# Patient Record
Sex: Female | Born: 1945
Health system: Southern US, Community
[De-identification: ages and names within clinical notes are randomized; demographics above are authoritative.]

## PROBLEM LIST (undated history)

## (undated) ENCOUNTER — Ambulatory Visit: Admission: EM | Payer: Medicare HMO

## (undated) DIAGNOSIS — I2510 Atherosclerotic heart disease of native coronary artery without angina pectoris: Secondary | ICD-10-CM

## (undated) DIAGNOSIS — L409 Psoriasis, unspecified: Secondary | ICD-10-CM

## (undated) DIAGNOSIS — I214 Non-ST elevation (NSTEMI) myocardial infarction: Secondary | ICD-10-CM

## (undated) DIAGNOSIS — I509 Heart failure, unspecified: Secondary | ICD-10-CM

## (undated) DIAGNOSIS — I447 Left bundle-branch block, unspecified: Secondary | ICD-10-CM

## (undated) DIAGNOSIS — E785 Hyperlipidemia, unspecified: Secondary | ICD-10-CM

## (undated) DIAGNOSIS — T4145XA Adverse effect of unspecified anesthetic, initial encounter: Secondary | ICD-10-CM

## (undated) DIAGNOSIS — T8859XA Other complications of anesthesia, initial encounter: Secondary | ICD-10-CM

## (undated) HISTORY — DX: Atherosclerotic heart disease of native coronary artery without angina pectoris: I25.10

## (undated) HISTORY — DX: Heart failure, unspecified: I50.9

## (undated) HISTORY — DX: Hyperlipidemia, unspecified: E78.5

## (undated) HISTORY — DX: Non-ST elevation (NSTEMI) myocardial infarction: I21.4

## (undated) HISTORY — DX: Left bundle-branch block, unspecified: I44.7

## (undated) HISTORY — PX: NO PAST SURGERIES: SHX2092

---

## 2012-02-16 ENCOUNTER — Ambulatory Visit: Payer: Self-pay

## 2012-02-16 ENCOUNTER — Inpatient Hospital Stay: Payer: Self-pay | Admitting: Student

## 2012-02-16 LAB — CBC
HGB: 11 g/dL — ABNORMAL LOW (ref 12.0–16.0)
MCH: 25.1 pg — ABNORMAL LOW (ref 26.0–34.0)
MCHC: 31.7 g/dL — ABNORMAL LOW (ref 32.0–36.0)
MCV: 79 fL — ABNORMAL LOW (ref 80–100)
Platelet: 408 10*3/uL (ref 150–440)
RBC: 4.37 10*6/uL (ref 3.80–5.20)

## 2012-02-16 LAB — URINALYSIS, COMPLETE
Bilirubin,UR: NEGATIVE
Glucose,UR: NEGATIVE mg/dL (ref 0–75)
Nitrite: NEGATIVE
Ph: 5 (ref 4.5–8.0)
Protein: >=500
Specific Gravity: 1.018 (ref 1.003–1.030)
Squamous Epithelial: 16

## 2012-02-16 LAB — APTT: Activated PTT: 26.3 secs (ref 23.6–35.9)

## 2012-02-16 LAB — TROPONIN I
Troponin-I: 0.07 ng/mL — ABNORMAL HIGH
Troponin-I: 0.1 ng/mL — ABNORMAL HIGH

## 2012-02-16 LAB — CK TOTAL AND CKMB (NOT AT ARMC): CK, Total: 176 U/L (ref 21–215)

## 2012-02-16 LAB — COMPREHENSIVE METABOLIC PANEL
Alkaline Phosphatase: 104 U/L (ref 50–136)
Bilirubin,Total: 1.1 mg/dL — ABNORMAL HIGH (ref 0.2–1.0)
Co2: 23 mmol/L (ref 21–32)
Creatinine: 0.84 mg/dL (ref 0.60–1.30)
EGFR (Non-African Amer.): >60
Osmolality: 291 (ref 275–301)
SGPT (ALT): 47 U/L (ref 12–78)
Sodium: 145 mmol/L (ref 136–145)
Total Protein: 7 g/dL (ref 6.4–8.2)

## 2012-02-16 LAB — PROTIME-INR: Prothrombin Time: 14.2 secs (ref 11.5–14.7)

## 2012-02-16 LAB — PRO B NATRIURETIC PEPTIDE: B-Type Natriuretic Peptide: 25574 pg/mL — ABNORMAL HIGH (ref 0–125)

## 2012-02-17 ENCOUNTER — Encounter (HOSPITAL_COMMUNITY): Payer: Self-pay | Admitting: Physician Assistant

## 2012-02-17 ENCOUNTER — Encounter (HOSPITAL_COMMUNITY): Payer: Self-pay | Admitting: Cardiology

## 2012-02-17 ENCOUNTER — Inpatient Hospital Stay (HOSPITAL_COMMUNITY)
Admission: EM | Admit: 2012-02-17 | Discharge: 2012-02-29 | DRG: 235 | Disposition: A | Discharge status: Home / Self Care | Payer: Medicare Other | Source: Other Acute Inpatient Hospital | Attending: Thoracic Surgery (Cardiothoracic Vascular Surgery) | Admitting: Thoracic Surgery (Cardiothoracic Vascular Surgery)

## 2012-02-17 DIAGNOSIS — D62 Acute posthemorrhagic anemia: Secondary | ICD-10-CM | POA: Diagnosis not present

## 2012-02-17 DIAGNOSIS — L408 Other psoriasis: Secondary | ICD-10-CM | POA: Diagnosis present

## 2012-02-17 DIAGNOSIS — Z951 Presence of aortocoronary bypass graft: Secondary | ICD-10-CM

## 2012-02-17 DIAGNOSIS — I255 Ischemic cardiomyopathy: Secondary | ICD-10-CM

## 2012-02-17 DIAGNOSIS — Z720 Tobacco use: Secondary | ICD-10-CM

## 2012-02-17 DIAGNOSIS — I5021 Acute systolic (congestive) heart failure: Secondary | ICD-10-CM

## 2012-02-17 DIAGNOSIS — E611 Iron deficiency: Secondary | ICD-10-CM

## 2012-02-17 DIAGNOSIS — J9819 Other pulmonary collapse: Secondary | ICD-10-CM | POA: Diagnosis not present

## 2012-02-17 DIAGNOSIS — I2510 Atherosclerotic heart disease of native coronary artery without angina pectoris: Principal | ICD-10-CM

## 2012-02-17 DIAGNOSIS — I059 Rheumatic mitral valve disease, unspecified: Secondary | ICD-10-CM | POA: Diagnosis present

## 2012-02-17 DIAGNOSIS — E162 Hypoglycemia, unspecified: Secondary | ICD-10-CM | POA: Diagnosis not present

## 2012-02-17 DIAGNOSIS — I200 Unstable angina: Secondary | ICD-10-CM | POA: Diagnosis present

## 2012-02-17 DIAGNOSIS — I340 Nonrheumatic mitral (valve) insufficiency: Secondary | ICD-10-CM

## 2012-02-17 DIAGNOSIS — I428 Other cardiomyopathies: Secondary | ICD-10-CM | POA: Diagnosis present

## 2012-02-17 DIAGNOSIS — I2589 Other forms of chronic ischemic heart disease: Secondary | ICD-10-CM | POA: Diagnosis present

## 2012-02-17 DIAGNOSIS — I509 Heart failure, unspecified: Secondary | ICD-10-CM | POA: Diagnosis present

## 2012-02-17 DIAGNOSIS — I249 Acute ischemic heart disease, unspecified: Secondary | ICD-10-CM

## 2012-02-17 DIAGNOSIS — Z8249 Family history of ischemic heart disease and other diseases of the circulatory system: Secondary | ICD-10-CM

## 2012-02-17 DIAGNOSIS — I447 Left bundle-branch block, unspecified: Secondary | ICD-10-CM

## 2012-02-17 DIAGNOSIS — F17200 Nicotine dependence, unspecified, uncomplicated: Secondary | ICD-10-CM | POA: Diagnosis present

## 2012-02-17 HISTORY — DX: Other complications of anesthesia, initial encounter: T88.59XA

## 2012-02-17 HISTORY — PX: CARDIAC CATHETERIZATION: SHX172

## 2012-02-17 HISTORY — DX: Psoriasis, unspecified: L40.9

## 2012-02-17 HISTORY — DX: Adverse effect of unspecified anesthetic, initial encounter: T41.45XA

## 2012-02-17 LAB — CBC WITH DIFFERENTIAL/PLATELET
Lymphocyte #: 1.7 10*3/uL (ref 1.0–3.6)
MCH: 25.4 pg — ABNORMAL LOW (ref 26.0–34.0)
MCHC: 32 g/dL (ref 32.0–36.0)
MCV: 79 fL — ABNORMAL LOW (ref 80–100)
Monocyte #: 0.6 x10 3/mm (ref 0.2–0.9)
Monocyte %: 7.4 %
Platelet: 339 10*3/uL (ref 150–440)
RDW: 17.8 % — ABNORMAL HIGH (ref 11.5–14.5)
WBC: 8.6 10*3/uL (ref 3.6–11.0)

## 2012-02-17 LAB — BASIC METABOLIC PANEL
BUN: 20 mg/dL — ABNORMAL HIGH (ref 7–18)
CO2: 29 mEq/L (ref 19–32)
Calcium, Total: 8.4 mg/dL — ABNORMAL LOW (ref 8.5–10.1)
Calcium: 9.3 mg/dL (ref 8.4–10.5)
Chloride: 106 mmol/L (ref 98–107)
Creatinine, Ser: 0.92 mg/dL (ref 0.50–1.10)
Osmolality: 288 (ref 275–301)
Potassium: 3.5 mmol/L (ref 3.5–5.1)

## 2012-02-17 LAB — CBC
Hemoglobin: 10.8 g/dL — ABNORMAL LOW (ref 12.0–15.0)
RBC: 4.38 MIL/uL (ref 3.87–5.11)
WBC: 10.1 10*3/uL (ref 4.0–10.5)

## 2012-02-17 LAB — URINALYSIS, MICROSCOPIC ONLY
Bilirubin Urine: NEGATIVE
Protein, ur: NEGATIVE mg/dL
Urobilinogen, UA: 1 mg/dL (ref 0.0–1.0)

## 2012-02-17 LAB — LIPID PANEL
Cholesterol: 123 mg/dL (ref 0–200)
Ldl Cholesterol, Calc: 73 mg/dL (ref 0–100)
Triglycerides: 99 mg/dL (ref 0–200)

## 2012-02-17 LAB — MAGNESIUM: Magnesium: 1.4 mg/dL — ABNORMAL LOW

## 2012-02-17 LAB — RETICULOCYTES: Retic Count, Absolute: 96.4 10*3/uL (ref 19.0–186.0)

## 2012-02-17 LAB — CK TOTAL AND CKMB (NOT AT ARMC): CK, Total: 168 U/L (ref 21–215)

## 2012-02-17 LAB — PRO B NATRIURETIC PEPTIDE: Pro B Natriuretic peptide (BNP): 17774 pg/mL — ABNORMAL HIGH (ref 0–125)

## 2012-02-17 LAB — MRSA PCR SCREENING: MRSA by PCR: NEGATIVE

## 2012-02-17 MED ORDER — ACETAMINOPHEN 325 MG PO TABS: 650.0000 mg | ORAL_TABLET | ORAL | Status: DC | PRN

## 2012-02-17 MED ORDER — FERROUS SULFATE 325 (65 FE) MG PO TABS
325.0000 mg | ORAL_TABLET | Freq: Three times a day (TID) | ORAL | Status: DC
  Administered 2012-02-17 – 2012-02-29 (×30): 325 mg via ORAL
  Filled 2012-02-17 (×38): qty 1

## 2012-02-17 MED ORDER — SODIUM CHLORIDE 0.9 % IV SOLN: 250.0000 mL | INTRAVENOUS | Status: DC | PRN

## 2012-02-17 MED ORDER — SODIUM CHLORIDE 0.9 % IJ SOLN: 3.0000 mL | INTRAMUSCULAR | Status: DC | PRN

## 2012-02-17 MED ORDER — LISINOPRIL 10 MG PO TABS
10.0000 mg | ORAL_TABLET | Freq: Every day | ORAL | Status: DC
  Administered 2012-02-17 – 2012-02-22 (×6): 10 mg via ORAL
  Filled 2012-02-17 (×7): qty 1

## 2012-02-17 MED ORDER — ONDANSETRON HCL 4 MG/2ML IJ SOLN: 4.0000 mg | Freq: Four times a day (QID) | INTRAMUSCULAR | Status: DC | PRN

## 2012-02-17 MED ORDER — FUROSEMIDE 40 MG PO TABS
40.0000 mg | ORAL_TABLET | Freq: Every day | ORAL | Status: DC
  Administered 2012-02-18 – 2012-02-19 (×2): 40 mg via ORAL
  Filled 2012-02-17 (×2): qty 1

## 2012-02-17 MED ORDER — CARVEDILOL 3.125 MG PO TABS
3.1250 mg | ORAL_TABLET | Freq: Two times a day (BID) | ORAL | Status: DC
  Administered 2012-02-18 – 2012-02-22 (×9): 3.125 mg via ORAL
  Filled 2012-02-17 (×13): qty 1

## 2012-02-17 MED ORDER — ASPIRIN EC 81 MG PO TBEC
81.0000 mg | DELAYED_RELEASE_TABLET | Freq: Every day | ORAL | Status: DC
  Administered 2012-02-18 – 2012-02-22 (×5): 81 mg via ORAL
  Filled 2012-02-17 (×6): qty 1

## 2012-02-17 MED ORDER — SODIUM CHLORIDE 0.9 % IJ SOLN
3.0000 mL | Freq: Two times a day (BID) | INTRAMUSCULAR | Status: DC
  Administered 2012-02-17 – 2012-02-22 (×9): 3 mL via INTRAVENOUS

## 2012-02-17 NOTE — H&P (Signed)
History and Physical  Patient ID: Dana [name] MRN: [id number], DOB: 03-04-1946 Date of Encounter: 02/17/2012, 5:21 PM Primary Physician: No primary provider on file. Primary Cardiologist: New to [facility]  Chief Complaint: SOB  HPI: Ms. Dana [name] is a 66 y/o F with h/o psoriasis but no prior cardiac hx. 2 weeks ago she began developing head congestion & sinus drainage and thought she just had a sinus infection. However, she began to develop progressive SOB and orthopnea for the past several days. She denies any chest pain either prior to admission or with exertion. She has not had cough or fever or markedly elevated blood pressures. She initially went to Urgent Care but was noted to be in CHF and was transferred to [facility] where BNP was 25,000, mildly elevated troponin 0.07, and CXR showing CHF. Echo showed EF <25%. She was subsequently cathed by Dr. Mariah Milling demonstrating moderately high grade Cx disease. She also has a high grade complex bifurcation LAD lesion with very large diagonal. Initially the plan was to transfer her to [facility] for intervention but now it is thought that she should be evaluated by TCTS after Dr. Riley Kill has reviewed the films. She is still somewhat SOB and unable to lay flat on her back for long periods of time. She took 4 baby aspirin at [facility] prior to arrival.  Past Medical History  Diagnosis Date  . Psoriasis      Most Recent Cardiac Studies: 2D Echo 02/17/12 at [place] showed LV EF<25%. Severe global HK. RV systolic fcn normal. Mild-mod MR. Mild-mod TR, RVSP 50-34mmHg.    Home Meds: Prior to Admission medications   Not on File    Allergies: Pt denies any allergies  History   Social History  . Marital Status: Divorced    Spouse Name: N/A    Number of Children: N/A  . Years of Education: N/A   Occupational History  . Not on file.   Social History Main Topics  . Smoking status: Current Some Day Smoker  . Smokeless tobacco: Not on file   Comment: Smoked but cut down when started to get sick  . Alcohol Use: No  . Drug Use: No  . Sexually Active: Not on file   Other Topics Concern  . Not on file   Social History Narrative  . No narrative on file     Family History  Problem Relation Age of Onset  . Heart disease Father     Review of Systems: General: negative for chills, fever, night sweats or weight changes.  Cardiovascular: see above Dermatological: psoriatic rash present Respiratory: see above Urologic: negative for hematuria Abdominal: negative for nausea, vomiting, diarrhea, bright red blood per rectum, melena, or hematemesis Neurologic: negative for visual changes, syncope, or dizziness All other systems reviewed and are otherwise negative except as noted above.  Labs: Labs: glucose 127, BUN 16, Cr 0.84, Na 145, K 3.7, chloride 111, CO2 23, Ca 8.6, Tbili 1.1, AP 104, AST 35, ALT 47, TProtein 7, albumin 3.6, osmolality 291, PT 14.2, INR 1.1. BNP [id number].  Troponin 0.07, 0.10, 0.11,. TSH 4.73 high, T4 8.1 (normal, Free T3 normal). Mg 1.4. Hgb a1c 5.3. Tchol 123, trig 99, HDL 30.  CBC 10/17:  WBC 9.3, Hgb 11, Hct 34.6, Plt 408  CBC 10/18: WBC 8.6, Hgb 9.9, Hct 31, Plt 4339.   Radiology/Studies:  2D Echo 02/17/12 showed LV EF<25%. Severe global HK. RV systolic fcn normal. Mild-mod MR. Mild-mod TR, RVSP 50-69mmHg.  CXR: reportedly done as OP  showed CHF   EKG: sinus tach 106bpm LBBB nonspecific ST T changes  Physical Exam: RR 16, P86, POX 99%, BP 151/100 General: Well developed, well nourished F in no acute distress. Head: Normocephalic, atraumatic, sclera non-icteric, no xanthomas, nares are without discharge.  Neck: JVD not elevated. Lungs: Clear bilaterally to auscultation anteriorly without wheezes, rales, or rhonchi. Breathing is unlabored. Heart: Distant heart sounds, RRR with probable S4 gallop. Abdomen: Soft, non-tender, non-distended with normoactive bowel sounds. No hepatomegaly. No  rebound/guarding. No obvious abdominal masses. Msk:  Strength and tone appear normal for age. Extremities: No clubbing or cyanosis. Tr LLE edema.  Distal pedal pulses are 2+ and equal bilaterally. Psoriatic skin changes. Neuro: Alert and oriented X 3. No focal deficit. No facial asymmetry. Moves all extremities spontaneously. Psych:  Responds to questions appropriately with a normal affect.    ASSESSMENT AND PLAN:  1. Acute newly diagnosed systolic CHF 2. CAD as outlined above - for eval by TCTS 3. Anemia, uncertain duration 4. Hypomagnesemia at [place] 5. Psoriasis 6. Hematuria at [facility]  Patient seen and examined with Dr. Riley Kill. He has spoken to TCTS. Will admit to CCU, place on ASA 81mg  daily starting tomorrow, Coreg 3.125mg  BID starting tomorrow, Lisinopril 10mg  daily starting tonight, Lasix 40mg  PO daily starting tomorrow. She has already put out 500cc post-cath. Will use SCDs given her anemia. Will repeat CBC given downtrend at [facility] and also check stools, anemia panel, and repeat UA with urine culture. Add iron FeSO4 325mg  TIDWC. Will KVO fluids and watch I/O and daily weights. Follow labs in AM.  Signed, Dayna Dunn PA-C 02/17/2012, 5:21 PM  Patient seen and examined.  I have personally supervised Ms. Dunn and agree with her evaluation.    Films reviewed in detail with Dr. Eldridge Dace, and Dr. [place].  The patient presents with sinusitis type symptoms that then led to dyspnea.  She was found to have severe LV dysfunction, coupled with very high BNP.   Her EF is in the 20-25% range.  She denies any antecedent chest pain.  Her exam reveals an S4 and paradoxical S2.  [name]  Her abdomen is soft.  Lung fields are limited to anterior exam as her femoral sheath was removed.  ECG with LBBB.   We are in consensus agreement that the LAD is a complex bifurcation lesion with additional CFX disease, with severe LV dysfunction.  The angle of bifurcation is narrow, and the diagonal is extremely large  and critically important.  Her long term anatomy, especially in light of severe LV dysfunction, would be likely best served by IMA implantation, and we agree this is the best strategy.  She incidentally likely has an iron deficiency anemia with low MCV.    We will get a TCTS consult.    1.  Check stools 2.  CBC and check iron.  3.  Diuresis 4.  Start ACE and beta blocker.  5.  Surgical consult. 6.  subcu lovenox only.    Discussed and diagramed for patient in detail.

## 2012-02-18 ENCOUNTER — Encounter (HOSPITAL_COMMUNITY): Payer: Self-pay | Admitting: *Deleted

## 2012-02-18 DIAGNOSIS — I200 Unstable angina: Secondary | ICD-10-CM

## 2012-02-18 DIAGNOSIS — I255 Ischemic cardiomyopathy: Secondary | ICD-10-CM

## 2012-02-18 DIAGNOSIS — E611 Iron deficiency: Secondary | ICD-10-CM

## 2012-02-18 DIAGNOSIS — I059 Rheumatic mitral valve disease, unspecified: Secondary | ICD-10-CM

## 2012-02-18 DIAGNOSIS — Z720 Tobacco use: Secondary | ICD-10-CM

## 2012-02-18 DIAGNOSIS — I2589 Other forms of chronic ischemic heart disease: Secondary | ICD-10-CM

## 2012-02-18 DIAGNOSIS — I447 Left bundle-branch block, unspecified: Secondary | ICD-10-CM

## 2012-02-18 DIAGNOSIS — I509 Heart failure, unspecified: Secondary | ICD-10-CM

## 2012-02-18 DIAGNOSIS — I5021 Acute systolic (congestive) heart failure: Secondary | ICD-10-CM

## 2012-02-18 DIAGNOSIS — I249 Acute ischemic heart disease, unspecified: Secondary | ICD-10-CM

## 2012-02-18 DIAGNOSIS — I2510 Atherosclerotic heart disease of native coronary artery without angina pectoris: Secondary | ICD-10-CM

## 2012-02-18 LAB — URINE CULTURE: Culture: NO GROWTH

## 2012-02-18 LAB — HEPATIC FUNCTION PANEL
ALT: 25 U/L (ref 0–35)
AST: 20 U/L (ref 0–37)
Bilirubin, Direct: 0.2 mg/dL (ref 0.0–0.3)
Total Bilirubin: 0.7 mg/dL (ref 0.3–1.2)

## 2012-02-18 LAB — CBC
HCT: 32.4 % — ABNORMAL LOW (ref 36.0–46.0)
MCV: 81 fL (ref 78.0–100.0)
Platelets: 306 10*3/uL (ref 150–400)
RBC: 4 MIL/uL (ref 3.87–5.11)
WBC: 7 10*3/uL (ref 4.0–10.5)

## 2012-02-18 LAB — BASIC METABOLIC PANEL
BUN: 18 mg/dL (ref 6–23)
Chloride: 102 mEq/L (ref 96–112)
Glucose, Bld: 92 mg/dL (ref 70–99)
Potassium: 3.1 mEq/L — ABNORMAL LOW (ref 3.5–5.1)

## 2012-02-18 LAB — PROTIME-INR: INR: 1.11 (ref 0.00–1.49)

## 2012-02-18 LAB — IRON AND TIBC
Iron: 28 ug/dL — ABNORMAL LOW (ref 42–135)
UIBC: 431 ug/dL — ABNORMAL HIGH (ref 125–400)

## 2012-02-18 LAB — FERRITIN: Ferritin: 16 ng/mL (ref 10–291)

## 2012-02-18 MED ORDER — POTASSIUM CHLORIDE CRYS ER 20 MEQ PO TBCR
40.0000 meq | EXTENDED_RELEASE_TABLET | Freq: Once | ORAL | Status: AC
  Administered 2012-02-18: 40 meq via ORAL

## 2012-02-18 MED ORDER — INFLUENZA VIRUS VACC SPLIT PF IM SUSP
0.5000 mL | INTRAMUSCULAR | Status: AC
  Filled 2012-02-18: qty 0.5

## 2012-02-18 MED ORDER — POTASSIUM CHLORIDE CRYS ER 20 MEQ PO TBCR
20.0000 meq | EXTENDED_RELEASE_TABLET | Freq: Every day | ORAL | Status: DC
  Administered 2012-02-18 – 2012-02-22 (×5): 20 meq via ORAL
  Filled 2012-02-18 (×7): qty 1

## 2012-02-18 MED ORDER — POTASSIUM CHLORIDE CRYS ER 20 MEQ PO TBCR
EXTENDED_RELEASE_TABLET | ORAL | Status: AC
  Filled 2012-02-18: qty 2

## 2012-02-18 MED ORDER — PNEUMOCOCCAL VAC POLYVALENT 25 MCG/0.5ML IJ INJ
0.5000 mL | INJECTION | INTRAMUSCULAR | Status: AC
  Filled 2012-02-18: qty 0.5

## 2012-02-18 NOTE — Plan of Care (Signed)
Problem: Phase I Progression Outcomes Goal: EF % per last Echo/documented,Core Reminder form on chart Outcome: Completed/Met Date Met:  02/18/12 Ef 20-25

## 2012-02-18 NOTE — Consult Note (Signed)
Reason for Consult:2 vessel CAD Referring Physician: Dr. Lurena Joiner [name] is an 66 y.o. female.  HPI: 66 yo with no prior cardiac history who presented with a 2 week history of SOB and leg swelling. She has a history of tobacco use and a family history of heart disease but no other known CRF. She was in her usual state of health until 2 weeks ago. She 1st noted sneezing and sinus congestion as well as congestion in her chest. She thought this was seasonal allergies, which she has every Spring and Fall. However, she began having more of a sensation of congestion in her chest and shortness of breath with exertion. Shortly thereafter she developed orthopnea and swelling in her legs L > R. She then went to urgent care and was admitted to [facility] with CHF. She ruled out for acute MI. An echo showed severe LV dysfunction with an EF of 20% and mild to moderate MR and TR. Cardiac cath showed 2 vessel CAD. She is now transferred to [facility] for further management.  Her symptoms have improved since admission.  Past Medical History  Diagnosis Date  . Psoriasis     History reviewed. No pertinent past surgical history.  Family History  Problem Relation Age of Onset  . Heart disease Father     Social History:  reports that she has been smoking.  She does not have any smokeless tobacco history on file. She reports that she does not drink alcohol or use illicit drugs.  Allergies: No Known Allergies  Medications:  Scheduled:   . aspirin EC  81 mg Oral Daily  . carvedilol  3.125 mg Oral BID WC  . ferrous sulfate  325 mg Oral TID WC  . furosemide  40 mg Oral Daily  . influenza  inactive virus vaccine  0.5 mL Intramuscular Tomorrow-1000  . lisinopril  10 mg Oral Daily  . pneumococcal 23 valent vaccine  0.5 mL Intramuscular Tomorrow-1000  . potassium chloride SA      . potassium chloride  20 mEq Oral Daily  . potassium chloride  40 mEq Oral Once  . sodium chloride  3 mL Intravenous Q12H     Results for orders placed during the hospital encounter of 02/17/12 (from the past 48 hour(s))  URINALYSIS, MICROSCOPIC ONLY     Status: Abnormal   Collection Time   02/17/12  5:10 PM      Component Value Range Comment   Color, Urine YELLOW  YELLOW    APPearance CLEAR  CLEAR    Specific Gravity, Urine 1.015  1.005 - 1.030    pH 6.5  5.0 - 8.0    Glucose, UA NEGATIVE  NEGATIVE mg/dL    Hgb urine dipstick TRACE (*) NEGATIVE    Bilirubin Urine NEGATIVE  NEGATIVE    Ketones, ur NEGATIVE  NEGATIVE mg/dL    Protein, ur NEGATIVE  NEGATIVE mg/dL    Urobilinogen, UA 1.0  0.0 - 1.0 mg/dL    Nitrite NEGATIVE  NEGATIVE    Leukocytes, UA NEGATIVE  NEGATIVE    WBC, UA 0-2  <3 WBC/hpf    RBC / HPF 0-2  <3 RBC/hpf    Squamous Epithelial / LPF RARE  RARE   MRSA PCR SCREENING     Status: Normal   Collection Time   02/17/12  7:20 PM      Component Value Range Comment   MRSA by PCR NEGATIVE  NEGATIVE   MAGNESIUM     Status: Normal  Collection Time   02/17/12  7:22 PM      Component Value Range Comment   Magnesium 1.8  1.5 - 2.5 mg/dL   PRO B NATRIURETIC PEPTIDE     Status: Abnormal   Collection Time   02/17/12  7:22 PM      Component Value Range Comment   Pro B Natriuretic peptide (BNP) 17774.0 (*) 0 - 125 pg/mL   CBC     Status: Abnormal   Collection Time   02/17/12  7:22 PM      Component Value Range Comment   WBC 10.1  4.0 - 10.5 K/uL    RBC 4.38  3.87 - 5.11 MIL/uL    Hemoglobin 10.8 (*) 12.0 - 15.0 g/dL    HCT 14.7 (*) 82.9 - 46.0 %    MCV 80.4  78.0 - 100.0 fL    MCH 24.7 (*) 26.0 - 34.0 pg    MCHC 30.7  30.0 - 36.0 g/dL    RDW 56.2 (*) 13.0 - 15.5 %    Platelets 334  150 - 400 K/uL   VITAMIN B12     Status: Normal   Collection Time   02/17/12  7:22 PM      Component Value Range Comment   Vitamin B-12 576  211 - 911 pg/mL   FOLATE     Status: Normal   Collection Time   02/17/12  7:22 PM      Component Value Range Comment   Folate >20.0     IRON AND TIBC     Status:  Abnormal   Collection Time   02/17/12  7:22 PM      Component Value Range Comment   Iron 28 (*) 42 - 135 ug/dL    TIBC 865  784 - 696 ug/dL    Saturation Ratios 6 (*) 20 - 55 %    UIBC 431 (*) 125 - 400 ug/dL   FERRITIN     Status: Normal   Collection Time   02/17/12  7:22 PM      Component Value Range Comment   Ferritin 16  10 - 291 ng/mL   RETICULOCYTES     Status: Normal   Collection Time   02/17/12  7:22 PM      Component Value Range Comment   Retic Ct Pct 2.2  0.4 - 3.1 %    RBC. 4.38  3.87 - 5.11 MIL/uL    Retic Count, Manual 96.4  19.0 - 186.0 K/uL   BASIC METABOLIC PANEL     Status: Abnormal   Collection Time   02/17/12  7:22 PM      Component Value Range Comment   Sodium 141  135 - 145 mEq/L    Potassium 4.0  3.5 - 5.1 mEq/L    Chloride 101  96 - 112 mEq/L    CO2 29  19 - 32 mEq/L    Glucose, Bld 91  70 - 99 mg/dL    BUN 18  6 - 23 mg/dL    Creatinine, Ser 2.95  0.50 - 1.10 mg/dL    Calcium 9.3  8.4 - 28.4 mg/dL    GFR calc non Af Amer 63 (*) >90 mL/min    GFR calc Af Amer 72 (*) >90 mL/min   BASIC METABOLIC PANEL     Status: Abnormal   Collection Time   02/18/12  5:15 AM      Component Value Range Comment   Sodium 141  135 -  145 mEq/L    Potassium 3.1 (*) 3.5 - 5.1 mEq/L DELTA CHECK NOTED   Chloride 102  96 - 112 mEq/L    CO2 31  19 - 32 mEq/L    Glucose, Bld 92  70 - 99 mg/dL    BUN 18  6 - 23 mg/dL    Creatinine, Ser 1.61  0.50 - 1.10 mg/dL    Calcium 8.6  8.4 - 09.6 mg/dL    GFR calc non Af Amer 66 (*) >90 mL/min    GFR calc Af Amer 76 (*) >90 mL/min   PROTIME-INR     Status: Normal   Collection Time   02/18/12  5:15 AM      Component Value Range Comment   Prothrombin Time 14.2  11.6 - 15.2 seconds    INR 1.11  0.00 - 1.49   CBC     Status: Abnormal   Collection Time   02/18/12  5:15 AM      Component Value Range Comment   WBC 7.0  4.0 - 10.5 K/uL    RBC 4.00  3.87 - 5.11 MIL/uL    Hemoglobin 9.8 (*) 12.0 - 15.0 g/dL    HCT 04.5 (*) 40.9 -  46.0 %    MCV 81.0  78.0 - 100.0 fL    MCH 24.5 (*) 26.0 - 34.0 pg    MCHC 30.2  30.0 - 36.0 g/dL    RDW 81.1 (*) 91.4 - 15.5 %    Platelets 306  150 - 400 K/uL   HEPATIC FUNCTION PANEL     Status: Abnormal   Collection Time   02/18/12  5:15 AM      Component Value Range Comment   Total Protein 5.7 (*) 6.0 - 8.3 g/dL    Albumin 2.9 (*) 3.5 - 5.2 g/dL    AST 20  0 - 37 U/L    ALT 25  0 - 35 U/L    Alkaline Phosphatase 73  39 - 117 U/L    Total Bilirubin 0.7  0.3 - 1.2 mg/dL    Bilirubin, Direct 0.2  0.0 - 0.3 mg/dL    Indirect Bilirubin 0.5  0.3 - 0.9 mg/dL     No results found.  Review of Systems  Constitutional: Positive for malaise/fatigue. Negative for fever.  HENT:       Sneezing, sinus congestion  Eyes: Negative.   Respiratory: Positive for cough, sputum production and shortness of breath.   Cardiovascular: Positive for orthopnea and leg swelling (left > right). Negative for chest pain, palpitations and claudication.  Gastrointestinal: Negative.   Genitourinary: Negative.   Skin: Positive for rash (psoriasis).  Neurological: Negative.   Endo/Heme/Allergies: Negative.   All other systems reviewed and are negative.   Blood pressure 111/74, pulse 74, temperature 97.4 F (36.3 [name]), temperature source Oral, resp. rate 20, height 5\' 1"  (1.549 m), weight 146 lb 13.2 oz (66.6 kg), SpO2 95.00%. Physical Exam  Vitals reviewed. Constitutional: She is oriented to person, place, and time. She appears well-developed and well-nourished. No distress.  HENT:  Head: Normocephalic and atraumatic.  Eyes: EOM are normal. Pupils are equal, round, and reactive to light.  Neck: Normal range of motion. Neck supple. JVD present. No thyromegaly present.       No bruits  Cardiovascular: Normal rate, regular rhythm and intact distal pulses.  Exam reveals gallop.   Murmur (2/6 systolic) heard. Respiratory: No respiratory distress. She has no wheezes. She has rales.  GI: Soft.  There is no  tenderness.  Musculoskeletal: She exhibits no edema.  Lymphadenopathy:    She has no cervical adenopathy.  Neurological: She is alert and oriented to person, place, and time.       No motor deficits  Skin: Skin is warm and dry. Rash noted.       Psoriasis left leg  Psychiatric: She has a normal mood and affect.    Assessment/Plan: 66 yo woman presents with decompensated CHF. She has severe 2 vessel CAD involving the LAD and circumflex. She also has a dilated cardiomyopathy with an EF of 20% with mild to moderate MR and TR. There may be an ischemic component to her cardiomyopathy but she is globally hypokinetic including areas where there are no flow limiting stenoses.  Her LAD lesion involves the takeoff of a large diagonal branch(about equal in size to the LAD). These vessels are both good targets for grafting. Her circumflex disease is a little trickier with the lesion involving the LCx high in the AV groove compromising the most distal OM which is very small at a site where it might be graftable. IMO this vessel would be better approached with PTCA and stenting. This raises the possibility of a staged hybrid procedure with CABG of LAD and diagonal followed in the early postop period by PTCA of the circumflex- will discuss this possibility with Dr. Riley Kill.  Another consideration for surgery are the MR and TR, those valves would have to evaluated and may require rings at time of surgery. Finally, another issue raised by Dr. Johney Frame is the LBBB and the possibility of placing LV leads at the time of surgery, which I think is a good idea.  She does need continued medical optimization prior to surgical intervention  Dana [name] 02/18/2012, 6:19 PM

## 2012-02-18 NOTE — Progress Notes (Signed)
UR completed 

## 2012-02-18 NOTE — Progress Notes (Addendum)
   SUBJECTIVE: The patient is doing well today.  At this time, she denies chest pain, shortness of breath, or any new concerns.  Her orthopnea is much improved.     [name] aspirin EC  81 mg Oral Daily  . carvedilol  3.125 mg Oral BID WC  . ferrous sulfate  325 mg Oral TID WC  . furosemide  40 mg Oral Daily  . influenza  inactive virus vaccine  0.5 mL Intramuscular Tomorrow-1000  . lisinopril  10 mg Oral Daily  . pneumococcal 23 valent vaccine  0.5 mL Intramuscular Tomorrow-1000  . sodium chloride  3 mL Intravenous Q12H      OBJECTIVE: Physical Exam: Filed Vitals:   02/18/12 0430 02/18/12 0500 02/18/12 0600 02/18/12 0700  BP:  117/65  131/63  Pulse:  68 64 66  Temp:      TempSrc:      Resp:      Height:      Weight: 147 lb 7.8 oz (66.9 kg)     SpO2:  95% 100% 100%    Intake/Output Summary (Last 24 hours) at 02/18/12 0931 Last data filed at 02/17/12 2200  Gross per 24 hour  Intake    250 ml  Output   1550 ml  Net  -1300 ml    Telemetry reveals sinus rhythm with LBBB  GEN- The patient is well appearing, alert and oriented x 3 today.   Head- normocephalic, atraumatic Eyes-  Sclera clear, conjunctiva pink Ears- hearing intact Oropharynx- clear Neck- supple, no JVP Lymph- no cervical lymphadenopathy Lungs- few basilar rales Heart- Regular rate and rhythm, paradoxical S2 split, no murmurs, rubs or gallops, PMI not laterally displaced GI- soft, NT, ND, + BS Extremities- no clubbing, cyanosis, or edema   LABS: Basic Metabolic Panel:  Basename 02/18/12 0515 02/17/12 1922  NA 141 141  K 3.1* 4.0  CL 102 101  CO2 31 29  GLUCOSE 92 91  BUN 18 18  CREATININE 0.88 0.92  CALCIUM 8.6 9.3  MG -- 1.8  PHOS -- --   Liver Function Tests:  [facility] 02/18/12 0515  AST 20  ALT 25  ALKPHOS 73  BILITOT 0.7  PROT 5.7*  ALBUMIN 2.9*   No results found for this basename: LIPASE:2,AMYLASE:2 in the last 72 hours CBC:  Basename 02/18/12 0515 02/17/12 1922  WBC 7.0 10.1    NEUTROABS -- --  HGB 9.8* 10.8*  HCT 32.4* 35.2*  MCV 81.0 80.4  PLT 306 334   Anemia Panel:  Basename 02/17/12 1922  VITAMINB12 576  FOLATE >20.0  FERRITIN 16  TIBC 459  IRON 28*  RETICCTPCT 2.2   ASSESSMENT AND PLAN:  1. CAD- pt with critical CAD, awaiting TCTS consultation for possible revascularization Clinically stable at this point Continue current medical management  2. Ischemic CM/ acute systolic dysfunction- volume overload is improving Continue diuresis Starting  coreg today  3. Iron deficiency anemia Has been placed on iron Hold heparin at this time.  4. LBBB If she goes for CABG, given reduced EF and LBBB I would recommend that she have an epicardial LV lead placed.  IF EF remains depressed > 90 days post revascularization then CRT-D would be reasonable at that time.  5. Tobacco use- cessation strongly advised  Will transfer to telemetry  Hillis Range, MD 02/18/2012 9:31 AM

## 2012-02-18 NOTE — Progress Notes (Signed)
02/18/12 0900  Mobility  Activity Ambulate in room;Bathroom privileges   patient ambulate to bathroom. Gait steady, pt ambulating in room with no complaints of shob or pain. Groin level zero.pt acknowledges need to tell rn if pain or discomfort. Will continue to monitor and advise attending as needed.

## 2012-02-19 DIAGNOSIS — I447 Left bundle-branch block, unspecified: Secondary | ICD-10-CM

## 2012-02-19 LAB — CBC
HCT: 37.9 % (ref 36.0–46.0)
Hemoglobin: 11.7 g/dL — ABNORMAL LOW (ref 12.0–15.0)
MCH: 25.6 pg — ABNORMAL LOW (ref 26.0–34.0)
MCHC: 30.9 g/dL (ref 30.0–36.0)

## 2012-02-19 LAB — BASIC METABOLIC PANEL
BUN: 22 mg/dL (ref 6–23)
BUN: 22 mg/dL (ref 6–23)
CO2: 31 mEq/L (ref 19–32)
Calcium: 9.4 mg/dL (ref 8.4–10.5)
GFR calc non Af Amer: 71 mL/min — ABNORMAL LOW (ref >90)
Glucose, Bld: 101 mg/dL — ABNORMAL HIGH (ref 70–99)
Glucose, Bld: 73 mg/dL (ref 70–99)
Potassium: 4.2 mEq/L (ref 3.5–5.1)
Sodium: 142 mEq/L (ref 135–145)

## 2012-02-19 MED ORDER — ENOXAPARIN SODIUM 40 MG/0.4ML ~~LOC~~ SOLN
40.0000 mg | SUBCUTANEOUS | Status: DC
  Administered 2012-02-19 – 2012-02-22 (×4): 40 mg via SUBCUTANEOUS
  Filled 2012-02-19 (×6): qty 0.4

## 2012-02-19 MED ORDER — NICOTINE 7 MG/24HR TD PT24
7.0000 mg | MEDICATED_PATCH | Freq: Every day | TRANSDERMAL | Status: DC
  Administered 2012-02-19 – 2012-02-29 (×10): 7 mg via TRANSDERMAL
  Filled 2012-02-19 (×11): qty 1

## 2012-02-19 MED ORDER — ATORVASTATIN CALCIUM 80 MG PO TABS
80.0000 mg | ORAL_TABLET | Freq: Every day | ORAL | Status: DC
  Administered 2012-02-19 – 2012-02-28 (×6): 80 mg via ORAL
  Filled 2012-02-19 (×11): qty 1

## 2012-02-19 MED ORDER — FUROSEMIDE 10 MG/ML IJ SOLN
40.0000 mg | Freq: Once | INTRAMUSCULAR | Status: AC
  Administered 2012-02-19: 40 mg via INTRAVENOUS

## 2012-02-19 NOTE — Progress Notes (Addendum)
Subjective: No CP or SOB Objective: Filed Vitals:   02/18/12 0900 02/18/12 1137 02/18/12 2019 02/19/12 0543  BP: 119/60 111/74 106/70 95/63  Pulse: 75 74 80 83  Temp:  97.4 F (36.3 C) 98.4 F (36.9 C) 98 F (36.7 C)  TempSrc:  Oral Oral Oral  Resp:  20 18 18   Height:  5\' 1"  (1.549 m)    Weight:  146 lb 13.2 oz (66.6 kg)  145 lb 9.6 oz (66.044 kg)  SpO2: 98% 95% 93% 99%   Weight change: -1 lb 1.6 oz (-0.5 kg)  Intake/Output Summary (Last 24 hours) at 02/19/12 4540 Last data filed at 02/18/12 2000  Gross per 24 hour  Intake    970 ml  Output    600 ml  Net    370 ml    General: Alert, awake, oriented x3, in no acute distress Neck:  JVP is normal Heart: Regular rate and rhythm, without murmurs, rubs, gallops.  Lungs: Clear to auscultation.  No rales or wheezes. Exemities:  No edema.   Neuro: Grossly intact, nonfocal.  Tele:  SR Lab Results: Results for orders placed during the hospital encounter of 02/17/12 (from the past 24 hour(s))  BASIC METABOLIC PANEL     Status: Abnormal   Collection Time   02/19/12  5:55 AM      Component Value Range   Sodium 142  135 - 145 mEq/L   Potassium 4.1  3.5 - 5.1 mEq/L   Chloride 103  96 - 112 mEq/L   CO2 31  19 - 32 mEq/L   Glucose, Bld 101 (*) 70 - 99 mg/dL   BUN 22  6 - 23 mg/dL   Creatinine, Ser 9.81  0.50 - 1.10 mg/dL   Calcium 9.4  8.4 - 19.1 mg/dL   GFR calc non Af Amer 65 (*) >90 mL/min   GFR calc Af Amer 75 (*) >90 mL/min    Studies/Results: @[id number] @  Medications: Reviewed.   Patient Active Hospital Problem List: Acute coronary syndrome (02/18/2012)   Assessment: patient with critical CAD.  Seen by S. [name] yesterday.  He will review with T Stuckey possiblity of CABG to LAD and Diag folllowed by PTCA of LCx  LCx with high grade lesion in AV groove that would be difficult to graft.    Cardiomyopathy, ischemic (02/18/2012)   Assessment:  LVEF is 20%.  Mild to moderate TR and MR   Surgery would recomm rings     Would switch po lasix to  IV lasix x1  Follow response  BNP was 17,774 on 10/18.  LBBB.  As per J Allred yesterday with reduced LVEF and LBBB recom epicardial LV lead placement.  If EF depressed after 90 days reasonable for  CRT-D. Tobacco abuse (02/18/2012)   Assessment:  Lipids  Patient is not on a statin  Will start lipitor 80 and check in AM  LOS: 2 days   Dietrich Pates 02/19/2012, 7:28 AM

## 2012-02-20 ENCOUNTER — Inpatient Hospital Stay (HOSPITAL_COMMUNITY): Payer: Medicare Other

## 2012-02-20 ENCOUNTER — Encounter: Payer: Self-pay | Admitting: Cardiovascular Disease

## 2012-02-20 ENCOUNTER — Other Ambulatory Visit: Payer: Self-pay

## 2012-02-20 DIAGNOSIS — I2510 Atherosclerotic heart disease of native coronary artery without angina pectoris: Secondary | ICD-10-CM

## 2012-02-20 LAB — BASIC METABOLIC PANEL
CO2: 31 mEq/L (ref 19–32)
Calcium: 9.5 mg/dL (ref 8.4–10.5)
Creatinine, Ser: 0.94 mg/dL (ref 0.50–1.10)
Glucose, Bld: 103 mg/dL — ABNORMAL HIGH (ref 70–99)

## 2012-02-20 LAB — PULMONARY FUNCTION TEST

## 2012-02-20 LAB — LIPID PANEL
LDL Cholesterol: 85 mg/dL (ref 0–99)
VLDL: 29 mg/dL (ref 0–40)

## 2012-02-20 LAB — GLUCOSE, CAPILLARY

## 2012-02-20 LAB — POCT ACTIVATED CLOTTING TIME: Activated Clotting Time: 120 seconds

## 2012-02-20 MED ORDER — FUROSEMIDE 20 MG PO TABS
20.0000 mg | ORAL_TABLET | Freq: Every day | ORAL | Status: DC
  Administered 2012-02-20 – 2012-02-22 (×3): 20 mg via ORAL
  Filled 2012-02-20 (×4): qty 1

## 2012-02-20 MED ORDER — ALBUTEROL SULFATE (5 MG/ML) 0.5% IN NEBU
2.5000 mg | INHALATION_SOLUTION | Freq: Once | RESPIRATORY_TRACT | Status: AC
  Administered 2012-02-20: 2.5 mg via RESPIRATORY_TRACT

## 2012-02-20 NOTE — Progress Notes (Signed)
Patient Name: Dana [name] Date of Encounter: 02/20/2012     Active Problems:  Acute coronary syndrome  Cardiomyopathy, ischemic  Acute systolic CHF (congestive heart failure)  Tobacco abuse  Left bundle branch block  Iron deficiency    SUBJECTIVE  She is markedly improved from admission.  No chest pain.  Symptoms were gradual in onset, but much better now. Wt. Down.  Case discussed with Dr. Rexene Edison.    CURRENT MEDS    . aspirin EC  81 mg Oral Daily  . atorvastatin  80 mg Oral q1800  . carvedilol  3.125 mg Oral BID WC  . enoxaparin (LOVENOX) injection  40 mg Subcutaneous Q24H  . ferrous sulfate  325 mg Oral TID WC  . furosemide  40 mg Intravenous Once  . furosemide  20 mg Oral Daily  . influenza  inactive virus vaccine  0.5 mL Intramuscular Tomorrow-1000  . lisinopril  10 mg Oral Daily  . nicotine  7 mg Transdermal Daily  . pneumococcal 23 valent vaccine  0.5 mL Intramuscular Tomorrow-1000  . potassium chloride  20 mEq Oral Daily  . sodium chloride  3 mL Intravenous Q12H  . DISCONTD: furosemide  40 mg Oral Daily    OBJECTIVE  Filed Vitals:   02/19/12 1527 02/19/12 1734 02/19/12 2206 02/20/12 0617  BP: 123/66 98/69 98/72  102/53  Pulse: 74  79 71  Temp: 97.7 F (36.5 C)  97.2 F (36.2 C) 98.1 F (36.7 C)  TempSrc: Oral  Oral Oral  Resp: 19  18 18   Height:      Weight:    143 lb 8 oz (65.091 kg)  SpO2: 98%  100% 96%    Intake/Output Summary (Last 24 hours) at 02/20/12 1023 Last data filed at 02/20/12 0800  Gross per 24 hour  Intake    940 ml  Output   1725 ml  Net   -785 ml   Filed Weights   02/18/12 1137 02/19/12 0543 02/20/12 0617  Weight: 146 lb 13.2 oz (66.6 kg) 145 lb 9.6 oz (66.044 kg) 143 lb 8 oz (65.091 kg)    PHYSICAL EXAM  General: Pleasant, NAD. Neuro: Alert and oriented X 3. Moves all extremities spontaneously. Psych: Normal affect. HEENT:  Normal  Neck: Supple without bruits or JVD. Lungs:  Resp regular and unlabored, CTA. Heart:  RRR.  Paradoxical S2 and present S4. Gallop.  No def murmur.   Abdomen: Soft, non-tender, non-distended, BS + x 4.  Extremities: No clubbing, cyanosis or edema. DP/PT/Radials 2+ and equal bilaterally.  Accessory Clinical Findings  CBC  Basename 02/19/12 0900 02/18/12 0515  WBC 9.5 7.0  NEUTROABS -- --  HGB 11.7* 9.8*  HCT 37.9 32.4*  MCV 82.9 81.0  PLT 240 306   Basic Metabolic Panel  Basename 02/20/12 0556 02/19/12 0900 02/17/12 1922  NA 138 142 --  K 4.1 4.2 --  CL 98 102 --  CO2 31 31 --  GLUCOSE 103* 73 --  BUN 21 22 --  CREATININE 0.94 0.83 --  CALCIUM 9.5 9.8 --  MG -- -- 1.8  PHOS -- -- --   Liver Function Tests  Basename 02/18/12 0515  AST 20  ALT 25  ALKPHOS 73  BILITOT 0.7  PROT 5.7*  ALBUMIN 2.9*   No results found for this basename: LIPASE:2,AMYLASE:2 in the last 72 hours Cardiac Enzymes No results found for this basename: CKTOTAL:3,CKMB:3,CKMBINDEX:3,TROPONINI:3 in the last 72 hours BNP No components found with this basename: POCBNP:3 D-Dimer No results  found for this basename: DDIMER:2 in the last 72 hours Hemoglobin A1C No results found for this basename: HGBA1C in the last 72 hours Fasting Lipid Panel  Basename 02/20/12 0556  CHOL 152  HDL 38*  LDLCALC 85  TRIG 914  CHOLHDL 4.0  LDLDIRECT --   Thyroid Function Tests No results found for this basename: TSH,T4TOTAL,FREET3,T3FREE,THYROIDAB in the last 72 hours  TELE   Radiology/Studies  No results found.  ASSESSMENT AND PLAN  1.  Non ischemic CM of undetermined cause  -  Improved. 2.  Severe CAD with high grade bifurcational disease of the LAD.    Plan  1.  TEE Wed 2.  Prob surgery Thurs 3.  Ambulate 4.  Start oral furosemide.   5.  Recheck BMET tomorrow.    Signed, Shawnie Pons MD, [facility], FSCAI

## 2012-02-20 NOTE — Progress Notes (Signed)
Procedure(s) (LRB): CORONARY ARTERY BYPASS GRAFTING (CABG) (N/A) MITRAL VALVE REPAIR (MVR) (N/A) TRICUSPID VALVE REPAIR (N/A) Subjective: No complaints, except bored being in hospital Breathing is improved  Objective: Vital signs in last 24 hours: Temp:  [97.2 F (36.2 C)-98.1 F (36.7 C)] 98.1 F (36.7 C) (10/21 0617) Pulse Rate:  [71-79] 71  (10/21 0617) Cardiac Rhythm:  [-] Heart block (10/21 0653) Resp:  [18-19] 18  (10/21 0617) BP: (98-123)/(53-72) 102/53 mmHg (10/21 0617) SpO2:  [96 %-100 %] 96 % (10/21 0617) Weight:  [143 lb 8 oz (65.091 kg)] 143 lb 8 oz (65.091 kg) (10/21 0617)  Hemodynamic parameters for last 24 hours:    Intake/Output from previous day: 10/20 0701 - 10/21 0700 In: 700 [P.O.:700] Out: 1725 [Urine:1725] Intake/Output this shift: Total I/O In: 240 [P.O.:240] Out: -   General appearance: alert and no distress Lungs: rales minimal in bases Extremities: no edema  Lab Results:  Basename 02/19/12 0900 02/18/12 0515  WBC 9.5 7.0  HGB 11.7* 9.8*  HCT 37.9 32.4*  PLT 240 306   BMET:  Basename 02/20/12 0556 02/19/12 0900  NA 138 142  K 4.1 4.2  CL 98 102  CO2 31 31  GLUCOSE 103* 73  BUN 21 22  CREATININE 0.94 0.83  CALCIUM 9.5 9.8    PT/INR:  Basename 02/18/12 0515  LABPROT 14.2  INR 1.11   ABG No results found for this basename: phart, pco2, po2, hco3, tco2, acidbasedef, o2sat   CBG (last 3)  No results found for this basename: GLUCAP:3 in the last 72 hours  Assessment/Plan: S/P Procedure(s) (LRB): CORONARY ARTERY BYPASS GRAFTING (CABG) (N/A) MITRAL VALVE REPAIR (MVR) (N/A) TRICUSPID VALVE REPAIR (N/A) 2 vessel CAD with nonischemic cardiomyopathy CHF improved with medical therapy- BNP down to 5500 D/W Dr. Riley Kill  1.She would benefit from cabg to LAD and diagonal  2.Likely can treat circumflex lesion medically  3.Will plan a TEE (Wednesday?) to reassess LV and mitral and tricuspid) prior to surgery later  in week  4. CABG  +/- MV/TV repair Thursday or Friday this week   LOS: 3 days    Dana [name] C 02/20/2012

## 2012-02-20 NOTE — Plan of Care (Signed)
Problem: Phase I Progression Outcomes Goal: Dyspnea controlled at rest (HF) Outcome: Completed/Met Date Met:  02/20/12 Pt has not had any c/o SOB, pt oob ad lib in room, goal met Goal: Up in chair, BRP Outcome: Completed/Met Date Met:  02/20/12 Pt is oob ad lib, pt steady on her feet, no c/o dizziness or sob, goal met Goal: Voiding-avoid urinary catheter unless indicated Outcome: Completed/Met Date Met:  02/20/12 Pt on bathroom privileges, voiding adequate amounts of urine, no need for foley, goal met

## 2012-02-21 DIAGNOSIS — D509 Iron deficiency anemia, unspecified: Secondary | ICD-10-CM

## 2012-02-21 DIAGNOSIS — Z01810 Encounter for preprocedural cardiovascular examination: Secondary | ICD-10-CM

## 2012-02-21 LAB — BASIC METABOLIC PANEL
Calcium: 9.4 mg/dL (ref 8.4–10.5)
Creatinine, Ser: 0.92 mg/dL (ref 0.50–1.10)
GFR calc Af Amer: 72 mL/min — ABNORMAL LOW (ref >90)

## 2012-02-21 NOTE — Progress Notes (Signed)
VASCULAR LAB PRELIMINARY  PRELIMINARY  PRELIMINARY  PRELIMINARY  Pre-op Cardiac Surgery  Carotid Findings:  Bilateral:  No evidence of hemodynamically significant internal carotid artery stenosis.   Vertebral artery flow is antegrade.     Upper Extremity Right Left  Brachial Pressures 105 Triphasic 100 Triphasic  Radial Waveforms Triphasic Triphasic  Ulnar Waveforms Triphasic Triphasic  Palmar Arch (Allen's Test) Normla Abnormal   Findings:  Doppler waveforms on the right remained normal with both radial and ulnar compressions. Doppler waveforms on the left remained normal with radial compression and reversed with ulnar compression    Lower  Extremity Right Left  Dorsalis Pedis    Anterior Tibial    Posterior Tibial    Ankle/Brachial Indices      Findings:  Palpable pedal pulses bilaterally   Dana [name], RVS 02/21/2012, 1:43 PM

## 2012-02-21 NOTE — Progress Notes (Signed)
Procedure(s) (LRB): CORONARY ARTERY BYPASS GRAFTING (CABG) (N/A) MITRAL VALVE REPAIR (MVR) (N/A) TRICUSPID VALVE REPAIR (N/A) Subjective: No complaints except bored from being in hospital  Objective: Vital signs in last 24 hours: Temp:  [97 F (36.1 C)-98.4 F (36.9 C)] 97 F (36.1 C) (10/22 1256) Pulse Rate:  [68-71] 71  (10/22 1256) Cardiac Rhythm:  [-] Heart block (10/22 0700) Resp:  [18-19] 18  (10/22 1256) BP: (100-110)/(47-65) 110/63 mmHg (10/22 1256) SpO2:  [96 %-99 %] 99 % (10/22 1256) Weight:  [144 lb 6.4 oz (65.5 kg)] 144 lb 6.4 oz (65.5 kg) (10/22 0417)  Hemodynamic parameters for last 24 hours:    Intake/Output from previous day: 10/21 0701 - 10/22 0700 In: 720 [P.O.:720] Out: 1425 [Urine:1425] Intake/Output this shift: Total I/O In: 720 [P.O.:720] Out: 825 [Urine:825]  General appearance: alert and no distress Exam unchanged  Lab Results:  Basename 02/19/12 0900  WBC 9.5  HGB 11.7*  HCT 37.9  PLT 240   BMET:  Basename 02/21/12 0530 02/20/12 0556  NA 141 138  K 4.2 4.1  CL 102 98  CO2 29 31  GLUCOSE 88 103*  BUN 22 21  CREATININE 0.92 0.94  CALCIUM 9.4 9.5    PT/INR: No results found for this basename: LABPROT,INR in the last 72 hours ABG No results found for this basename: phart, pco2, po2, hco3, tco2, acidbasedef, o2sat   CBG (last 3)  No results found for this basename: GLUCAP:3 in the last 72 hours  Assessment/Plan: S/P Procedure(s) (LRB): CORONARY ARTERY BYPASS GRAFTING (CABG) (N/A) MITRAL VALVE REPAIR (MVR) (N/A) TRICUSPID VALVE REPAIR (N/A) 2 vessel CAD For CABG +/- MVR/TVR Thursday 10/24 For TEE to assess LV and MV and TV tomorrow She has mild iron deficiency anemia- it should not delay her surgery   LOS: 4 days    Matthew Cina C 02/21/2012

## 2012-02-21 NOTE — Progress Notes (Signed)
Patient Name: Nature Kueker Date of Encounter: 02/21/2012     Active Problems:  Acute coronary syndrome  Cardiomyopathy, ischemic  Acute systolic CHF (congestive heart failure)  Tobacco abuse  Left bundle branch block  Iron deficiency    SUBJECTIVE  Stable but a little bored.  SOB is markedly improved.  Denies weight loss.  Findings are consistent with iron def anemia.    CURRENT MEDS    . albuterol  2.5 mg Nebulization Once  . aspirin EC  81 mg Oral Daily  . atorvastatin  80 mg Oral q1800  . carvedilol  3.125 mg Oral BID WC  . enoxaparin (LOVENOX) injection  40 mg Subcutaneous Q24H  . ferrous sulfate  325 mg Oral TID WC  . furosemide  20 mg Oral Daily  . lisinopril  10 mg Oral Daily  . nicotine  7 mg Transdermal Daily  . potassium chloride  20 mEq Oral Daily  . sodium chloride  3 mL Intravenous Q12H    OBJECTIVE  Filed Vitals:   02/20/12 1349 02/20/12 2025 02/21/12 0417 02/21/12 1256  BP: 104/56 100/65 106/47 110/63  Pulse: 72 69 68 71  Temp: 97.2 F (36.2 C) 98.4 F (36.9 C) 98.4 F (36.9 C) 97 F (36.1 C)  TempSrc: Axillary Oral Oral Oral  Resp: 20 19 19 18   Height:      Weight:   144 lb 6.4 oz (65.5 kg)   SpO2: 100% 96% 97% 99%    Intake/Output Summary (Last 24 hours) at 02/21/12 1330 Last data filed at 02/21/12 1258  Gross per 24 hour  Intake    960 ml  Output   2250 ml  Net  -1290 ml   Filed Weights   02/19/12 0543 02/20/12 0617 02/21/12 0417  Weight: 145 lb 9.6 oz (66.044 kg) 143 lb 8 oz (65.091 kg) 144 lb 6.4 oz (65.5 kg)    PHYSICAL EXAM  General: Pleasant, NAD. Neuro: Alert and oriented X 3. Moves all extremities spontaneously. Psych: Normal affect. HEENT:  Normal  Neck: Supple without bruits or JVD. Lungs:  Resp regular and unlabored, CTA. Heart: RRR .  PMI non displaced.  Normal S1.  Pos S4.   Abdomen: Soft, non-tender, non-distended, BS + x 4.  Extremities: No clubbing, cyanosis or edema. DP/PT/Radials 2+ and equal  bilaterally.  Accessory Clinical Findings  CBC  Basename 02/19/12 0900  WBC 9.5  NEUTROABS --  HGB 11.7*  HCT 37.9  MCV 82.9  PLT 240   Basic Metabolic Panel  Basename 02/21/12 0530 02/20/12 0556  NA 141 138  K 4.2 4.1  CL 102 98  CO2 29 31  GLUCOSE 88 103*  BUN 22 21  CREATININE 0.92 0.94  CALCIUM 9.4 9.5  MG -- --  PHOS -- --   Liver Function Tests No results found for this basename: AST:2,ALT:2,ALKPHOS:2,BILITOT:2,PROT:2,ALBUMIN:2 in the last 72 hours No results found for this basename: LIPASE:2,AMYLASE:2 in the last 72 hours Cardiac Enzymes No results found for this basename: CKTOTAL:3,CKMB:3,CKMBINDEX:3,TROPONINI:3 in the last 72 hours BNP No components found with this basename: POCBNP:3 D-Dimer No results found for this basename: DDIMER:2 in the last 72 hours Hemoglobin A1C No results found for this basename: HGBA1C in the last 72 hours Fasting Lipid Panel  Basename 02/20/12 0556  CHOL 152  HDL 38*  LDLCALC 85  TRIG 161  CHOLHDL 4.0  LDLDIRECT --   Thyroid Function Tests No results found for this basename: TSH,T4TOTAL,FREET3,T3FREE,THYROIDAB in the last 72 hours  Radiology/Studies  Dg Chest 2 View  02/20/2012  *RADIOLOGY REPORT*  Clinical Data: CHF, shortness of breath  CHEST - 2 VIEW  Comparison: None.  Findings: Lungs are clear. No pleural effusion or pneumothorax.  The heart is top normal in size.  Degenerative changes of the visualized thoracolumbar spine.  IMPRESSION: No evidence of acute cardiopulmonary disease.   Original Report Authenticated By: Charline Bills, M.D.     ASSESSMENT AND PLAN 1.  Severe cardiomyopathy, beyond underlying CAD 2.  Iron def anemia, unknown cause.  Stool pending.   3.  ?MR and TR  ---  TEE scheduled for tomorrow.     Plan  1.  TEE tomorrow 2.  I will do a rectal exam tomorrow and discuss with Dr. Dorris Fetch.  3.  GI consult.     Signed, Shawnie Pons MD, [facility], FSCAI

## 2012-02-21 NOTE — Plan of Care (Signed)
Problem: Phase I Progression Outcomes Goal: Initial discharge plan identified Outcome: Completed/Met Date Met:  02/21/12 Initial plan for d/c is to return home, goal met  Problem: Phase II Progression Outcomes Goal: Pain controlled Outcome: Completed/Met Date Met:  02/21/12 Pt has no had any c/o pain or SOB, goal met Goal: Dyspnea controlled with activity Outcome: Completed/Met Date Met:  02/21/12 Pt has not had any c/o sob with exertion, pt independent with activity, goal met Goal: Tolerating diet Outcome: Completed/Met Date Met:  02/21/12 Pt on heart healthy diet, no c/o n/v tolerating diet, pt has good appetite, goal met

## 2012-02-22 ENCOUNTER — Encounter (HOSPITAL_COMMUNITY): Payer: Self-pay | Admitting: Certified Registered"

## 2012-02-22 ENCOUNTER — Encounter (HOSPITAL_COMMUNITY)
Admission: EM | Disposition: A | Discharge status: Home / Self Care | Payer: Self-pay | Attending: Thoracic Surgery (Cardiothoracic Vascular Surgery)

## 2012-02-22 ENCOUNTER — Encounter (HOSPITAL_COMMUNITY): Payer: Self-pay | Admitting: *Deleted

## 2012-02-22 DIAGNOSIS — I059 Rheumatic mitral valve disease, unspecified: Secondary | ICD-10-CM

## 2012-02-22 DIAGNOSIS — I340 Nonrheumatic mitral (valve) insufficiency: Secondary | ICD-10-CM

## 2012-02-22 HISTORY — PX: TEE WITHOUT CARDIOVERSION: SHX5443

## 2012-02-22 LAB — SURGICAL PCR SCREEN
MRSA, PCR: NEGATIVE
Staphylococcus aureus: POSITIVE — AB

## 2012-02-22 SURGERY — ECHOCARDIOGRAM, TRANSESOPHAGEAL
Anesthesia: Moderate Sedation

## 2012-02-22 MED ORDER — MAGNESIUM SULFATE 50 % IJ SOLN
40.0000 meq | INTRAMUSCULAR | Status: DC
  Filled 2012-02-22: qty 10

## 2012-02-22 MED ORDER — MIDAZOLAM HCL 10 MG/2ML IJ SOLN
INTRAMUSCULAR | Status: DC | PRN
  Administered 2012-02-22 (×2): 2 mg via INTRAVENOUS

## 2012-02-22 MED ORDER — LIDOCAINE VISCOUS 2 % MT SOLN
OROMUCOSAL | Status: AC
  Filled 2012-02-22: qty 15

## 2012-02-22 MED ORDER — PHENYLEPHRINE HCL 10 MG/ML IJ SOLN
30.0000 ug/min | INTRAVENOUS | Status: DC
  Administered 2012-02-23: 15 ug/min via INTRAVENOUS
  Filled 2012-02-22: qty 2

## 2012-02-22 MED ORDER — FENTANYL CITRATE 0.05 MG/ML IJ SOLN
INTRAMUSCULAR | Status: AC
  Filled 2012-02-22: qty 2

## 2012-02-22 MED ORDER — VANCOMYCIN HCL 1000 MG IV SOLR
1250.0000 mg | INTRAVENOUS | Status: AC
  Administered 2012-02-23: 1250 mg via INTRAVENOUS
  Filled 2012-02-22: qty 1250

## 2012-02-22 MED ORDER — DIAZEPAM 5 MG PO TABS
5.0000 mg | ORAL_TABLET | Freq: Once | ORAL | Status: AC
  Administered 2012-02-23: 5 mg via ORAL
  Filled 2012-02-22: qty 1

## 2012-02-22 MED ORDER — FENTANYL CITRATE 0.05 MG/ML IJ SOLN
INTRAMUSCULAR | Status: DC | PRN
  Administered 2012-02-22 (×2): 25 ug via INTRAVENOUS

## 2012-02-22 MED ORDER — METOPROLOL TARTRATE 12.5 MG HALF TABLET
12.5000 mg | ORAL_TABLET | Freq: Once | ORAL | Status: AC
  Administered 2012-02-23: 12.5 mg via ORAL
  Filled 2012-02-22: qty 1

## 2012-02-22 MED ORDER — BISACODYL 5 MG PO TBEC
5.0000 mg | DELAYED_RELEASE_TABLET | Freq: Once | ORAL | Status: AC
  Administered 2012-02-23: 5 mg via ORAL
  Filled 2012-02-22: qty 1

## 2012-02-22 MED ORDER — DEXTROSE 5 % IV SOLN
1.5000 g | INTRAVENOUS | Status: AC
  Administered 2012-02-23: .75 g via INTRAVENOUS
  Administered 2012-02-23: 1.5 g via INTRAVENOUS
  Filled 2012-02-22: qty 1.5

## 2012-02-22 MED ORDER — SODIUM CHLORIDE 0.9 % IV SOLN
INTRAVENOUS | Status: AC
  Administered 2012-02-23: 1.1 [IU]/h via INTRAVENOUS
  Filled 2012-02-22: qty 1

## 2012-02-22 MED ORDER — SODIUM CHLORIDE 0.9 % IV SOLN
INTRAVENOUS | Status: AC
  Administered 2012-02-23: 69.8 mL/h via INTRAVENOUS
  Filled 2012-02-22: qty 40

## 2012-02-22 MED ORDER — SODIUM CHLORIDE 0.9 % IV SOLN
INTRAVENOUS | Status: DC
  Administered 2012-02-22: 12:00:00 via INTRAVENOUS

## 2012-02-22 MED ORDER — PLASMA-LYTE 148 IV SOLN
INTRAVENOUS | Status: DC
  Filled 2012-02-22: qty 2.5

## 2012-02-22 MED ORDER — MIDAZOLAM HCL 5 MG/ML IJ SOLN
INTRAMUSCULAR | Status: AC
  Filled 2012-02-22: qty 2

## 2012-02-22 MED ORDER — DOPAMINE-DEXTROSE 3.2-5 MG/ML-% IV SOLN
2.0000 ug/kg/min | INTRAVENOUS | Status: DC
  Administered 2012-02-23: 3 ug/kg/min via INTRAVENOUS
  Filled 2012-02-22: qty 250

## 2012-02-22 MED ORDER — POTASSIUM CHLORIDE 2 MEQ/ML IV SOLN
80.0000 meq | INTRAVENOUS | Status: DC
  Filled 2012-02-22: qty 40

## 2012-02-22 MED ORDER — CHLORHEXIDINE GLUCONATE 4 % EX LIQD
60.0000 mL | Freq: Once | CUTANEOUS | Status: AC
  Administered 2012-02-23: 4 via TOPICAL
  Filled 2012-02-22: qty 60

## 2012-02-22 MED ORDER — LIDOCAINE VISCOUS 2 % MT SOLN
OROMUCOSAL | Status: DC | PRN
  Administered 2012-02-22: 12 mL via OROMUCOSAL

## 2012-02-22 MED ORDER — EPINEPHRINE HCL 1 MG/ML IJ SOLN
0.5000 ug/min | INTRAVENOUS | Status: DC
  Filled 2012-02-22: qty 4

## 2012-02-22 MED ORDER — DEXTROSE 5 % IV SOLN
750.0000 mg | INTRAVENOUS | Status: DC
  Filled 2012-02-22: qty 750

## 2012-02-22 MED ORDER — DEXMEDETOMIDINE HCL IN NACL 400 MCG/100ML IV SOLN
0.1000 ug/kg/h | INTRAVENOUS | Status: AC
  Administered 2012-02-23: 0.3 ug/kg/h via INTRAVENOUS
  Filled 2012-02-22: qty 100

## 2012-02-22 MED ORDER — NITROGLYCERIN IN D5W 200-5 MCG/ML-% IV SOLN
2.0000 ug/min | INTRAVENOUS | Status: DC
  Administered 2012-02-23: 5 ug/min via INTRAVENOUS
  Filled 2012-02-22: qty 250

## 2012-02-22 MED ORDER — TEMAZEPAM 15 MG PO CAPS: 15.0000 mg | ORAL_CAPSULE | Freq: Once | ORAL | Status: AC | PRN

## 2012-02-22 NOTE — Progress Notes (Signed)
 Patient Name: Dana [name] Date of Encounter: 02/22/2012     Active Problems:  Acute coronary syndrome  Cardiomyopathy, ischemic  Acute systolic CHF (congestive heart failure)  Tobacco abuse  Left bundle branch block  Iron deficiency    SUBJECTIVE  Doing well.  Awaiting CABG tomorrow.  TEE today to determine severity of MR and TR and need or lack of need for ring.  Discussed with Dr. [name].   CURRENT MEDS    . aspirin EC  81 mg Oral Daily  . atorvastatin  80 mg Oral q1800  . carvedilol  3.125 mg Oral BID WC  . enoxaparin (LOVENOX) injection  40 mg Subcutaneous Q24H  . ferrous sulfate  325 mg Oral TID WC  . furosemide  20 mg Oral Daily  . lisinopril  10 mg Oral Daily  . nicotine  7 mg Transdermal Daily  . potassium chloride  20 mEq Oral Daily  . sodium chloride  3 mL Intravenous Q12H    OBJECTIVE  Filed Vitals:   02/21/12 0417 02/21/12 1256 02/21/12 2213 02/22/12 0447  BP: 106/47 110/63 97/51 90/52  Pulse: 68 71 70 79  Temp: 98.4 F (36.9 C) 97 F (36.1 C) 98 F (36.7 C) 98.1 F (36.7 C)  TempSrc: Oral Oral Oral Oral  Resp: 19 18 18 18  Height:      Weight: 144 lb 6.4 oz (65.5 kg)   143 lb 4.8 oz (65 kg)  SpO2: 97% 99% 97% 99%    Intake/Output Summary (Last 24 hours) at 02/22/12 0915 Last data filed at 02/22/12 0828  Gross per 24 hour  Intake    683 ml  Output    425 ml  Net    258 ml   Filed Weights   02/20/12 0617 02/21/12 0417 02/22/12 0447  Weight: 143 lb 8 oz (65.091 kg) 144 lb 6.4 oz (65.5 kg) 143 lb 4.8 oz (65 kg)    PHYSICAL EXAM  General: Pleasant, NAD. Neuro: Alert and oriented X 3. Moves all extremities spontaneously. Psych: Normal affect. HEENT:  Normal  Neck: Supple without bruits or JVD. Lungs:  Resp regular and unlabored, CTA. Heart: RRR no s3, s4, or murmurs. Abdomen: Soft, non-tender, non-distended, BS + x 4.  Extremities: No clubbing, cyanosis or edema. DP/PT/Radials 2+ and equal bilaterally.  Accessory  Clinical Findings  CBC No results found for this basename: WBC:2,NEUTROABS:2,HGB:2,HCT:2,MCV:2,PLT:2 in the last 72 hours Basic Metabolic Panel  Basename 02/21/12 0530 02/20/12 0556  NA 141 138  K 4.2 4.1  CL 102 98  CO2 29 31  GLUCOSE 88 103*  BUN 22 21  CREATININE 0.92 0.94  CALCIUM 9.4 9.5  MG -- --  PHOS -- --   Liver Function Tests No results found for this basename: AST:2,ALT:2,ALKPHOS:2,BILITOT:2,PROT:2,ALBUMIN:2 in the last 72 hours No results found for this basename: LIPASE:2,AMYLASE:2 in the last 72 hours Cardiac Enzymes No results found for this basename: CKTOTAL:3,CKMB:3,CKMBINDEX:3,TROPONINI:3 in the last 72 hours BNP No components found with this basename: POCBNP:3 D-Dimer No results found for this basename: DDIMER:2 in the last 72 hours Hemoglobin A1C No results found for this basename: HGBA1C in the last 72 hours Fasting Lipid Panel  Basename 02/20/12 0556  CHOL 152  HDL 38*  LDLCALC 85  TRIG 143  CHOLHDL 4.0  LDLDIRECT --   Thyroid Function Tests No results found for this basename: TSH,T4TOTAL,FREET3,T3FREE,THYROIDAB in the last 72 hours    Radiology/Studies  Dg Chest 2 View  02/20/2012  *RADIOLOGY REPORT*  Clinical   Data: CHF, shortness of breath  CHEST - 2 VIEW  Comparison: None.  Findings: Lungs are clear. No pleural effusion or pneumothorax.  The heart is top normal in size.  Degenerative changes of the visualized thoracolumbar spine.  IMPRESSION: No evidence of acute cardiopulmonary disease.   Original Report Authenticated By: SRIYESH KRISHNAN, M.D.     ASSESSMENT AND PLAN   1.  Cardiomyopathy   Mixed 2.  Severe bifurcational LAD and cfx disease. 3.  Glucose intolerance  -  Improving.   4.  Tobacco use  -  On patch.   Plan  TEE today.  Need and risks discussed.     Signed, Mossie Gilder MD, FACC, FSCAI 

## 2012-02-22 NOTE — Plan of Care (Signed)
Problem: Phase II Progression Outcomes Goal: Walk in hall or up in chair TID Outcome: Completed/Met Date Met:  02/22/12 Pt oob ad lib, no c/o c/p or sob, pt independent at home, activity baseline for pt

## 2012-02-22 NOTE — Interval H&P Note (Signed)
History and Physical Interval Note:  02/22/2012 5:18 PM  Dana [name]  has presented today for surgery, with the diagnosis of NSTEMI and preoperative valve evaluation  The various methods of treatment have been discussed with the patient and family. After consideration of risks, benefits and other options for treatment, the patient has consented to  Procedure(s) (LRB) with comments: TRANSESOPHAGEAL ECHOCARDIOGRAM (TEE) (N/A) as a surgical intervention .  The patient's history has been reviewed, patient examined, no change in status, stable for surgery.  I have reviewed the patient's chart and labs.  Questions were answered to the patient's satisfaction.     [name]

## 2012-02-22 NOTE — Consult Note (Signed)
[facility] Consult: 12:51 PM 02/22/2012   Referring Provider: Dr Riley Kill Primary Care Physician:  None, referred from urgent care in [place]. Never goes to MD. Primary Gastroenterologist: none, unassigned  Reason for Consultation:  Anemia with low Iron.  HPI: Dana [name] is a 66 y.o. female.  Only PMH is of psoriasis and tobacco abuse. Admitted 10/18 with CHF.  EF is <25%, 02/17/12 Cath shows high grade Cfx, LAD lesion.  Awaiting CABG 02/22/12.  TEE is pending to determine need for valve intervention.  Had MR and TR on transthoracic echo.   Gi eval requested for anemia, Hgb nadir of 9.8, normal MCV, Iron is low at 28, Ferritin low normal at 16.  Never had prior EGD or colonoscopy.  Long hx of anemia dating back to childhood, took iron supplements during childbearing years.  Never transfused with blood or parenteral iron.  No stomach distress, reflux, blood in stool, melena, anorexia.  No weight fluctuation.  No altered bowel habits.  Takes aleve or Ibuprofen about once per month.  No ETOH. Has extensive psoriasis which she downplays.   Never sees a doctor.      REVIEW OF SYSTEMS: Constitutional:  No weight loss or gain.  No weakness ENT:  No nose bleeds Pulm:  No cough or SOB.  Breathing is much improved CV:  Orthopnea. No limb swelling.  GU:  No dysuria, incontinence, hematuria GI:  No dysphagia, reflux sxs Heme:  As per HPI.    Transfusions:  None ever Neuro:  No  Seizure, rare headache.  No acute visual changes Derm:  Psoriasis, no Rx meds ever used for this.  Never saw a Derm specialist  Endocrine:  No hx excessive thirst, sweats, hair loss.  Immunization:  Flu shot not yet given Travel:  none  Past Medical History  Diagnosis Date  . Psoriasis     History reviewed. No pertinent past surgical history.  Prior to Admission medications   Medication Sig Start Date End Date Taking? Authorizing Provider    acetaminophen (TYLENOL) 500 MG tablet Take 1,000 mg by mouth every 6 (six) hours as needed. For pain   Yes Historical Provider, MD  ibuprofen (ADVIL,MOTRIN) 200 MG tablet Take 400 mg by mouth every 6 (six) hours as needed. For pain   Yes Historical Provider, MD    Scheduled Meds:    . aspirin EC  81 mg Oral Daily  . atorvastatin  80 mg Oral q1800  . carvedilol  3.125 mg Oral BID WC  . enoxaparin (LOVENOX) injection  40 mg Subcutaneous Q24H  . ferrous sulfate  325 mg Oral TID WC  . furosemide  20 mg Oral Daily  . lisinopril  10 mg Oral Daily  . nicotine  7 mg Transdermal Daily  . potassium chloride  20 mEq Oral Daily  . sodium chloride  3 mL Intravenous Q12H   Infusions:    . sodium chloride 50 mL/hr at 02/22/12 1202   PRN Meds: sodium chloride, ondansetron (ZOFRAN) IV, sodium chloride   Allergies as of 02/17/2012  . (No Known Allergies)    Family History  Problem Relation Age of Onset  . Heart disease Father        alzheimer's  Mom  History   Social History  . Marital Status: Divorced    Spouse Name: N/A    Number of Children: N/A  . Years of Education: N/A   Occupational History  . Not on file.   Social History Main Topics  . Smoking status: Current Some Day Smoker  . Smokeless tobacco: Not on file   Comment: Smoked but cut down when started to get sick.  About one and 1/2 packs per week  . Alcohol Use: No  . Drug Use: No  . Sexually Active: Not on file   Other Topics Concern  . Lives with her son.   Social History Narrative  . Convenience store clerk     PHYSICAL EXAM: Vital signs in last 24 hours: Temp:  [97 F (36.1 C)-98.1 F (36.7 C)] 98.1 F (36.7 C) (10/23 0447) Pulse Rate:  [70-79] 79  (10/23 0447) Resp:  [18] 18  (10/23 0447) BP: (90-110)/(51-63) 90/52 mmHg (10/23 0447) SpO2:  [97 %-99 %] 99 % (10/23 0447) Weight:  [65 kg (143 lb 4.8 oz)] 65 kg (143 lb  4.8 oz) (10/23 0447)  General: thin, tanned WF who looks slightly younger than stated age Head:  No facial edema or asymmetry Eyes:  No conj pallor. No icterus.  EOMI  Ears:  Not HOH  Nose:  No congestion or sneezing Mouth:  Caries and heavy plaque burden in multiple teeth.  No sores, no exudates. Neck: Supple without bruits or JVD. No thyromegaly Lungs:  No adventitious sounds or labored breathing.  Heart: RRR no s3, s4, or murmurs. Abdomen:  Soft, NT, ND.  No mass or HSM.  No bruits.   Rectal: greenish brown stool is FOB negative.  External hemorrhoids   Musc/Skeltl: no gross joint deformity or swelling Extremities:  No clubbing, cyanosis or edema. DP/PT/Radials 2+ and equal bilaterally. Neurologic:  No tremor.  Fully oriented.  Moves all 4 limbs. No gross deficits Skin:  Multiple large regions of psoriatic plaques on legs, arms and belly. Tattoos:  none Nodes:  No adenopathy at neck or groin   Psych:  Pleasant, cooperative.   Intake/Output from previous day: 10/22 0701 - 10/23 0700 In: 923 [P.O.:920; I.V.:3] Out: 825 [Urine:825] Intake/Output this shift:    LAB RESULTS: No results found for this basename: WBC:3,HGB:3,HCT:3,PLT:3 in the last 72 hours BMET Lab Results  Component Value Date   NA 141 02/21/2012   NA 138 02/20/2012   NA 142 02/19/2012   K 4.2 02/21/2012   K 4.1 02/20/2012   K 4.2 02/19/2012   CL 102 02/21/2012   CL 98 02/20/2012   CL 102 02/19/2012   CO2 29 02/21/2012   CO2 31 02/20/2012   CO2 31 02/19/2012   GLUCOSE 88 02/21/2012   GLUCOSE 103* 02/20/2012   GLUCOSE 73 02/19/2012   BUN 22 02/21/2012   BUN 21 02/20/2012   BUN 22 02/19/2012   CREATININE 0.92 02/21/2012   CREATININE 0.94 02/20/2012   CREATININE 0.83 02/19/2012   CALCIUM 9.4 02/21/2012   CALCIUM 9.5 02/20/2012   CALCIUM 9.8 02/19/2012   LFT No results found for this basename: PROT:3,ALBUMIN:3,AST:3,ALT:3,ALKPHOS:3,BILITOT:3,BILIDIR:3,IBILI:3 in the last 72 hours PT/INR Lab  Results  Component Value Date   INR 1.11 02/18/2012       PT                           14.2    Anemia profile  Ref. Range 02/17/2012 19:22  Iron Latest Range: 42-135 ug/dL 28 (L)  UIBC Latest Range: 125-400 ug/dL 161 (H)  TIBC Latest Range: 250-470 ug/dL 096  Saturation Ratios Latest Range: 20-55 % 6 (L)  Ferritin Latest Range: 10-291 ng/mL 16  Folate No range found >20.0    RADIOLOGY STUDIES: No results found.  ENDOSCOPIC STUDIES: Never had EGD or colonoscopy  IMPRESSION: *  Normocytic anemia.  Iron levels low.  FOB negative and no worrisome GI sxs.  Intermittent blood loss from ulcer, gastritis, neoplasia, AVMs are all in the differential dx.  She also has marked psoriasis which could be causing bone marrow suppression.  *  Ischemic cardiomyopathy, acute sxs improved *  CAD.  CABG and possible valve repair set for 10/24 *  Psoriasis.  Has never seen derm specialist.  *  Poor dentition.     PLAN: *  Will need screening colonoscopy/possible EGD in future, after recovery from CABG.  As she is heme negative and has no red flags sugg of GI bleeding, colonoscopy is not urgent. *  Check TSH, this can be a cause of anemia.     LOS: 5 days   Jennye Moccasin  02/22/2012, 12:51 PM Pager: [phone] I have reviewed the above note, examined the patient and agree with plan of treatment. Heme negative chronic iron deficiency anemia, stable hemodynamically, she  Is on OR schedule for tomorrow for CABG. I have talked to her and her son and discussed an outpatient colonoscopy, possibly EGD after she recovers from the surgery.She lives in [place] and is planning to get a PCP there.We will give her a card with GI office # and send her a reminder in about 8 weeks.  [facility] Pager # [phone]

## 2012-02-22 NOTE — Progress Notes (Signed)
Patient seen in Endoscopy suite. Awaiting TEE She feels well and denies CP or SOB For CABG, possible mitral repair, possible tricuspid repair in AM She is aware of the risks and benefits and is ready to proceed. Await results of TEE

## 2012-02-22 NOTE — H&P (View-Only) (Signed)
Patient Name: Dana [name] Date of Encounter: 02/22/2012     Active Problems:  Acute coronary syndrome  Cardiomyopathy, ischemic  Acute systolic CHF (congestive heart failure)  Tobacco abuse  Left bundle branch block  Iron deficiency    SUBJECTIVE  Doing well.  Awaiting CABG tomorrow.  TEE today to determine severity of MR and TR and need or lack of need for ring.  Discussed with Dr. Dorris Fetch.   CURRENT MEDS    . aspirin EC  81 mg Oral Daily  . atorvastatin  80 mg Oral q1800  . carvedilol  3.125 mg Oral BID WC  . enoxaparin (LOVENOX) injection  40 mg Subcutaneous Q24H  . ferrous sulfate  325 mg Oral TID WC  . furosemide  20 mg Oral Daily  . lisinopril  10 mg Oral Daily  . nicotine  7 mg Transdermal Daily  . potassium chloride  20 mEq Oral Daily  . sodium chloride  3 mL Intravenous Q12H    OBJECTIVE  Filed Vitals:   02/21/12 0417 02/21/12 1256 02/21/12 2213 02/22/12 0447  BP: 106/47 110/63 97/51 90/52   Pulse: 68 71 70 79  Temp: 98.4 F (36.9 C) 97 F (36.1 C) 98 F (36.7 C) 98.1 F (36.7 C)  TempSrc: Oral Oral Oral Oral  Resp: 19 18 18 18   Height:      Weight: 144 lb 6.4 oz (65.5 kg)   143 lb 4.8 oz (65 kg)  SpO2: 97% 99% 97% 99%    Intake/Output Summary (Last 24 hours) at 02/22/12 0915 Last data filed at 02/22/12 0828  Gross per 24 hour  Intake    683 ml  Output    425 ml  Net    258 ml   Filed Weights   02/20/12 0617 02/21/12 0417 02/22/12 0447  Weight: 143 lb 8 oz (65.091 kg) 144 lb 6.4 oz (65.5 kg) 143 lb 4.8 oz (65 kg)    PHYSICAL EXAM  General: Pleasant, NAD. Neuro: Alert and oriented X 3. Moves all extremities spontaneously. Psych: Normal affect. HEENT:  Normal  Neck: Supple without bruits or JVD. Lungs:  Resp regular and unlabored, CTA. Heart: RRR no s3, s4, or murmurs. Abdomen: Soft, non-tender, non-distended, BS + x 4.  Extremities: No clubbing, cyanosis or edema. DP/PT/Radials 2+ and equal bilaterally.  Accessory  Clinical Findings  CBC No results found for this basename: WBC:2,NEUTROABS:2,HGB:2,HCT:2,MCV:2,PLT:2 in the last 72 hours Basic Metabolic Panel  Basename 02/21/12 0530 02/20/12 0556  NA 141 138  K 4.2 4.1  CL 102 98  CO2 29 31  GLUCOSE 88 103*  BUN 22 21  CREATININE 0.92 0.94  CALCIUM 9.4 9.5  MG -- --  PHOS -- --   Liver Function Tests No results found for this basename: AST:2,ALT:2,ALKPHOS:2,BILITOT:2,PROT:2,ALBUMIN:2 in the last 72 hours No results found for this basename: LIPASE:2,AMYLASE:2 in the last 72 hours Cardiac Enzymes No results found for this basename: CKTOTAL:3,CKMB:3,CKMBINDEX:3,TROPONINI:3 in the last 72 hours BNP No components found with this basename: POCBNP:3 D-Dimer No results found for this basename: DDIMER:2 in the last 72 hours Hemoglobin A1C No results found for this basename: HGBA1C in the last 72 hours Fasting Lipid Panel  Basename 02/20/12 0556  CHOL 152  HDL 38*  LDLCALC 85  TRIG 161  CHOLHDL 4.0  LDLDIRECT --   Thyroid Function Tests No results found for this basename: TSH,T4TOTAL,FREET3,T3FREE,THYROIDAB in the last 72 hours    Radiology/Studies  Dg Chest 2 View  02/20/2012  *RADIOLOGY REPORT*  Clinical  Data: CHF, shortness of breath  CHEST - 2 VIEW  Comparison: None.  Findings: Lungs are clear. No pleural effusion or pneumothorax.  The heart is top normal in size.  Degenerative changes of the visualized thoracolumbar spine.  IMPRESSION: No evidence of acute cardiopulmonary disease.   Original Report Authenticated By: Charline Bills, M.D.     ASSESSMENT AND PLAN   1.  Cardiomyopathy   Mixed 2.  Severe bifurcational LAD and cfx disease. 3.  Glucose intolerance  -  Improving.   4.  Tobacco use  -  On patch.   Plan  TEE today.  Need and risks discussed.     Signed, Shawnie Pons MD, [facility], FSCAI

## 2012-02-22 NOTE — CV Procedure (Signed)
    TRANSESOPHAGEAL ECHOCARDIOGRAM   NAME:  Dana [name]   MRN: [id number] DOB:  05-Jan-1946   ADMIT DATE: 02/17/2012  INDICATIONS: NSTEMI, MR/TR   PROCEDURE:   Informed consent was obtained prior to the procedure. The risks, benefits and alternatives for the procedure were discussed and the patient comprehended these risks.  Risks include, but are not limited to, cough, sore throat, vomiting, nausea, somnolence, esophageal and stomach trauma or perforation, bleeding, low blood pressure, aspiration, pneumonia, infection, trauma to the teeth and death.    After a procedural time-out, the patient was given 4 mg versed and 50 mcg fentanyl for moderate sedation.  The oropharynx was anesthetized 10 cc of topical 1% viscous lidocaine.  The transesophageal probe was inserted in the esophagus and stomach without difficulty and multiple views were obtained.    COMPLICATIONS:    There were no immediate complications.  FINDINGS:  LEFT VENTRICLE: EF = Moderately dilated (LVIDd = 6.3 cm) EF 15% with diffuse severe hypokinesis  RIGHT VENTRICLE: Normal size. Mild HK  LEFT ATRIUM: Dilated  LEFT ATRIAL APPENDAGE: No clot  RIGHT ATRIUM: Normal  AORTIC VALVE:  Trileaflet. Mildly thickened. No AI/AS  MITRAL VALVE:    Dilated annulus. Minimal restriction of posterior leaflet mild-moderate central MR  TRICUSPID VALVE: Normal. Trivial TR  PULMONIC VALVE: Not well seen.  INTERATRIAL SEPTUM: No PFO/ASD  PERICARDIUM: No effusion  DESCENDING AORTA: Moderate diffuse plaque.

## 2012-02-23 ENCOUNTER — Encounter (HOSPITAL_COMMUNITY)
Admission: EM | Disposition: A | Discharge status: Home / Self Care | Payer: Self-pay | Attending: Thoracic Surgery (Cardiothoracic Vascular Surgery)

## 2012-02-23 ENCOUNTER — Inpatient Hospital Stay (HOSPITAL_COMMUNITY): Payer: Medicare Other

## 2012-02-23 ENCOUNTER — Inpatient Hospital Stay (HOSPITAL_COMMUNITY): Payer: Medicare Other | Admitting: Certified Registered"

## 2012-02-23 ENCOUNTER — Encounter (HOSPITAL_COMMUNITY): Payer: Self-pay | Admitting: Certified Registered"

## 2012-02-23 DIAGNOSIS — I2510 Atherosclerotic heart disease of native coronary artery without angina pectoris: Secondary | ICD-10-CM

## 2012-02-23 DIAGNOSIS — I2589 Other forms of chronic ischemic heart disease: Secondary | ICD-10-CM

## 2012-02-23 HISTORY — PX: CORONARY ARTERY BYPASS GRAFT: SHX141

## 2012-02-23 LAB — POCT I-STAT 4, (NA,K, GLUC, HGB,HCT)
Glucose, Bld: 97 mg/dL (ref 70–99)
Glucose, Bld: 161 mg/dL — ABNORMAL HIGH (ref 70–99)
HCT: 33 % — ABNORMAL LOW (ref 36.0–46.0)
HCT: 20 % — ABNORMAL LOW (ref 36.0–46.0)
HCT: 24 % — ABNORMAL LOW (ref 36.0–46.0)
Hemoglobin: 6.8 g/dL — CL (ref 12.0–15.0)
Hemoglobin: 7.1 g/dL — ABNORMAL LOW (ref 12.0–15.0)
Potassium: 4.1 mEq/L (ref 3.5–5.1)
Sodium: 140 mEq/L (ref 135–145)
Sodium: 140 mEq/L (ref 135–145)
Sodium: 142 mEq/L (ref 135–145)

## 2012-02-23 LAB — POCT I-STAT 3, ART BLOOD GAS (G3+)
Acid-Base Excess: 1 mmol/L (ref 0.0–2.0)
Acid-Base Excess: 1 mmol/L (ref 0.0–2.0)
Acid-Base Excess: 2 mmol/L (ref 0.0–2.0)
Acid-base deficit: 1 mmol/L (ref 0.0–2.0)
Bicarbonate: 24.9 mEq/L — ABNORMAL HIGH (ref 20.0–24.0)
Bicarbonate: 26.8 mEq/L — ABNORMAL HIGH (ref 20.0–24.0)
Bicarbonate: 24.2 mEq/L — ABNORMAL HIGH (ref 20.0–24.0)
O2 Saturation: 100 %
O2 Saturation: 100 %
O2 Saturation: 99 %
Patient temperature: 34.7
Patient temperature: 37.4
TCO2: 26 mmol/L (ref 0–100)
pCO2 arterial: 36.5 mmHg (ref 35.0–45.0)
pH, Arterial: 7.419 (ref 7.350–7.450)
pH, Arterial: 7.362 (ref 7.350–7.450)
pH, Arterial: 7.399 (ref 7.350–7.450)
pO2, Arterial: 475 mmHg — ABNORMAL HIGH (ref 80.0–100.0)
pO2, Arterial: 160 mmHg — ABNORMAL HIGH (ref 80.0–100.0)

## 2012-02-23 LAB — CBC
HCT: 23.2 % — ABNORMAL LOW (ref 36.0–46.0)
HCT: 30.4 % — ABNORMAL LOW (ref 36.0–46.0)
MCHC: 31.3 g/dL (ref 30.0–36.0)
MCV: 82.3 fL (ref 78.0–100.0)
Platelets: 206 10*3/uL (ref 150–400)
RBC: 2.82 MIL/uL — ABNORMAL LOW (ref 3.87–5.11)
RDW: 16.9 % — ABNORMAL HIGH (ref 11.5–15.5)
WBC: 11 10*3/uL — ABNORMAL HIGH (ref 4.0–10.5)
WBC: 11.7 10*3/uL — ABNORMAL HIGH (ref 4.0–10.5)

## 2012-02-23 LAB — APTT
aPTT: 30 seconds (ref 24–37)
aPTT: 34 seconds (ref 24–37)

## 2012-02-23 LAB — BLOOD GAS, ARTERIAL
Drawn by: 36274
FIO2: 0.21 %
O2 Saturation: 99 %
Patient temperature: 98.6
pH, Arterial: 7.441 (ref 7.350–7.450)

## 2012-02-23 LAB — HEMOGLOBIN A1C: Mean Plasma Glucose: 108 mg/dL (ref <117)

## 2012-02-23 LAB — POCT I-STAT, CHEM 8
BUN: 17 mg/dL (ref 6–23)
Calcium, Ion: 1.14 mmol/L (ref 1.13–1.30)
Hemoglobin: 10.5 g/dL — ABNORMAL LOW (ref 12.0–15.0)
Sodium: 140 mEq/L (ref 135–145)
TCO2: 23 mmol/L (ref 0–100)

## 2012-02-23 LAB — MAGNESIUM: Magnesium: 3.4 mg/dL — ABNORMAL HIGH (ref 1.5–2.5)

## 2012-02-23 LAB — PROTIME-INR: INR: 1.66 — ABNORMAL HIGH (ref 0.00–1.49)

## 2012-02-23 LAB — CREATININE, SERUM
Creatinine, Ser: 0.8 mg/dL (ref 0.50–1.10)
GFR calc non Af Amer: 74 mL/min — ABNORMAL LOW (ref >90)

## 2012-02-23 LAB — GLUCOSE, CAPILLARY
Glucose-Capillary: 131 mg/dL — ABNORMAL HIGH (ref 70–99)
Glucose-Capillary: 131 mg/dL — ABNORMAL HIGH (ref 70–99)

## 2012-02-23 LAB — TSH: TSH: 6.872 u[IU]/mL — ABNORMAL HIGH (ref 0.350–4.500)

## 2012-02-23 SURGERY — CORONARY ARTERY BYPASS GRAFTING (CABG)
Anesthesia: General | Site: Chest | Wound class: Clean

## 2012-02-23 MED ORDER — ETOMIDATE 2 MG/ML IV SOLN
INTRAVENOUS | Status: DC | PRN
  Administered 2012-02-23: 10 mg via INTRAVENOUS

## 2012-02-23 MED ORDER — MORPHINE SULFATE 2 MG/ML IJ SOLN: 1.0000 mg | INTRAMUSCULAR | Status: AC | PRN

## 2012-02-23 MED ORDER — SODIUM CHLORIDE 0.45 % IV SOLN
INTRAVENOUS | Status: DC
  Administered 2012-02-23: 16:00:00 via INTRAVENOUS

## 2012-02-23 MED ORDER — PLASMA-LYTE 148 IV SOLN
INTRAVENOUS | Status: DC | PRN
  Administered 2012-02-23: 10:00:00

## 2012-02-23 MED ORDER — METOPROLOL TARTRATE 1 MG/ML IV SOLN: 2.5000 mg | INTRAVENOUS | Status: DC | PRN

## 2012-02-23 MED ORDER — METOPROLOL TARTRATE 25 MG/10 ML ORAL SUSPENSION
12.5000 mg | Freq: Two times a day (BID) | ORAL | Status: DC
  Filled 2012-02-23: qty 5

## 2012-02-23 MED ORDER — NITROGLYCERIN IN D5W 200-5 MCG/ML-% IV SOLN: 0.0000 ug/min | INTRAVENOUS | Status: DC

## 2012-02-23 MED ORDER — FENTANYL CITRATE 0.05 MG/ML IJ SOLN
INTRAMUSCULAR | Status: DC | PRN
  Administered 2012-02-23: 50 ug via INTRAVENOUS
  Administered 2012-02-23: 100 ug via INTRAVENOUS
  Administered 2012-02-23: 50 ug via INTRAVENOUS
  Administered 2012-02-23: 250 ug via INTRAVENOUS
  Administered 2012-02-23: 50 ug via INTRAVENOUS
  Administered 2012-02-23: 750 ug via INTRAVENOUS
  Administered 2012-02-23: 250 ug via INTRAVENOUS

## 2012-02-23 MED ORDER — ROCURONIUM BROMIDE 100 MG/10ML IV SOLN
INTRAVENOUS | Status: DC | PRN
  Administered 2012-02-23: 50 mg via INTRAVENOUS

## 2012-02-23 MED ORDER — VANCOMYCIN HCL IN DEXTROSE 1-5 GM/200ML-% IV SOLN
1000.0000 mg | Freq: Once | INTRAVENOUS | Status: AC
  Administered 2012-02-23: 1000 mg via INTRAVENOUS
  Filled 2012-02-23: qty 200

## 2012-02-23 MED ORDER — LACTATED RINGERS IV SOLN
INTRAVENOUS | Status: DC | PRN
  Administered 2012-02-23: 07:00:00 via INTRAVENOUS

## 2012-02-23 MED ORDER — MAGNESIUM SULFATE 40 MG/ML IJ SOLN
4.0000 g | Freq: Once | INTRAMUSCULAR | Status: AC
  Administered 2012-02-23: 4 g via INTRAVENOUS
  Filled 2012-02-23: qty 100

## 2012-02-23 MED ORDER — SODIUM CHLORIDE 0.9 % IV SOLN
INTRAVENOUS | Status: DC
  Administered 2012-02-23: 3.6 [IU]/h via INTRAVENOUS
  Administered 2012-02-23: 3.9 [IU]/h via INTRAVENOUS
  Administered 2012-02-24: 04:00:00 via INTRAVENOUS
  Administered 2012-02-24: 0.6 [IU]/h via INTRAVENOUS
  Administered 2012-02-24: 1.7 [IU]/h via INTRAVENOUS
  Filled 2012-02-23 (×2): qty 1

## 2012-02-23 MED ORDER — INSULIN REGULAR BOLUS VIA INFUSION
0.0000 [IU] | Freq: Three times a day (TID) | INTRAVENOUS | Status: DC
  Filled 2012-02-23: qty 10

## 2012-02-23 MED ORDER — BISACODYL 5 MG PO TBEC
10.0000 mg | DELAYED_RELEASE_TABLET | Freq: Every day | ORAL | Status: DC
  Administered 2012-02-24 – 2012-02-28 (×5): 10 mg via ORAL
  Filled 2012-02-23 (×4): qty 2

## 2012-02-23 MED ORDER — ARTIFICIAL TEARS OP OINT
TOPICAL_OINTMENT | OPHTHALMIC | Status: DC | PRN
  Administered 2012-02-23: 1 via OPHTHALMIC

## 2012-02-23 MED ORDER — CARVEDILOL 3.125 MG PO TABS
3.1250 mg | ORAL_TABLET | Freq: Two times a day (BID) | ORAL | Status: DC
  Filled 2012-02-23 (×4): qty 1

## 2012-02-23 MED ORDER — ACETAMINOPHEN 10 MG/ML IV SOLN
1000.0000 mg | Freq: Once | INTRAVENOUS | Status: AC
  Administered 2012-02-23: 1000 mg via INTRAVENOUS
  Filled 2012-02-23: qty 100

## 2012-02-23 MED ORDER — ALBUMIN HUMAN 5 % IV SOLN
INTRAVENOUS | Status: DC | PRN
  Administered 2012-02-23 (×2): via INTRAVENOUS

## 2012-02-23 MED ORDER — DOCUSATE SODIUM 100 MG PO CAPS
200.0000 mg | ORAL_CAPSULE | Freq: Every day | ORAL | Status: DC
  Administered 2012-02-24 – 2012-02-28 (×5): 200 mg via ORAL
  Filled 2012-02-23 (×6): qty 2

## 2012-02-23 MED ORDER — VECURONIUM BROMIDE 10 MG IV SOLR
INTRAVENOUS | Status: DC | PRN
  Administered 2012-02-23 (×4): 5 mg via INTRAVENOUS

## 2012-02-23 MED ORDER — CHLORHEXIDINE GLUCONATE CLOTH 2 % EX PADS: 6.0000 | MEDICATED_PAD | Freq: Every day | CUTANEOUS | Status: DC

## 2012-02-23 MED ORDER — MUPIROCIN 2 % EX OINT
1.0000 "application " | TOPICAL_OINTMENT | Freq: Two times a day (BID) | CUTANEOUS | Status: DC
  Filled 2012-02-23 (×2): qty 22

## 2012-02-23 MED ORDER — MILRINONE LOAD VIA INFUSION
INTRAVENOUS | Status: DC | PRN
  Administered 2012-02-23: 50 ug via INTRAVENOUS

## 2012-02-23 MED ORDER — PHENYLEPHRINE HCL 10 MG/ML IJ SOLN
0.0000 ug/min | INTRAVENOUS | Status: DC
  Filled 2012-02-23 (×3): qty 2

## 2012-02-23 MED ORDER — SODIUM CHLORIDE 0.9 % IJ SOLN: 3.0000 mL | INTRAMUSCULAR | Status: DC | PRN

## 2012-02-23 MED ORDER — ACETAMINOPHEN 160 MG/5ML PO SOLN
975.0000 mg | Freq: Four times a day (QID) | ORAL | Status: AC
  Filled 2012-02-23: qty 40.6

## 2012-02-23 MED ORDER — MILRINONE IN DEXTROSE 20 MG/100ML IV SOLN
0.2500 ug/kg/min | INTRAVENOUS | Status: DC
  Administered 2012-02-23 – 2012-02-25 (×2): 0.2 ug/kg/min via INTRAVENOUS
  Filled 2012-02-23: qty 100

## 2012-02-23 MED ORDER — SODIUM CHLORIDE 0.9 % IV SOLN: INTRAVENOUS | Status: DC

## 2012-02-23 MED ORDER — ONDANSETRON HCL 4 MG/2ML IJ SOLN
4.0000 mg | Freq: Four times a day (QID) | INTRAMUSCULAR | Status: DC | PRN
  Administered 2012-02-23 – 2012-02-25 (×3): 4 mg via INTRAVENOUS
  Filled 2012-02-23 (×3): qty 2

## 2012-02-23 MED ORDER — POTASSIUM CHLORIDE 10 MEQ/50ML IV SOLN
10.0000 meq | INTRAVENOUS | Status: AC
  Administered 2012-02-23 (×2): 10 meq via INTRAVENOUS

## 2012-02-23 MED ORDER — ALBUMIN HUMAN 5 % IV SOLN
250.0000 mL | INTRAVENOUS | Status: AC | PRN
  Administered 2012-02-23: 250 mL via INTRAVENOUS

## 2012-02-23 MED ORDER — HEMOSTATIC AGENTS (NO CHARGE) OPTIME
TOPICAL | Status: DC | PRN
  Administered 2012-02-23: 1 via TOPICAL

## 2012-02-23 MED ORDER — MORPHINE SULFATE 2 MG/ML IJ SOLN
2.0000 mg | INTRAMUSCULAR | Status: DC | PRN
  Administered 2012-02-23 – 2012-02-24 (×2): 2 mg via INTRAVENOUS
  Administered 2012-02-24 (×2): 4 mg via INTRAVENOUS
  Administered 2012-02-24 – 2012-02-25 (×2): 2 mg via INTRAVENOUS
  Filled 2012-02-23 (×2): qty 1
  Filled 2012-02-23: qty 2
  Filled 2012-02-23: qty 1
  Filled 2012-02-23: qty 2
  Filled 2012-02-23: qty 1

## 2012-02-23 MED ORDER — LACTATED RINGERS IV SOLN
INTRAVENOUS | Status: DC | PRN
  Administered 2012-02-23 (×2): via INTRAVENOUS

## 2012-02-23 MED ORDER — LACTATED RINGERS IV SOLN: 500.0000 mL | Freq: Once | INTRAVENOUS | Status: AC | PRN

## 2012-02-23 MED ORDER — HEPARIN SODIUM (PORCINE) 1000 UNIT/ML IJ SOLN
INTRAMUSCULAR | Status: DC | PRN
  Administered 2012-02-23: 2000 [IU] via INTRAVENOUS
  Administered 2012-02-23: 20000 [IU] via INTRAVENOUS

## 2012-02-23 MED ORDER — PANTOPRAZOLE SODIUM 40 MG PO TBEC
40.0000 mg | DELAYED_RELEASE_TABLET | Freq: Every day | ORAL | Status: DC
  Administered 2012-02-25 – 2012-02-29 (×5): 40 mg via ORAL
  Filled 2012-02-23 (×5): qty 1

## 2012-02-23 MED ORDER — SODIUM CHLORIDE 0.9 % IJ SOLN
3.0000 mL | Freq: Two times a day (BID) | INTRAMUSCULAR | Status: DC
  Administered 2012-02-24 – 2012-02-28 (×9): 3 mL via INTRAVENOUS

## 2012-02-23 MED ORDER — SODIUM CHLORIDE 0.9 % IV SOLN: 250.0000 mL | INTRAVENOUS | Status: DC

## 2012-02-23 MED ORDER — METOPROLOL TARTRATE 12.5 MG HALF TABLET
12.5000 mg | ORAL_TABLET | Freq: Two times a day (BID) | ORAL | Status: DC
  Filled 2012-02-23: qty 1

## 2012-02-23 MED ORDER — FAMOTIDINE IN NACL 20-0.9 MG/50ML-% IV SOLN
20.0000 mg | Freq: Two times a day (BID) | INTRAVENOUS | Status: AC
  Administered 2012-02-23: 20 mg via INTRAVENOUS

## 2012-02-23 MED ORDER — MIDAZOLAM HCL 2 MG/2ML IJ SOLN: 2.0000 mg | INTRAMUSCULAR | Status: DC | PRN

## 2012-02-23 MED ORDER — ASPIRIN EC 325 MG PO TBEC
325.0000 mg | DELAYED_RELEASE_TABLET | Freq: Every day | ORAL | Status: DC
  Administered 2012-02-24 – 2012-02-29 (×6): 325 mg via ORAL
  Filled 2012-02-23 (×6): qty 1

## 2012-02-23 MED ORDER — DEXMEDETOMIDINE HCL IN NACL 200 MCG/50ML IV SOLN
0.1000 ug/kg/h | INTRAVENOUS | Status: DC
  Filled 2012-02-23: qty 50

## 2012-02-23 MED ORDER — LACTATED RINGERS IV SOLN
INTRAVENOUS | Status: DC
  Administered 2012-02-25: 13:00:00 via INTRAVENOUS

## 2012-02-23 MED ORDER — DOPAMINE-DEXTROSE 3.2-5 MG/ML-% IV SOLN: 2.0000 ug/kg/min | INTRAVENOUS | Status: DC

## 2012-02-23 MED ORDER — PROTAMINE SULFATE 10 MG/ML IV SOLN
INTRAVENOUS | Status: DC | PRN
  Administered 2012-02-23: 200 mg via INTRAVENOUS
  Administered 2012-02-23: 20 mg via INTRAVENOUS

## 2012-02-23 MED ORDER — BISACODYL 10 MG RE SUPP: 10.0000 mg | Freq: Every day | RECTAL | Status: DC

## 2012-02-23 MED ORDER — CHLORHEXIDINE GLUCONATE 4 % EX LIQD
Freq: Once | CUTANEOUS | Status: AC
  Administered 2012-02-23: 01:00:00 via TOPICAL

## 2012-02-23 MED ORDER — OXYCODONE HCL 5 MG PO TABS
5.0000 mg | ORAL_TABLET | ORAL | Status: DC | PRN
  Administered 2012-02-23: 5 mg via ORAL
  Administered 2012-02-24 – 2012-02-27 (×7): 10 mg via ORAL
  Administered 2012-02-27 – 2012-02-29 (×3): 5 mg via ORAL
  Filled 2012-02-23: qty 2
  Filled 2012-02-23: qty 1
  Filled 2012-02-23 (×4): qty 2
  Filled 2012-02-23 (×2): qty 1
  Filled 2012-02-23: qty 2
  Filled 2012-02-23: qty 1
  Filled 2012-02-23: qty 2

## 2012-02-23 MED ORDER — MILRINONE IN DEXTROSE 20 MG/100ML IV SOLN
0.2500 ug/kg/min | INTRAVENOUS | Status: DC
  Administered 2012-02-23: .2 ug/kg/min via INTRAVENOUS
  Filled 2012-02-23: qty 100

## 2012-02-23 MED ORDER — ASPIRIN 81 MG PO CHEW: 324.0000 mg | CHEWABLE_TABLET | Freq: Every day | ORAL | Status: DC

## 2012-02-23 MED ORDER — DEXTROSE 5 % IV SOLN
1.5000 g | Freq: Two times a day (BID) | INTRAVENOUS | Status: AC
  Administered 2012-02-23 – 2012-02-25 (×4): 1.5 g via INTRAVENOUS
  Filled 2012-02-23 (×5): qty 1.5

## 2012-02-23 MED ORDER — GLYCOPYRROLATE 0.2 MG/ML IJ SOLN
INTRAMUSCULAR | Status: DC | PRN
  Administered 2012-02-23 (×2): 0.2 mg via INTRAVENOUS

## 2012-02-23 MED ORDER — SODIUM CHLORIDE 0.9 % IJ SOLN
OROMUCOSAL | Status: DC | PRN
  Administered 2012-02-23 (×4): via TOPICAL

## 2012-02-23 MED ORDER — ACETAMINOPHEN 500 MG PO TABS
1000.0000 mg | ORAL_TABLET | Freq: Four times a day (QID) | ORAL | Status: AC
  Administered 2012-02-24 – 2012-02-28 (×14): 1000 mg via ORAL
  Filled 2012-02-23 (×2): qty 2
  Filled 2012-02-23: qty 1
  Filled 2012-02-23 (×17): qty 2

## 2012-02-23 MED ORDER — MIDAZOLAM HCL 5 MG/5ML IJ SOLN
INTRAMUSCULAR | Status: DC | PRN
  Administered 2012-02-23 (×4): 2 mg via INTRAVENOUS
  Administered 2012-02-23: 3 mg via INTRAVENOUS
  Administered 2012-02-23 (×2): 2 mg via INTRAVENOUS

## 2012-02-23 SURGICAL SUPPLY — 127 items
ADAPTER CARDIO PERF ANTE/RETRO (ADAPTER) ×2 IMPLANT
ATTRACTOMAT 16X20 MAGNETIC DRP (DRAPES) ×2 IMPLANT
BAG DECANTER FOR FLEXI CONT (MISCELLANEOUS) ×2 IMPLANT
BANDAGE ELASTIC 4 VELCRO ST LF (GAUZE/BANDAGES/DRESSINGS) ×2 IMPLANT
BANDAGE ELASTIC 6 VELCRO ST LF (GAUZE/BANDAGES/DRESSINGS) ×2 IMPLANT
BANDAGE GAUZE ELAST BULKY 4 IN (GAUZE/BANDAGES/DRESSINGS) ×2 IMPLANT
BASKET HEART (ORDER IN 25'S) (MISCELLANEOUS) ×1
BASKET HEART (ORDER IN 25S) (MISCELLANEOUS) ×1 IMPLANT
BLADE STERNUM SYSTEM 6 (BLADE) ×2 IMPLANT
BLADE SURG 11 STRL SS (BLADE) ×2 IMPLANT
BLADE SURG 15 STRL LF DISP TIS (BLADE) ×1 IMPLANT
BLADE SURG 15 STRL SS (BLADE) ×1
CANISTER SUCTION 2500CC (MISCELLANEOUS) ×2 IMPLANT
CANNULA ARTERIAL NVNT 3/8 22FR (MISCELLANEOUS) IMPLANT
CANNULA GUNDRY RCSP 15FR (MISCELLANEOUS) IMPLANT
CANNULA VEN 1 STAGE STR 66236 (MISCELLANEOUS) IMPLANT
CATH CPB KIT HENDRICKSON (MISCELLANEOUS) ×2 IMPLANT
CATH ROBINSON RED A/P 18FR (CATHETERS) ×2 IMPLANT
CATH THORACIC 36FR (CATHETERS) ×2 IMPLANT
CATH THORACIC 36FR RT ANG (CATHETERS) ×2 IMPLANT
CLIP FOGARTY SPRING 6M (CLIP) IMPLANT
CLIP TI MEDIUM 24 (CLIP) IMPLANT
CLIP TI WIDE RED SMALL 24 (CLIP) ×4 IMPLANT
CLOTH BEACON ORANGE TIMEOUT ST (SAFETY) ×2 IMPLANT
CONN 1/2X1/2X1/2  BEN (MISCELLANEOUS) ×1
CONN 1/2X1/2X1/2 BEN (MISCELLANEOUS) ×1 IMPLANT
CONN 3/8X1/2 ST GISH (MISCELLANEOUS) ×4 IMPLANT
CONN Y 3/8X3/8X3/8  BEN (MISCELLANEOUS)
CONN Y 3/8X3/8X3/8 BEN (MISCELLANEOUS) IMPLANT
CONT SPEC 4OZ CLIKSEAL STRL BL (MISCELLANEOUS) ×2 IMPLANT
COVER SURGICAL LIGHT HANDLE (MISCELLANEOUS) ×4 IMPLANT
CRADLE DONUT ADULT HEAD (MISCELLANEOUS) ×2 IMPLANT
DERMABOND ADVANCED (GAUZE/BANDAGES/DRESSINGS) ×1
DERMABOND ADVANCED .7 DNX12 (GAUZE/BANDAGES/DRESSINGS) ×1 IMPLANT
DRAPE CARDIOVASCULAR INCISE (DRAPES) ×1
DRAPE SLUSH/WARMER DISC (DRAPES) ×2 IMPLANT
DRAPE SRG 135X102X78XABS (DRAPES) ×1 IMPLANT
DRSG COVADERM 4X14 (GAUZE/BANDAGES/DRESSINGS) ×2 IMPLANT
ELECT CAUTERY BLADE 6.4 (BLADE) IMPLANT
ELECT REM PT RETURN 9FT ADLT (ELECTROSURGICAL) ×4
ELECTRODE REM PT RTRN 9FT ADLT (ELECTROSURGICAL) ×2 IMPLANT
GLOVE BIO SURGEON STRL SZ 6 (GLOVE) ×6 IMPLANT
GLOVE BIO SURGEON STRL SZ 6.5 (GLOVE) ×4 IMPLANT
GLOVE BIO SURGEON STRL SZ7 (GLOVE) IMPLANT
GLOVE BIO SURGEON STRL SZ7.5 (GLOVE) IMPLANT
GLOVE BIOGEL PI IND STRL 6 (GLOVE) ×4 IMPLANT
GLOVE BIOGEL PI IND STRL 6.5 (GLOVE) ×1 IMPLANT
GLOVE BIOGEL PI IND STRL 7.0 (GLOVE) IMPLANT
GLOVE BIOGEL PI IND STRL 7.5 (GLOVE) IMPLANT
GLOVE BIOGEL PI INDICATOR 6 (GLOVE) ×4
GLOVE BIOGEL PI INDICATOR 6.5 (GLOVE) ×1
GLOVE BIOGEL PI INDICATOR 7.0 (GLOVE)
GLOVE BIOGEL PI INDICATOR 7.5 (GLOVE)
GLOVE EUDERMIC 7 POWDERFREE (GLOVE) ×12 IMPLANT
GOWN PREVENTION PLUS XLARGE (GOWN DISPOSABLE) ×4 IMPLANT
GOWN STRL NON-REIN LRG LVL3 (GOWN DISPOSABLE) ×8 IMPLANT
HEMOSTAT POWDER SURGIFOAM 1G (HEMOSTASIS) ×6 IMPLANT
HEMOSTAT SURGICEL 2X14 (HEMOSTASIS) ×2 IMPLANT
INSERT FOGARTY XLG (MISCELLANEOUS) IMPLANT
KIT BASIN OR (CUSTOM PROCEDURE TRAY) ×2 IMPLANT
KIT PAIN CUSTOM (MISCELLANEOUS) IMPLANT
KIT ROOM TURNOVER OR (KITS) ×2 IMPLANT
KIT SUCTION CATH 14FR (SUCTIONS) ×4 IMPLANT
KIT VASOVIEW W/TROCAR VH 2000 (KITS) ×2 IMPLANT
LEAD PACING EPI (Prosthesis & Implant Heart) ×2 IMPLANT
LINE VENT (MISCELLANEOUS) ×2 IMPLANT
MARKER GRAFT CORONARY BYPASS (MISCELLANEOUS) ×6 IMPLANT
NEEDLE BIOPSY 14X6 SOFT TISS (NEEDLE) ×2 IMPLANT
NS IRRIG 1000ML POUR BTL (IV SOLUTION) ×10 IMPLANT
PACK OPEN HEART (CUSTOM PROCEDURE TRAY) ×2 IMPLANT
PAD ARMBOARD 7.5X6 YLW CONV (MISCELLANEOUS) ×4 IMPLANT
PENCIL BUTTON HOLSTER BLD 10FT (ELECTRODE) ×2 IMPLANT
PUNCH AORTIC ROTATE 4.0MM (MISCELLANEOUS) IMPLANT
PUNCH AORTIC ROTATE 4.5MM 8IN (MISCELLANEOUS) IMPLANT
PUNCH AORTIC ROTATE 5MM 8IN (MISCELLANEOUS) IMPLANT
SPONGE GAUZE 4X4 12PLY (GAUZE/BANDAGES/DRESSINGS) ×4 IMPLANT
STOPCOCK 4 WAY LG BORE MALE ST (IV SETS) ×2 IMPLANT
SUCKER WEIGHTED FLEX (MISCELLANEOUS) ×2 IMPLANT
SUT BONE WAX W31G (SUTURE) ×2 IMPLANT
SUT ETHIBOND 2 0 SH (SUTURE) ×4 IMPLANT
SUT ETHIBOND 2 0 SH 36X2 (SUTURE) ×2 IMPLANT
SUT ETHIBOND 2 0 V4 (SUTURE) IMPLANT
SUT ETHIBOND 2 0V4 GREEN (SUTURE) IMPLANT
SUT MNCRL AB 4-0 PS2 18 (SUTURE) ×2 IMPLANT
SUT PROLENE 3 0 SH DA (SUTURE) ×2 IMPLANT
SUT PROLENE 3 0 SH1 36 (SUTURE) ×2 IMPLANT
SUT PROLENE 4 0 RB 1 (SUTURE) ×3
SUT PROLENE 4 0 SH DA (SUTURE) IMPLANT
SUT PROLENE 4-0 RB1 .5 CRCL 36 (SUTURE) ×3 IMPLANT
SUT PROLENE 5 0 C 1 36 (SUTURE) ×4 IMPLANT
SUT PROLENE 5 0 CC1 (SUTURE) ×2 IMPLANT
SUT PROLENE 6 0 C 1 30 (SUTURE) ×4 IMPLANT
SUT PROLENE 7 0 BV1 MDA (SUTURE) ×2 IMPLANT
SUT PROLENE 8 0 BV175 6 (SUTURE) IMPLANT
SUT SILK  1 MH (SUTURE) ×2
SUT SILK 1 MH (SUTURE) ×2 IMPLANT
SUT SILK 1 TIES 10X30 (SUTURE) ×2 IMPLANT
SUT SILK 2 0 (SUTURE) ×1
SUT SILK 2 0 SH CR/8 (SUTURE) ×4 IMPLANT
SUT SILK 2-0 18XBRD TIE 12 (SUTURE) ×1 IMPLANT
SUT SILK 3 0 SH CR/8 (SUTURE) ×2 IMPLANT
SUT SILK 4 0 (SUTURE) ×1
SUT SILK 4-0 18XBRD TIE 12 (SUTURE) ×1 IMPLANT
SUT STEEL 6MS V (SUTURE) IMPLANT
SUT STEEL STERNAL CCS#1 18IN (SUTURE) IMPLANT
SUT STEEL SZ 6 DBL 3X14 BALL (SUTURE) IMPLANT
SUT TEM PAC WIRE 2 0 SH (SUTURE) ×8 IMPLANT
SUT VIC AB 1 CTX 36 (SUTURE) ×2
SUT VIC AB 1 CTX36XBRD ANBCTR (SUTURE) ×2 IMPLANT
SUT VIC AB 2-0 CT1 27 (SUTURE)
SUT VIC AB 2-0 CT1 TAPERPNT 27 (SUTURE) IMPLANT
SUT VIC AB 2-0 CTX 27 (SUTURE) IMPLANT
SUT VIC AB 3-0 SH 27 (SUTURE) ×2
SUT VIC AB 3-0 SH 27X BRD (SUTURE) ×2 IMPLANT
SUT VIC AB 3-0 X1 27 (SUTURE) IMPLANT
SUT VICRYL 4-0 PS2 18IN ABS (SUTURE) IMPLANT
SUTURE E-PAK OPEN HEART (SUTURE) ×2 IMPLANT
SYSTEM SAHARA CHEST DRAIN ATS (WOUND CARE) ×2 IMPLANT
TOWEL OR 17X24 6PK STRL BLUE (TOWEL DISPOSABLE) ×4 IMPLANT
TOWEL OR 17X26 10 PK STRL BLUE (TOWEL DISPOSABLE) ×4 IMPLANT
TRAY CATH LUMEN 1 20CM STRL (SET/KITS/TRAYS/PACK) ×4 IMPLANT
TRAY FOLEY IC TEMP SENS 14FR (CATHETERS) ×2 IMPLANT
TUBE FEEDING 8FR 16IN STR KANG (MISCELLANEOUS) ×2 IMPLANT
TUBING ART PRESS 48 MALE/FEM (TUBING) ×4 IMPLANT
TUBING INSUFFLATION 10FT LAP (TUBING) ×2 IMPLANT
UNDERPAD 30X30 INCONTINENT (UNDERPADS AND DIAPERS) ×2 IMPLANT
WATER STERILE IRR 1000ML POUR (IV SOLUTION) ×4 IMPLANT

## 2012-02-23 NOTE — Anesthesia Preprocedure Evaluation (Signed)
Anesthesia Evaluation  Patient identified by MRN, date of birth, ID band Patient awake    Reviewed: Allergy & Precautions, H&P , NPO status , Patient's Chart, lab work & pertinent test results, reviewed documented beta blocker date and time   History of Anesthesia Complications (+) PROLONGED EMERGENCE  Airway Mallampati: II TM Distance: >3 FB Neck ROM: full    Dental   Pulmonary neg pulmonary ROS,  breath sounds clear to auscultation        Cardiovascular + CAD and +CHF + dysrhythmias + Valvular Problems/Murmurs Rhythm:regular     Neuro/Psych negative neurological ROS  negative psych ROS   GI/Hepatic negative GI ROS, Neg liver ROS,   Endo/Other  negative endocrine ROS  Renal/GU negative Renal ROS  negative genitourinary   Musculoskeletal   Abdominal   Peds  Hematology negative hematology ROS (+)   Anesthesia Other Findings See surgeon's H&P   Reproductive/Obstetrics negative OB ROS                           Anesthesia Physical Anesthesia Plan  ASA: IV  Anesthesia Plan: General   Post-op Pain Management:    Induction: Intravenous  Airway Management Planned: Oral ETT  Additional Equipment: Arterial line, CVP, PA Cath, TEE and Ultrasound Guidance Line Placement  Intra-op Plan:   Post-operative Plan: Extubation in OR  Informed Consent: I have reviewed the patients History and Physical, chart, labs and discussed the procedure including the risks, benefits and alternatives for the proposed anesthesia with the patient or authorized representative who has indicated his/her understanding and acceptance.   Dental Advisory Given  Plan Discussed with: CRNA and Surgeon  Anesthesia Plan Comments:         Anesthesia Quick Evaluation

## 2012-02-23 NOTE — Progress Notes (Signed)
Utilization Review Completed.  Dana [name], [place] T  02/23/2012

## 2012-02-23 NOTE — Preoperative (Signed)
Beta Blockers   Reason not to administer Beta Blockers:Not Applicable 

## 2012-02-23 NOTE — Brief Op Note (Addendum)
02/17/2012 - 02/23/2012  11:05 AM  PATIENT:  Dana [name]  66 y.o. female  PRE-OPERATIVE DIAGNOSIS:  1.CAD 2.Moderate mitral regurgitation 3. Trivial tricuspid regurgitation  POST-OPERATIVE DIAGNOSIS:  1.CAD 2.Moderate mitral regurgitation 3. Trivial tricuspid regurgitation  PROCEDURE:  Median sternotomy for CORONARY ARTERY BYPASS GRAFTING (CABG) x  2 (LIMA to LAD, SVG to Diagonal) with EVH from partial right thigh Myocardial biopsy Placement of lateral epicardial pacemeker lead Placement of left femoral A line.  SURGEON:  Surgeon(s) and Role:    * Loreli Slot, MD - Primary  PHYSICIAN ASSISTANT: Doree Fudge PA-C   ANESTHESIA:   general  DRAINS:  Chest Tube(s) in the Mediastinal and Pleural Spaces    SPECIMEN:  Source of Specimen:  Biopsy of the myocardium  DISPOSITION OF SPECIMEN:  PATHOLOGY  COUNTS:  YES  PLAN OF CARE: Admit to inpatient   PATIENT DISPOSITION:  ICU - intubated and hemodynamically stable.   Delay start of Pharmacological VTE agent (>24hrs) due to surgical blood loss or risk of bleeding: yes  PRE Op WEIGHT: 65 kg  XC= 39 min CPB= 71 min off DDD 90, milrinone at 0.25 and dopamine at 3  Good targets, SVG good, LIMA fair TEE EF ~ 15%, 2+ MR, did not worsen with provocative maneuvers.  POST TEE- improved LV wall motion(on inotropes) 1-2+ MR

## 2012-02-23 NOTE — H&P (Signed)
Reason for Consult:2 vessel CAD  Referring Physician: Dr. Lurena Joiner Dana [name] is an 66 y.o. female.  HPI: 66 yo with no prior cardiac history who presented with a 2 week history of SOB and leg swelling. She has a history of tobacco use and a family history of heart disease but no other known CRF. She was in her usual state of health until 2 weeks ago. She 1st noted sneezing and sinus congestion as well as congestion in her chest. She thought this was seasonal allergies, which she has every Spring and Fall. However, she began having more of a sensation of congestion in her chest and shortness of breath with exertion. Shortly thereafter she developed orthopnea and swelling in her legs L > R. She then went to urgent care and was admitted to [facility] with CHF. She ruled out for acute MI. An echo showed severe LV dysfunction with an EF of 20% and mild to moderate MR and TR. Cardiac cath showed 2 vessel CAD. She is now transferred to [facility] for further management.  Her symptoms have improved since admission.  Past Medical History   Diagnosis  Date   .  Psoriasis     History reviewed. No pertinent past surgical history.  Family History   Problem  Relation  Age of Onset   .  Heart disease  Father     Social History: reports that she has been smoking. She does not have any smokeless tobacco history on file. She reports that she does not drink alcohol or use illicit drugs.  Allergies: No Known Allergies  Medications:  Scheduled:  .  aspirin EC  81 mg  Oral  Daily   .  carvedilol  3.125 mg  Oral  BID WC   .  ferrous sulfate  325 mg  Oral  TID WC   .  furosemide  40 mg  Oral  Daily   .  influenza inactive virus vaccine  0.5 mL  Intramuscular  Tomorrow-1000   .  lisinopril  10 mg  Oral  Daily   .  pneumococcal 23 valent vaccine  0.5 mL  Intramuscular  Tomorrow-1000   .  potassium chloride SA      .  potassium chloride  20 mEq  Oral  Daily   .  potassium chloride  40 mEq  Oral  Once   .  sodium  chloride  3 mL  Intravenous  Q12H    Results for orders placed during the hospital encounter of 02/17/12 (from the past 48 hour(s))   URINALYSIS, MICROSCOPIC ONLY Status: Abnormal    Collection Time    02/17/12 5:10 PM   Component  Value  Range  Comment    Color, Urine  YELLOW  YELLOW     APPearance  CLEAR  CLEAR     Specific Gravity, Urine  1.015  1.005 - 1.030     pH  6.5  5.0 - 8.0     Glucose, UA  NEGATIVE  NEGATIVE mg/dL     Hgb urine dipstick  TRACE (*)  NEGATIVE     Bilirubin Urine  NEGATIVE  NEGATIVE     Ketones, ur  NEGATIVE  NEGATIVE mg/dL     Protein, ur  NEGATIVE  NEGATIVE mg/dL     Urobilinogen, UA  1.0  0.0 - 1.0 mg/dL     Nitrite  NEGATIVE  NEGATIVE     Leukocytes, UA  NEGATIVE  NEGATIVE     WBC, UA  0-2  <3 WBC/hpf     RBC / HPF  0-2  <3 RBC/hpf     Squamous Epithelial / LPF  RARE  RARE    MRSA PCR SCREENING Status: Normal    Collection Time    02/17/12 7:20 PM   Component  Value  Range  Comment    MRSA by PCR  NEGATIVE  NEGATIVE    MAGNESIUM Status: Normal    Collection Time    02/17/12 7:22 PM   Component  Value  Range  Comment    Magnesium  1.8  1.5 - 2.5 mg/dL    PRO B NATRIURETIC PEPTIDE Status: Abnormal    Collection Time    02/17/12 7:22 PM   Component  Value  Range  Comment    Pro B Natriuretic peptide (BNP)  17774.0 (*)  0 - 125 pg/mL    CBC Status: Abnormal    Collection Time    02/17/12 7:22 PM   Component  Value  Range  Comment    WBC  10.1  4.0 - 10.5 K/uL     RBC  4.38  3.87 - 5.11 MIL/uL     Hemoglobin  10.8 (*)  12.0 - 15.0 g/dL     HCT  16.1 (*)  09.6 - 46.0 %     MCV  80.4  78.0 - 100.0 fL     MCH  24.7 (*)  26.0 - 34.0 pg     MCHC  30.7  30.0 - 36.0 g/dL     RDW  04.5 (*)  40.9 - 15.5 %     Platelets  334  150 - 400 K/uL    VITAMIN B12 Status: Normal    Collection Time    02/17/12 7:22 PM   Component  Value  Range  Comment    Vitamin B-12  576  211 - 911 pg/mL    FOLATE Status: Normal    Collection Time    02/17/12 7:22 PM     Component  Value  Range  Comment    Folate  >20.0     IRON AND TIBC Status: Abnormal    Collection Time    02/17/12 7:22 PM   Component  Value  Range  Comment    Iron  28 (*)  42 - 135 ug/dL     TIBC  811  914 - 782 ug/dL     Saturation Ratios  6 (*)  20 - 55 %     UIBC  431 (*)  125 - 400 ug/dL    FERRITIN Status: Normal    Collection Time    02/17/12 7:22 PM   Component  Value  Range  Comment    Ferritin  16  10 - 291 ng/mL    RETICULOCYTES Status: Normal    Collection Time    02/17/12 7:22 PM   Component  Value  Range  Comment    Retic Ct Pct  2.2  0.4 - 3.1 %     RBC.  4.38  3.87 - 5.11 MIL/uL     Retic Count, Manual  96.4  19.0 - 186.0 K/uL    BASIC METABOLIC PANEL Status: Abnormal    Collection Time    02/17/12 7:22 PM   Component  Value  Range  Comment    Sodium  141  135 - 145 mEq/L     Potassium  4.0  3.5 - 5.1 mEq/L     Chloride  101  96 -  112 mEq/L     CO2  29  19 - 32 mEq/L     Glucose, Bld  91  70 - 99 mg/dL     BUN  18  6 - 23 mg/dL     Creatinine, Ser  4.54  0.50 - 1.10 mg/dL     Calcium  9.3  8.4 - 10.5 mg/dL     GFR calc non Af Amer  63 (*)  >90 mL/min     GFR calc Af Amer  72 (*)  >90 mL/min    BASIC METABOLIC PANEL Status: Abnormal    Collection Time    02/18/12 5:15 AM   Component  Value  Range  Comment    Sodium  141  135 - 145 mEq/L     Potassium  3.1 (*)  3.5 - 5.1 mEq/L  DELTA CHECK NOTED    Chloride  102  96 - 112 mEq/L     CO2  31  19 - 32 mEq/L     Glucose, Bld  92  70 - 99 mg/dL     BUN  18  6 - 23 mg/dL     Creatinine, Ser  0.98  0.50 - 1.10 mg/dL     Calcium  8.6  8.4 - 10.5 mg/dL     GFR calc non Af Amer  66 (*)  >90 mL/min     GFR calc Af Amer  76 (*)  >90 mL/min    PROTIME-INR Status: Normal    Collection Time    02/18/12 5:15 AM   Component  Value  Range  Comment    Prothrombin Time  14.2  11.6 - 15.2 seconds     INR  1.11  0.00 - 1.49    CBC Status: Abnormal    Collection Time    02/18/12 5:15 AM   Component  Value  Range   Comment    WBC  7.0  4.0 - 10.5 K/uL     RBC  4.00  3.87 - 5.11 MIL/uL     Hemoglobin  9.8 (*)  12.0 - 15.0 g/dL     HCT  11.9 (*)  14.7 - 46.0 %     MCV  81.0  78.0 - 100.0 fL     MCH  24.5 (*)  26.0 - 34.0 pg     MCHC  30.2  30.0 - 36.0 g/dL     RDW  82.9 (*)  56.2 - 15.5 %     Platelets  306  150 - 400 K/uL    HEPATIC FUNCTION PANEL Status: Abnormal    Collection Time    02/18/12 5:15 AM   Component  Value  Range  Comment    Total Protein  5.7 (*)  6.0 - 8.3 g/dL     Albumin  2.9 (*)  3.5 - 5.2 g/dL     AST  20  0 - 37 U/L     ALT  25  0 - 35 U/L     Alkaline Phosphatase  73  39 - 117 U/L     Total Bilirubin  0.7  0.3 - 1.2 mg/dL     Bilirubin, Direct  0.2  0.0 - 0.3 mg/dL     Indirect Bilirubin  0.5  0.3 - 0.9 mg/dL     No results found.  Review of Systems  Constitutional: Positive for malaise/fatigue. Negative for fever.  HENT:  Sneezing, sinus congestion  Eyes: Negative.  Respiratory: Positive for  cough, sputum production and shortness of breath.  Cardiovascular: Positive for orthopnea and leg swelling (left > right). Negative for chest pain, palpitations and claudication.  Gastrointestinal: Negative.  Genitourinary: Negative.  Skin: Positive for rash (psoriasis).  Neurological: Negative.  Endo/Heme/Allergies: Negative.  All other systems reviewed and are negative.   Blood pressure 111/74, pulse 74, temperature 97.4 F (36.3 [name]), temperature source Oral, resp. rate 20, height 5\' 1"  (1.549 m), weight 146 lb 13.2 oz (66.6 kg), SpO2 95.00%.  Physical Exam  Vitals reviewed.  Constitutional: She is oriented to person, place, and time. She appears well-developed and well-nourished. No distress.  HENT:  Head: Normocephalic and atraumatic.  Eyes: EOM are normal. Pupils are equal, round, and reactive to light.  Neck: Normal range of motion. Neck supple. JVD present. No thyromegaly present.  No bruits  Cardiovascular: Normal rate, regular rhythm and intact distal pulses.  Exam reveals gallop.  Murmur (2/6 systolic) heard.  Respiratory: No respiratory distress. She has no wheezes. She has rales.  GI: Soft. There is no tenderness.  Musculoskeletal: She exhibits no edema.  Lymphadenopathy:  She has no cervical adenopathy.  Neurological: She is alert and oriented to person, place, and time.  No motor deficits  Skin: Skin is warm and dry. Rash noted.  Psoriasis left leg  Psychiatric: She has a normal mood and affect.   Assessment/Plan:  66 yo woman presents with decompensated CHF. She has severe 2 vessel CAD involving the LAD and circumflex. She also has a dilated cardiomyopathy with an EF of 20% with mild to moderate MR and TR. There may be an ischemic component to her cardiomyopathy but she is globally hypokinetic including areas where there are no flow limiting stenoses.  Her LAD lesion involves the takeoff of a large diagonal branch(about equal in size to the LAD). These vessels are both good targets for grafting. Her circumflex disease is a little trickier with the lesion involving the LCx high in the AV groove compromising the most distal OM which is very small at a site where it might be graftable. IMO this vessel would be better approached with PTCA and stenting. This raises the possibility of a staged hybrid procedure with CABG of LAD and diagonal followed in the early postop period by PTCA of the circumflex- will discuss this possibility with Dr. Riley Kill.  Another consideration for surgery are the MR and TR, those valves would have to evaluated and may require rings at time of surgery. Finally, another issue raised by Dr. Johney Frame is the LBBB and the possibility of placing LV leads at the time of surgery, which I think is a good idea.  She does need continued medical optimization prior to surgical intervention  Dana [name]  02/18/2012, 6:19 PM   Improved with medical therapy TEE showed mild- moderate MR, no significant TR, EF 15%, global  hypokinesis

## 2012-02-23 NOTE — Interval H&P Note (Signed)
History and Physical Interval Note:  02/23/2012 7:42 AM  Dana [name]  has presented today for surgery, with the diagnosis of CAD; mitral and tricuspid regurgitation  The various methods of treatment have been discussed with the patient and family. After consideration of risks, benefits and other options for treatment, the patient has consented to  Procedure(s) (LRB) with comments: CORONARY ARTERY BYPASS GRAFTING (CABG) (N/A) MITRAL VALVE REPAIR (MVR) (N/A) TRICUSPID VALVE REPAIR (N/A) as a surgical intervention .  The patient's history has been reviewed, patient examined, no change in status, stable for surgery.  I have reviewed the patient's chart and labs.  Questions were answered to the patient's satisfaction.     [name]

## 2012-02-23 NOTE — Anesthesia Procedure Notes (Addendum)
Procedure Name: Intubation Date/Time: 02/23/2012 8:03 AM Performed by: Gayla Medicus Pre-anesthesia Checklist: Patient identified, Timeout performed, Emergency Drugs available, Suction available and Patient being monitored Patient Re-evaluated:Patient Re-evaluated prior to inductionOxygen Delivery Method: Circle system utilized Preoxygenation: Pre-oxygenation with 100% oxygen Intubation Type: IV induction Ventilation: Mask ventilation without difficulty Laryngoscope Size: Mac and 3 Grade View: Grade I Tube type: Oral Tube size: 8.0 mm Number of attempts: 1 Airway Equipment and Method: Stylet Placement Confirmation: ETT inserted through vocal cords under direct vision,  positive ETCO2 and breath sounds checked- equal and bilateral Secured at: 21 cm Tube secured with: Tape Dental Injury: Teeth and Oropharynx as per pre-operative assessment  Comments: Pt with extremely poor dentition.  Lower front teeth connected with brownish film.  Entire row noted to be loose prior to induction.  No change in dentition after DL.

## 2012-02-23 NOTE — Transfer of Care (Signed)
Immediate Anesthesia Transfer of Care Note  Patient: Dana [name]  Procedure(s) Performed: Procedure(s) (LRB) with comments: CORONARY ARTERY BYPASS GRAFTING (CABG) (N/A) - coronary artery bypass graft on pump times two utlizing left internal mammary artery and right greater saphenous vein harvested endoscopically, transesophageal echocardiogram   Patient Location: PACU  Anesthesia Type: General  Level of Consciousness: sedated  Airway & Oxygen Therapy: Patient remains intubated per anesthesia plan  Post-op Assessment: Report given to PACU RN and Post -op Vital signs reviewed and stable  Post vital signs: Reviewed and stable  Complications: No apparent anesthesia complications

## 2012-02-23 NOTE — Progress Notes (Signed)
CRITICAL VALUE ALERT  Critical value received:  hgb 6.9  Date of notification:  02/23/12  Time of notification:  1325  Critical value read back:yes  Nurse who received alert:  Eda Paschal  MD notified (1st page):  Dr. Dorris Fetch   Time of first page:  1325  MD notified (2nd page):  Time of second page:  Responding MD:  Dr. Dorris Fetch  Time MD responded:  1325

## 2012-02-23 NOTE — Progress Notes (Signed)
Already extubated Neuro intact  BP 124/56  Pulse 93  Temp 99.1 F (37.3 C) (Core (Comment))  Resp 28  Ht 5\' 1"  (1.549 m)  Wt 142 lb 8 oz (64.638 kg)  BMI 26.93 kg/m2  SpO2 99% CI 1.96   Intake/Output Summary (Last 24 hours) at 02/23/12 1731 Last data filed at 02/23/12 1630  Gross per 24 hour  Intake 4603.23 ml  Output   3935 ml  Net 668.23 ml   Initial Hgb 6.9- transfused 1 unit  Doing well early postop Continue dopamine and milrinone

## 2012-02-23 NOTE — Anesthesia Postprocedure Evaluation (Signed)
  Anesthesia Post-op Note  Patient: Dana [name]  Procedure(s) Performed: Procedure(s) (LRB) with comments: CORONARY ARTERY BYPASS GRAFTING (CABG) (N/A) - coronary artery bypass graft on pump times two utlizing left internal mammary artery and right greater saphenous vein harvested endoscopically, transesophageal echocardiogram   Patient Location: PACU and SICU  Anesthesia Type: General  Level of Consciousness: sedated and Patient remains intubated per anesthesia plan  Airway and Oxygen Therapy: Patient remains intubated per anesthesia plan and Patient placed on Ventilator (see vital sign flow sheet for setting)  Post-op Pain: none  Post-op Assessment: Post-op Vital signs reviewed, Patient's Cardiovascular Status Stable, Respiratory Function Stable, Patent Airway, No signs of Nausea or vomiting and Pain level controlled  Post-op Vital Signs: Reviewed and stable  Complications: No apparent anesthesia complications

## 2012-02-23 NOTE — Procedures (Signed)
Extubation Procedure Note  Patient Details:   Name: Dana [name] DOB: 1945-08-30 MRN: [id number]   Pt extubated to 4L [place] per protocol, HR 93, RR 28, SPO2 99%. NIF & VC WNL, pt able to vocalize, no stridor noted, RT to monitor.                     Evaluation  O2 sats: stable throughout Complications: No apparent complications Patient did tolerate procedure well. Bilateral Breath Sounds: Expiratory wheezes;Diminished   Yes  Harley Hallmark 02/23/2012, 5:30 PM

## 2012-02-24 ENCOUNTER — Inpatient Hospital Stay (HOSPITAL_COMMUNITY): Payer: Medicare Other

## 2012-02-24 LAB — CBC
MCH: 26.2 pg (ref 26.0–34.0)
MCHC: 31.2 g/dL (ref 30.0–36.0)
MCV: 83.9 fL (ref 78.0–100.0)
Platelets: 196 10*3/uL (ref 150–400)
Platelets: 185 10*3/uL (ref 150–400)
RBC: 3.17 MIL/uL — ABNORMAL LOW (ref 3.87–5.11)
RBC: 3.04 MIL/uL — ABNORMAL LOW (ref 3.87–5.11)
RDW: 17 % — ABNORMAL HIGH (ref 11.5–15.5)
RDW: 17.7 % — ABNORMAL HIGH (ref 11.5–15.5)
WBC: 14.5 10*3/uL — ABNORMAL HIGH (ref 4.0–10.5)

## 2012-02-24 LAB — GLUCOSE, CAPILLARY
Glucose-Capillary: 91 mg/dL (ref 70–99)
Glucose-Capillary: 128 mg/dL — ABNORMAL HIGH (ref 70–99)
Glucose-Capillary: 121 mg/dL — ABNORMAL HIGH (ref 70–99)
Glucose-Capillary: 111 mg/dL — ABNORMAL HIGH (ref 70–99)

## 2012-02-24 LAB — BASIC METABOLIC PANEL
CO2: 24 mEq/L (ref 19–32)
Calcium: 8 mg/dL — ABNORMAL LOW (ref 8.4–10.5)
Creatinine, Ser: 0.75 mg/dL (ref 0.50–1.10)
GFR calc Af Amer: >90 mL/min (ref >90)
GFR calc non Af Amer: 85 mL/min — ABNORMAL LOW (ref >90)
Sodium: 136 mEq/L (ref 135–145)

## 2012-02-24 LAB — CREATININE, SERUM
GFR calc Af Amer: 52 mL/min — ABNORMAL LOW (ref >90)
GFR calc non Af Amer: 45 mL/min — ABNORMAL LOW (ref >90)

## 2012-02-24 LAB — POCT I-STAT, CHEM 8
Calcium, Ion: 1.55 mmol/L — ABNORMAL HIGH (ref 1.13–1.30)
Chloride: 103 mEq/L (ref 96–112)
HCT: 26 % — ABNORMAL LOW (ref 36.0–46.0)
TCO2: 21 mmol/L (ref 0–100)

## 2012-02-24 LAB — MAGNESIUM
Magnesium: 2.6 mg/dL — ABNORMAL HIGH (ref 1.5–2.5)
Magnesium: 2.4 mg/dL (ref 1.5–2.5)

## 2012-02-24 MED ORDER — ENOXAPARIN SODIUM 40 MG/0.4ML ~~LOC~~ SOLN
40.0000 mg | SUBCUTANEOUS | Status: DC
  Administered 2012-02-24 – 2012-02-28 (×5): 40 mg via SUBCUTANEOUS
  Filled 2012-02-24 (×6): qty 0.4

## 2012-02-24 MED ORDER — POTASSIUM CHLORIDE 10 MEQ/50ML IV SOLN
10.0000 meq | INTRAVENOUS | Status: AC
  Administered 2012-02-24 (×4): 10 meq via INTRAVENOUS

## 2012-02-24 MED ORDER — CALCIUM CHLORIDE 10 % IV SOLN
1.0000 g | Freq: Once | INTRAVENOUS | Status: AC
  Administered 2012-02-24: 1 g via INTRAVENOUS
  Filled 2012-02-24: qty 10

## 2012-02-24 MED ORDER — INSULIN ASPART 100 UNIT/ML ~~LOC~~ SOLN
0.0000 [IU] | SUBCUTANEOUS | Status: AC
  Administered 2012-02-24: 2 [IU] via SUBCUTANEOUS

## 2012-02-24 MED ORDER — INSULIN ASPART 100 UNIT/ML ~~LOC~~ SOLN
0.0000 [IU] | SUBCUTANEOUS | Status: DC
  Administered 2012-02-24: 2 [IU] via SUBCUTANEOUS

## 2012-02-24 MED ORDER — INSULIN ASPART 100 UNIT/ML ~~LOC~~ SOLN
0.0000 [IU] | SUBCUTANEOUS | Status: DC
  Administered 2012-02-25: 2 [IU] via SUBCUTANEOUS

## 2012-02-24 MED ORDER — DEXTROSE 50 % IV SOLN
INTRAVENOUS | Status: AC
  Administered 2012-02-24: 18 mL
  Filled 2012-02-24: qty 50

## 2012-02-24 MED ORDER — ALBUMIN HUMAN 5 % IV SOLN
12.5000 g | Freq: Once | INTRAVENOUS | Status: AC
  Administered 2012-02-24: 12.5 g via INTRAVENOUS

## 2012-02-24 MED ORDER — FUROSEMIDE 10 MG/ML IJ SOLN
40.0000 mg | Freq: Two times a day (BID) | INTRAMUSCULAR | Status: AC
  Administered 2012-02-24 (×2): 40 mg via INTRAVENOUS

## 2012-02-24 NOTE — Progress Notes (Signed)
Patient ID: Dana [name], female   DOB: 1945/09/30, 66 y.o.   MRN: [id number]   SICU Evening Rounds:  Dopamine weaned off today but SBP dropped into 80's so put back on dop at . Remains on Milrinone at 0.2.  UO 35/hr  Creat increased to 1.4 this afternoon.  BMET    Component Value Date/Time   NA 135 02/24/2012 1628   K 5.2* 02/24/2012 1628   CL 103 02/24/2012 1628   CO2 24 02/24/2012 0410   GLUCOSE 124* 02/24/2012 1628   BUN 17 02/24/2012 1628   CREATININE 1.40* 02/24/2012 1628   CALCIUM 8.0* 02/24/2012 0410   GFRNONAA 85* 02/24/2012 0410   GFRAA >90 02/24/2012 0410    CBC    Component Value Date/Time   WBC 14.5* 02/24/2012 1635   RBC 3.04* 02/24/2012 1635   HGB 8.3* 02/24/2012 1635   HCT 26.2* 02/24/2012 1635   PLT 185 02/24/2012 1635   MCV 86.2 02/24/2012 1635   MCH 27.3 02/24/2012 1635   MCHC 31.7 02/24/2012 1635   RDW 17.7* 02/24/2012 1635    A/P: Low EF preop. Continue dop and milrinone for now.

## 2012-02-24 NOTE — Progress Notes (Signed)
1 Day Post-Op Procedure(s) (LRB): CORONARY ARTERY BYPASS GRAFTING (CABG) (N/A) Subjective: C/o incisional pain and being thirsty  Objective: Vital signs in last 24 hours: Temp:  [94.5 F (34.7 C)-99.5 F (37.5 C)] 99.5 F (37.5 C) (10/25 0800) Pulse Rate:  [82-102] 102  (10/25 0800) Cardiac Rhythm:  [-] Normal sinus rhythm (10/25 0400) Resp:  [12-30] 23  (10/25 0800) BP: (84-124)/(46-91) 109/60 mmHg (10/25 0700) SpO2:  [98 %-100 %] 99 % (10/25 0800) Arterial Line BP: (81-135)/(45-68) 135/57 mmHg (10/25 0800) FiO2 (%):  [40 %-50 %] 40 % (10/24 1700) Weight:  [160 lb 4.8 oz (72.712 kg)] 160 lb 4.8 oz (72.712 kg) (10/25 0700)  Hemodynamic parameters for last 24 hours: PAP: (31-51)/(15-27) 46/24 mmHg CO:  [3 L/min-4.5 L/min] 4.4 L/min CI:  [1.9 L/min/m2-2.7 L/min/m2] 2.7 L/min/m2  Intake/Output from previous day: 10/24 0701 - 10/25 0700 In: 5979.3 [P.O.:100; I.V.:4197.3; Blood:500; NG/GT:30; IV Piggyback:1152] Out: 4675 [Urine:3955; Emesis/NG output:40; Blood:300; Chest Tube:380] Intake/Output this shift: Total I/O In: -  Out: 105 [Urine:25; Chest Tube:80]  General appearance: alert and no distress Neurologic: intact Heart: regular rate and rhythm Lungs: diminished breath sounds bibasilar Abdomen: normal findings: soft, non-tender  Lab Results:  Basename 02/24/12 0410 02/23/12 1840 02/23/12 1831  WBC 10.6* -- 11.7*  HGB 8.3* 10.5* --  HCT 26.6* 31.0* --  PLT 196 -- 235   BMET:  Basename 02/24/12 0410 02/23/12 1840  NA 136 140  K 3.8 4.5  CL 104 107  CO2 24 --  GLUCOSE 61* 150*  BUN 14 17  CREATININE 0.75 0.90  CALCIUM 8.0* --    PT/INR:  Basename 02/23/12 1300  LABPROT 19.1*  INR 1.66*   ABG    Component Value Date/Time   PHART 7.399 02/23/2012 1845   HCO3 25.2* 02/23/2012 1845   TCO2 26 02/23/2012 1845   ACIDBASEDEF 1.0 02/23/2012 1724   O2SAT 99.0 02/23/2012 1845   CBG (last 3)   Basename 02/24/12 0734 02/23/12 2002 02/23/12 1815  GLUCAP 91  148* 137*    Assessment/Plan: S/P Procedure(s) (LRB): CORONARY ARTERY BYPASS GRAFTING (CABG) (N/A) POD # 1 CABG CV- s/p CABG x 2, she has residual 70% stenosis in a small circ marginal vessel  Nonischemic cardiomyopathy- EF 15%- good index- wean dopamine, continue milrinone today  Will add ACE-I tomorrow if renal function and BP allow  RESP - left lower lobe atelectasis on CXR- pulmonary hygiene  RENAL- diurese for volume overload  Hypoglycemic with insulin gtt, transition to Q4 CBG and SSI  D/C CT and arterial lines  Lovenox + SCD for DVT prophylaxis  Protonix for ulcer prophylaxis  OOB     LOS: 7 days    [name],[name] 02/24/2012

## 2012-02-24 NOTE — Op Note (Signed)
NAMETEHANI, MERSMAN NO.:  [id number]  MEDICAL RECORD NO.:  [id number]  LOCATION:  2305                         FACILITY:  [facility]  PHYSICIAN:  Salvatore Decent. Dorris Fetch, M.D.DATE OF BIRTH:  03-Sep-1945  DATE OF PROCEDURE:  02/23/2012 DATE OF DISCHARGE:                              OPERATIVE REPORT   PREOPERATIVE DIAGNOSIS:  Two-vessel coronary artery disease with nonischemic cardiomyopathy and mild-to-moderate mitral regurgitation.  POSTOPERATIVE DIAGNOSIS:  Two-vessel coronary artery disease with nonischemic cardiomyopathy and mild-to-moderate mitral regurgitation.  PROCEDURES: 1. Median sternotomy. 2. Extracorporeal circulation. 3. Coronary artery bypass grafting x2 (left internal mammary artery to     left anterior descending coronary artery, saphenous vein graft to     first diagonal). 4. Epicardial pacemaker lead placement. 5. Myocardial biopsy. 6. Endoscopic vein harvest, right thigh.  SURGEON:  Salvatore Decent. Dorris Fetch, MD  ASSISTANT:  Doree Fudge, PA  ANESTHESIA:  General.  FINDINGS:  Transesophageal echocardiography prebypass revealed severe left ventricular dysfunction with ejection fraction of 15% with a dilated ventricle.  There was mild-to-moderate(1-2+) mitral regurgitation which did not worsen with elevating legs, peripheral vasoconstriction, and volume loading.  Good quality target vessels.  LIMA fair quality due to small size, but good flow.  Saphenous vein good quality.  Postbypass transesophageal echocardiography revealed improved left ventricular wall motion (on inotropes), and improved mild mitral regurgitation.  CLINICAL NOTE:  Ms. Cush is a 66 year old woman with no prior cardiac history, who presented with a 2-week history of progressive shortness of breath and leg swelling, and was found to be in congestive heart failure.  She had an echocardiogram done at [facility], which showed severe left ventricular  dysfunction with ejection fraction of 20%.  There was mild-to-moderate mitral and tricuspid regurgitation. She underwent cardiac catheterization which showed 2-vessel coronary disease with severe complex bifurcation disease in the LAD, involving the LAD and a large diagonal branch.  There was moderate disease in the circumflex involving the distal obtuse marginal.  The patient was evaluated as possible candidate for angioplasty versus coronary artery bypass grafting.  It was felt that coronary artery bypass grafting would provide better revascularization to the LAD and diagonal.  The circumflex marginal affected by the 70% stenosis would not be amenable to grafting.  Repeat echocardiogram showed no significant tricuspid regurgitation, but there was mild-to-moderate mitral regurgitation.  The patient was advised that we would evaluate that intraoperatively to see if she needed mitral annuloplasty at the time of bypass grafting.  We had also discussed with her the possibility of placement of a left ventricular lead in case she would need biventricular pacing at a later time.  She understood and accepted the risks of the procedure and agreed to proceed.  OPERATIVE NOTE:  Ms. Valent was brought to the preoperative holding area on February 23, 2012, there the Anesthesia Service under the direction of Dr. Hart Robinsons placed a Swan-Ganz catheter and arterial blood pressure monitoring line.  She was taken to the operating room, given intravenous antibiotics, anesthetized and intubated.  A Foley catheter was placed.  The chest, abdomen, and legs were prepped and draped in usual sterile fashion.  An attempt was made to place a right femoral arterial blood pressure monitoring  line.  The artery was accessed with a needle, but despite multiple attempts, the wire would never pass easily.  After multiple attempts, it was decided to hold pressure on this area and attempt to access the left  femoral artery.  This was accessed on the first pass of the needle.  There was good blood return.  The wire passed easily.  The catheter was advanced over the wire without difficulty.  It was connected to the pressure monitoring circuit and there was a good waveform.  It was secured with 2-0 silk sutures.  An incision was made in the medial aspect of the right leg at the level of the knee.  The greater saphenous vein was harvested from the lower thigh endoscopically, it was a good quality vessel.  Simultaneously with this, a median sternotomy was performed.  The left internal mammary artery was harvested using standard technique.  It was relatively small vessel.  2000 units of heparin was administered during the vessel harvest.  The completion of the takedown of the mammary artery, it was divided distally and had good flow despite its small size.  The pericardium was opened.  There was a small pericardial effusion.  Of note, there really was not a significant pleural effusion.  The remainder of the full heparin dose was given.  After confirming adequate anticoagulation with ACT measurement, the aorta was cannulated via concentric 2-0 Ethibond pledgeted pursestring sutures.  A dual-stage venous cannula was placed via a pursestring suture in the right atrial appendage.  Cardiopulmonary bypass was instituted and the patient was cooled to 32 degrees Celsius.  The coronary arteries were inspected and anastomotic sites were chosen.  The inspection of lateral wall showed that there was really no way to graft the distal obtuse marginal, which was less than one mm where it could be visualized.  The conduits were inspected and cut to length.  A foam pad was placed in the pericardium to insulate the heart.  A temperature probe was placed in myocardial septum.  A cardioplegic cannula was placed in the ascending aorta.  Prior to cross clamping the aorta, the heart was elevated and a Tru-Cut needle  was used to take a full-thickness biopsy in the anterior wall. This was sent for permanent pathology.  The site had minimal bleeding which was controlled with 6-0 Prolene suture in the epicardial surface.  The aorta was crossclamped.  The left ventricle was emptied via the aortic root vent.  Cardiac arrest then was achieved with cold antegrade blood cardioplegia and topical iced saline.  A 1.5 L of cardioplegia was administered.  There was a rapid diastolic arrest and myocardial septal cooling to 10 degrees Celsius.  The following distal anastomoses were performed.  First, a reversed saphenous vein graft was placed end-to-side to the first diagonal branch to the LAD.  This was a 1.5-mm good quality target vessel.  The vein was of good quality, it was anastomosed end-to-side with a running 7-0 Prolene suture.  There was excellent flow through the graft.  Cardioplegia was administered.  There was good hemostasis.  The left internal mammary artery was brought through a window in the pericardium.  The distal end was beveled.  It was then anastomosed end- to-side to the LAD.  The LAD was a 1.5-mm good quality target.  The mammary was a slightly less than 1.5-mm diameter vessel.  It did have good flow, and was anastomosed end-to-side with a running 8-0 Prolene suture.  At the completion of the  mammary to LAD anastomosis, the bulldog clamp was briefly removed to inspect for hemostasis.  Septal rewarming was noted.  The bulldog clamp was replaced and the mammary pedicle was tacked to the epicardial surface of the heart with 6-0 Prolene sutures.  The vein graft was cut to length.  The cardioplegic cannula was removed from the ascending aorta and the proximal vein graft anastomosis was performed to a 4.5-mm punch aortotomy with a running 6-0 Prolene suture. At the completion of the proximal anastomosis, lidocaine was administered.  The patient was placed in Trendelenburg position.  The aortic  root was de-aired.  The bulldog clamp was again removed from the left mammary artery and septal rewarming was again noted.  After de- airing the aortic root, the aortic crossclamp was removed.  Total crossclamp time was 39 minutes.  The patient required a single defibrillation with 20 joules.  She was in sinus rhythm thereafter.  The proximal and distal anastomoses were inspected for hemostasis.  The heart was elevated and an epicardial pacing lead was placed on the lateral wall.  This was screw-in type lead that was screwed directly into the epicardial surface.  It was a Medtronic lead 5071-53 cm, serial #[id number].  The lead was left in the pleural space and was later tunneled into a subcutaneous pocket adjacent to the sternotomy.  Epicardial pacing wires were placed on the right ventricle and right atrium and DDD pacing was initiated at 90 beats per minute.  The patient was loaded with milrinone and then an infusion was initiated at 0.25 mcg/kg/minute.  A dopamine infusion was initiated at 3 mcg/kg/minute. The patient then was weaned from cardiopulmonary bypass on the first attempt without difficulty.  The total bypass time was 71 minutes. Postbypass transesophageal echocardiography revealed some improvement in left ventricular wall motion, also improvement in the degree of mitral regurgitation from 2+ to 1+.  Her initial cardiac output post bypass was greater than 5 L/minute in comparison to 2.5 L/min prebypass.  The test dose of protamine was administered and was well tolerated.  The atrial and aortic cannulae were removed.  The remainder of the protamine was administered without incident.  The chest was irrigated with warm saline.  The pericardium was reapproximated over the aorta and base of the heart with interrupted 3-0 silk sutures.  The left pleural and single mediastinal chest tubes were placed in separate subcostal incisions and secured with #1 silk sutures.  The sternum  which should be noted was osteoporotic was closed with interrupted heavy-gauge double stainless steel wires.  The pectoralis fascia, subcutaneous tissue, and skin were closed in standard fashion.  All sponge, needle, and instrument counts were correct at the end of the procedure.  The patient was taken from the operating room to the surgical intensive care unit in good condition.     Salvatore Decent Dorris Fetch, M.D.     SCH/MEDQ  D:  02/23/2012  T:  02/24/2012  Job:  [id number]

## 2012-02-25 ENCOUNTER — Inpatient Hospital Stay (HOSPITAL_COMMUNITY): Payer: Medicare Other

## 2012-02-25 LAB — BASIC METABOLIC PANEL
Calcium: 9.1 mg/dL (ref 8.4–10.5)
GFR calc Af Amer: 62 mL/min — ABNORMAL LOW (ref >90)
GFR calc non Af Amer: 53 mL/min — ABNORMAL LOW (ref >90)
Sodium: 133 mEq/L — ABNORMAL LOW (ref 135–145)

## 2012-02-25 LAB — CBC
Platelets: 151 10*3/uL (ref 150–400)
RBC: 2.96 MIL/uL — ABNORMAL LOW (ref 3.87–5.11)
WBC: 12.3 10*3/uL — ABNORMAL HIGH (ref 4.0–10.5)

## 2012-02-25 LAB — GLUCOSE, CAPILLARY
Glucose-Capillary: 102 mg/dL — ABNORMAL HIGH (ref 70–99)
Glucose-Capillary: 118 mg/dL — ABNORMAL HIGH (ref 70–99)
Glucose-Capillary: 129 mg/dL — ABNORMAL HIGH (ref 70–99)
Glucose-Capillary: 139 mg/dL — ABNORMAL HIGH (ref 70–99)
Glucose-Capillary: 143 mg/dL — ABNORMAL HIGH (ref 70–99)
Glucose-Capillary: 161 mg/dL — ABNORMAL HIGH (ref 70–99)
Glucose-Capillary: 162 mg/dL — ABNORMAL HIGH (ref 70–99)
Glucose-Capillary: 55 mg/dL — ABNORMAL LOW (ref 70–99)
Glucose-Capillary: 91 mg/dL (ref 70–99)

## 2012-02-25 MED ORDER — FUROSEMIDE 10 MG/ML IJ SOLN
40.0000 mg | Freq: Once | INTRAMUSCULAR | Status: AC
  Administered 2012-02-25: 40 mg via INTRAVENOUS

## 2012-02-25 NOTE — Progress Notes (Signed)
Patient ID: Dana [name], female   DOB: Jul 24, 1945, 66 y.o.   MRN: [id number]   SICU Evening Rounds:  Hemodynamically stable off dopamine and milrinone.  Good urine output  Transfused today.  Ambulated twice.

## 2012-02-25 NOTE — Progress Notes (Addendum)
2 Days Post-Op Procedure(s) (LRB): CORONARY ARTERY BYPASS GRAFTING (CABG) (N/A) Subjective: No complaints  Objective: Vital signs in last 24 hours: Temp:  [97.7 F (36.5 C)-99.3 F (37.4 C)] 98.2 F (36.8 C) (10/26 0800) Pulse Rate:  [85-120] 120  (10/26 1000) Cardiac Rhythm:  [-] Sinus tachycardia (10/26 0800) Resp:  [12-25] 22  (10/26 1000) BP: (88-133)/(45-80) 116/72 mmHg (10/26 1000) SpO2:  [91 %-99 %] 96 % (10/26 1000) Arterial Line BP: (81-146)/(41-60) 108/59 mmHg (10/26 1000) Weight:  [69.219 kg (152 lb 9.6 oz)] 69.219 kg (152 lb 9.6 oz) (10/26 0500)  Hemodynamic parameters for last 24 hours: PAP: (43-56)/(22-33) 56/33 mmHg CO:  [3.6 L/min-3.7 L/min] 3.6 L/min CI:  [2.2 L/min/m2-2.3 L/min/m2] 2.2 L/min/m2  Intake/Output from previous day: 10/25 0701 - 10/26 0700 In: 1171.4 [I.V.:711.4; IV Piggyback:460] Out: 2095 [Urine:1915; Chest Tube:180] Intake/Output this shift: Total I/O In: 58.9 [I.V.:58.9] Out: 130 [Urine:130]  General appearance: alert and cooperative Neurologic: intact Heart: regular rate and rhythm, S1, S2 normal, no murmur, click, rub or gallop Lungs: diminished breath sounds LLL and RLL Extremities: edema mild Wound: incision ok  Lab Results:  Basename 02/25/12 0340 02/24/12 1635  WBC 12.3* 14.5*  HGB 7.8* 8.3*  HCT 24.8* 26.2*  PLT 151 185   BMET:  Basename 02/25/12 0340 02/24/12 1635 02/24/12 1628 02/24/12 0410  NA 133* -- 135 --  K 4.3 -- 5.2* --  CL 99 -- 103 --  CO2 25 -- -- 24  GLUCOSE 106* -- 124* --  BUN 16 -- 17 --  CREATININE 1.05 1.21* -- --  CALCIUM 9.1 -- -- 8.0*    PT/INR:  Basename 02/23/12 1300  LABPROT 19.1*  INR 1.66*   ABG    Component Value Date/Time   PHART 7.399 02/23/2012 1845   HCO3 25.2* 02/23/2012 1845   TCO2 21 02/24/2012 1628   ACIDBASEDEF 1.0 02/23/2012 1724   O2SAT 99.0 02/23/2012 1845   CBG (last 3)   Basename 02/25/12 0321 02/25/12 0020 02/24/12 1943  GLUCAP 95 129* 111*     Assessment/Plan: S/P Procedure(s) (LRB): CORONARY ARTERY BYPASS GRAFTING (CABG) (N/A) Mobilize Diuresis Continue foley due to diuresing patient, patient in ICU and urinary output monitoring Acute blood loss anemia: With borderline bp on low dose inotropes and poor EF will transfuse 1 unit prbc's. Discussed with patient. Wean milrinone and dopamine as tolerated. Resume Coreg once off inotropes.   LOS: 8 days    Harlea Goetzinger K 02/25/2012

## 2012-02-26 LAB — CBC
MCH: 27 pg (ref 26.0–34.0)
MCV: 86 fL (ref 78.0–100.0)
Platelets: 157 10*3/uL (ref 150–400)
RBC: 3.22 MIL/uL — ABNORMAL LOW (ref 3.87–5.11)
RDW: 17.8 % — ABNORMAL HIGH (ref 11.5–15.5)
WBC: 9.2 10*3/uL (ref 4.0–10.5)

## 2012-02-26 LAB — BASIC METABOLIC PANEL
CO2: 27 mEq/L (ref 19–32)
Calcium: 9.1 mg/dL (ref 8.4–10.5)
Chloride: 99 mEq/L (ref 96–112)
Creatinine, Ser: 0.9 mg/dL (ref 0.50–1.10)
GFR calc Af Amer: 74 mL/min — ABNORMAL LOW (ref >90)
Sodium: 135 mEq/L (ref 135–145)

## 2012-02-26 LAB — TYPE AND SCREEN
ABO/RH(D): A POS
Unit division: 0
Unit division: 0

## 2012-02-26 NOTE — Progress Notes (Signed)
3 Days Post-Op Procedure(s) (LRB): CORONARY ARTERY BYPASS GRAFTING (CABG) (N/A) Subjective: No complaints   Objective: Vital signs in last 24 hours: Temp:  [97.9 F (36.6 C)-98.7 F (37.1 C)] 97.9 F (36.6 C) (10/27 1226) Pulse Rate:  [79-106] 88  (10/27 1400) Cardiac Rhythm:  [-] Normal sinus rhythm (10/27 1200) Resp:  [18-29] 24  (10/27 1400) BP: (101-129)/(52-86) 104/56 mmHg (10/27 1400) SpO2:  [95 %-100 %] 97 % (10/27 1400) Arterial Line BP: (96-136)/(40-61) 100/47 mmHg (10/27 1200) Weight:  [68.6 kg (151 lb 3.8 oz)] 68.6 kg (151 lb 3.8 oz) (10/27 0600)  Hemodynamic parameters for last 24 hours:    Intake/Output from previous day: 10/26 0701 - 10/27 0700 In: 1653 [P.O.:750; I.V.:501; Blood:350; IV Piggyback:52] Out: 2425 [Urine:2425] Intake/Output this shift: Total I/O In: 460 [P.O.:320; I.V.:140] Out: 750 [Urine:750]  General appearance: alert and cooperative Heart: regular rate and rhythm, S1, S2 normal, no murmur, click, rub or gallop Lungs: diminished breath sounds bibasilar Extremities: extremities normal, atraumatic, no cyanosis or edema Wound: incision ok  Lab Results:  Basename 02/26/12 0523 02/25/12 0340  WBC 9.2 12.3*  HGB 8.7* 7.8*  HCT 27.7* 24.8*  PLT 157 151   BMET:  Basename 02/26/12 0523 02/25/12 0340  NA 135 133*  K 4.3 4.3  CL 99 99  CO2 27 25  GLUCOSE 108* 106*  BUN 19 16  CREATININE 0.90 1.05  CALCIUM 9.1 9.1    PT/INR: No results found for this basename: LABPROT,INR in the last 72 hours ABG    Component Value Date/Time   PHART 7.399 02/23/2012 1845   HCO3 25.2* 02/23/2012 1845   TCO2 21 02/24/2012 1628   ACIDBASEDEF 1.0 02/23/2012 1724   O2SAT 99.0 02/23/2012 1845   CBG (last 3)   Basename 02/25/12 0735 02/25/12 0321 02/25/12 0020  GLUCAP 102* 95 129*    Assessment/Plan: S/P Procedure(s) (LRB): CORONARY ARTERY BYPASS GRAFTING (CABG) (N/A) Mobilize Continue foley due to patient in ICU and urinary output monitoring BP  is probably still too low to tolerate Coreg.  LOS: 9 days    BARTLE,BRYAN K 02/26/2012

## 2012-02-27 ENCOUNTER — Inpatient Hospital Stay (HOSPITAL_COMMUNITY): Payer: Medicare Other

## 2012-02-27 ENCOUNTER — Encounter (HOSPITAL_COMMUNITY): Payer: Self-pay | Admitting: Thoracic Surgery (Cardiothoracic Vascular Surgery)

## 2012-02-27 LAB — CBC
MCH: 27.8 pg (ref 26.0–34.0)
MCV: 89 fL (ref 78.0–100.0)
Platelets: 215 10*3/uL (ref 150–400)
RDW: 18.3 % — ABNORMAL HIGH (ref 11.5–15.5)

## 2012-02-27 LAB — BASIC METABOLIC PANEL
Calcium: 8.8 mg/dL (ref 8.4–10.5)
Creatinine, Ser: 0.73 mg/dL (ref 0.50–1.10)
GFR calc Af Amer: >90 mL/min (ref >90)
GFR calc non Af Amer: 86 mL/min — ABNORMAL LOW (ref >90)
Sodium: 137 mEq/L (ref 135–145)

## 2012-02-27 MED ORDER — LISINOPRIL 5 MG PO TABS
5.0000 mg | ORAL_TABLET | Freq: Every day | ORAL | Status: DC
  Administered 2012-02-27 – 2012-02-29 (×3): 5 mg via ORAL
  Filled 2012-02-27 (×3): qty 1

## 2012-02-27 MED ORDER — CARVEDILOL 3.125 MG PO TABS
3.1250 mg | ORAL_TABLET | Freq: Two times a day (BID) | ORAL | Status: DC
  Administered 2012-02-27 – 2012-02-29 (×4): 3.125 mg via ORAL
  Filled 2012-02-27 (×6): qty 1

## 2012-02-27 MED FILL — Electrolyte-R (PH 7.4) Solution: INTRAVENOUS | Qty: 5000 | Status: AC

## 2012-02-27 MED FILL — Lidocaine HCl IV Inj 20 MG/ML: INTRAVENOUS | Qty: 5 | Status: AC

## 2012-02-27 MED FILL — Heparin Sodium (Porcine) Inj 1000 Unit/ML: INTRAMUSCULAR | Qty: 30 | Status: AC

## 2012-02-27 MED FILL — Mannitol IV Soln 20%: INTRAVENOUS | Qty: 500 | Status: AC

## 2012-02-27 MED FILL — Sodium Chloride IV Soln 0.9%: INTRAVENOUS | Qty: 1000 | Status: AC

## 2012-02-27 MED FILL — Sodium Bicarbonate IV Soln 8.4%: INTRAVENOUS | Qty: 50 | Status: AC

## 2012-02-27 MED FILL — Sodium Chloride Irrigation Soln 0.9%: Qty: 3000 | Status: AC

## 2012-02-27 MED FILL — Heparin Sodium (Porcine) Inj 1000 Unit/ML: INTRAMUSCULAR | Qty: 10 | Status: AC

## 2012-02-27 NOTE — Progress Notes (Signed)
4 Days Post-Op Procedure(s) (LRB): CORONARY ARTERY BYPASS GRAFTING (CABG) (N/A) Subjective: [name]/o itching from psoriasis Not much incisional pain + BM today  Objective: Vital signs in last 24 hours: Temp:  [98 F (36.7 [name])-98.8 F (37.1 [name])] 98.8 F (37.1 [name]) (10/28 1141) Pulse Rate:  [72-100] 95  (10/28 1300) Cardiac Rhythm:  [-] Normal sinus rhythm (10/28 1200) Resp:  [16-28] 20  (10/28 1300) BP: (88-138)/(42-71) 113/61 mmHg (10/28 1300) SpO2:  [86 %-100 %] 95 % (10/28 1300) Weight:  [151 lb 0.2 oz (68.5 kg)] 151 lb 0.2 oz (68.5 kg) (10/28 0600)  Hemodynamic parameters for last 24 hours:    Intake/Output from previous day: 10/27 0701 - 10/28 0700 In: 630 [P.O.:490; I.V.:140] Out: 1775 [Urine:1775] Intake/Output this shift: Total I/O In: 300 [P.O.:300] Out: 450 [Urine:450]  General appearance: alert and no distress Neurologic: intact Heart: tachy, regular Lungs: diminished breath sounds bibasilar and left > right Extremities: edema 1+ Wound: clean and dry  Lab Results:  Basename 02/27/12 0545 02/26/12 0523  WBC 7.2 9.2  HGB 8.8* 8.7*  HCT 28.2* 27.7*  PLT 215 157   BMET:  Basename 02/27/12 0545 02/26/12 0523  NA 137 135  K 4.1 4.3  CL 102 99  CO2 31 27  GLUCOSE 88 108*  BUN 17 19  CREATININE 0.73 0.90  CALCIUM 8.8 9.1    PT/INR: No results found for this basename: LABPROT,INR in the last 72 hours ABG    Component Value Date/Time   PHART 7.399 02/23/2012 1845   HCO3 25.2* 02/23/2012 1845   TCO2 21 02/24/2012 1628   ACIDBASEDEF 1.0 02/23/2012 1724   O2SAT 99.0 02/23/2012 1845   CBG (last 3)   Basename 02/25/12 0735 02/25/12 0321 02/25/12 0020  GLUCAP 102* 95 129*    Assessment/Plan: S/P Procedure(s) (LRB): CORONARY ARTERY BYPASS GRAFTING (CABG) (N/A) - Overall doing well, but progressing slowly CV- dilated cardiomyopathy- will try to add in coreg and lisinopril- watch BP  RESP- LLL atelectasis and consolidation- flutter valve, IS  RENAL-  lytes, creatinine OK, continue diuresis  Anemia secondary to ABL- continue to follow  Continue ambulation    LOS: 10 days    Dana [name] 02/27/2012

## 2012-02-27 NOTE — Care Management Note (Signed)
    Page 1 of 1   02/29/2012     2:17:35 PM   CARE MANAGEMENT NOTE 02/29/2012  Patient:  Dana [name], Dana [name]   Account Number:  [id number]  Date Initiated:  02/24/2012  Documentation initiated by:  Avie Arenas  Subjective/Objective Assessment:   Post op CABG on 10-24.     Action/Plan:   PTA, PT INDEPENDENT, LIVES WITH SONS.   Anticipated DC Date:  02/29/2012   Anticipated DC Plan:  HOME W HOME HEALTH SERVICES      DC Planning Services  CM consult      Choice offered to / List presented to:             Status of service:  Completed, signed off Medicare Important Message given?   (If response is "NO", the following Medicare IM given date fields will be blank) Date Medicare IM given:   Date Additional Medicare IM given:    Discharge Disposition:  HOME/SELF CARE  Per UR Regulation:  Reviewed for med. necessity/level of care/duration of stay  If discussed at Long Length of Stay Meetings, dates discussed:    Comments:  Contact:  Ashe,John Farrell [facility] [phone]  02/29/12 Swetha Rayle,RN,BSN [phone] PT FOR DC HOME TODAY.  AMBULATING WELL WITHOUT ASSISTIVE DEVICE.  PT DENIES ANY NEEDS FOR HOME.  02-28-12 3:25pm Avie Arenas, RNBSN [phone] Tx to acute, tele today - probably dc tomorrow - no needs noted.  02-27-12 2:10pm Avie Arenas, RNBSN [phone] 4 day post op CABG - Lives with son - daughter and another son close.  Planning for discharge home.  Probably need HH.

## 2012-02-27 NOTE — Consult Note (Addendum)
Wound consult requested for "bleeding sores."   Pt does not have any open wounds or drainage, but has multiple red lesions which she has scratched and caused to bleed to skin, especially upper legs.  Appearance consistent with possible psoriasis or another skin condition.  This is beyond [facility] scope of practice.   Please consult dermatology for further plan of care.  Pt states she has had this condition for 4 years and has never seen a dermatologist. She could benefit from prescription cream to ease symptoms. Will not plan to follow further unless re-consulted.  [address] RN, MSN, [employer]  [phone]

## 2012-02-27 NOTE — Progress Notes (Signed)
Comfortable up in chair Ambulated well today BP 97/54  Pulse 87  Temp 98.8 F (37.1 C) (Oral)  Resp 24  Ht 5\' 1"  (1.549 m)  Wt 151 lb 0.2 oz (68.5 kg)  BMI 28.53 kg/m2  SpO2 94%  Intake/Output Summary (Last 24 hours) at 02/27/12 1916 Last data filed at 02/27/12 1800  Gross per 24 hour  Intake    560 ml  Output   1150 ml  Net   -590 ml   Stable day, transfer 2000 in AM

## 2012-02-28 LAB — COMPREHENSIVE METABOLIC PANEL
ALT: 12 U/L (ref 0–35)
AST: 15 U/L (ref 0–37)
Albumin: 2.6 g/dL — ABNORMAL LOW (ref 3.5–5.2)
Chloride: 101 mEq/L (ref 96–112)
Creatinine, Ser: 0.74 mg/dL (ref 0.50–1.10)
Potassium: 4.2 mEq/L (ref 3.5–5.1)
Sodium: 139 mEq/L (ref 135–145)
Total Bilirubin: 0.4 mg/dL (ref 0.3–1.2)

## 2012-02-28 LAB — CBC
Platelets: 251 10*3/uL (ref 150–400)
RBC: 3.19 MIL/uL — ABNORMAL LOW (ref 3.87–5.11)
RDW: 18.4 % — ABNORMAL HIGH (ref 11.5–15.5)
WBC: 8.1 10*3/uL (ref 4.0–10.5)

## 2012-02-28 MED ORDER — GUAIFENESIN-DM 100-10 MG/5ML PO SYRP: 15.0000 mL | ORAL_SOLUTION | ORAL | Status: DC | PRN

## 2012-02-28 MED ORDER — IPRATROPIUM BROMIDE 0.02 % IN SOLN: 0.5000 mg | RESPIRATORY_TRACT | Status: DC | PRN

## 2012-02-28 MED ORDER — SODIUM CHLORIDE 0.9 % IJ SOLN: 3.0000 mL | INTRAMUSCULAR | Status: DC | PRN

## 2012-02-28 MED ORDER — ALPRAZOLAM 0.25 MG PO TABS
0.2500 mg | ORAL_TABLET | Freq: Four times a day (QID) | ORAL | Status: DC | PRN
  Administered 2012-02-28: 0.25 mg via ORAL
  Filled 2012-02-28: qty 1

## 2012-02-28 MED ORDER — ALUM & MAG HYDROXIDE-SIMETH 200-200-20 MG/5ML PO SUSP: 15.0000 mL | ORAL | Status: DC | PRN

## 2012-02-28 MED ORDER — FUROSEMIDE 20 MG PO TABS
20.0000 mg | ORAL_TABLET | Freq: Every day | ORAL | Status: DC
  Administered 2012-02-28: 20 mg via ORAL
  Filled 2012-02-28 (×2): qty 1

## 2012-02-28 MED ORDER — MAGNESIUM HYDROXIDE 400 MG/5ML PO SUSP: 30.0000 mL | Freq: Every day | ORAL | Status: DC | PRN

## 2012-02-28 MED ORDER — MOVING RIGHT ALONG BOOK
Freq: Once | Status: AC
  Administered 2012-02-28: 11:00:00
  Filled 2012-02-28: qty 1

## 2012-02-28 MED ORDER — POTASSIUM CHLORIDE CRYS ER 20 MEQ PO TBCR
20.0000 meq | EXTENDED_RELEASE_TABLET | Freq: Every day | ORAL | Status: DC
  Administered 2012-02-28 – 2012-02-29 (×2): 20 meq via ORAL
  Filled 2012-02-28 (×2): qty 1

## 2012-02-28 MED ORDER — SODIUM CHLORIDE 0.9 % IV SOLN: 250.0000 mL | INTRAVENOUS | Status: DC | PRN

## 2012-02-28 MED ORDER — SODIUM CHLORIDE 0.9 % IJ SOLN
3.0000 mL | Freq: Two times a day (BID) | INTRAMUSCULAR | Status: DC
  Administered 2012-02-28 – 2012-02-29 (×2): 3 mL via INTRAVENOUS

## 2012-02-28 MED ORDER — ALBUTEROL SULFATE (5 MG/ML) 0.5% IN NEBU: 2.5000 mg | INHALATION_SOLUTION | RESPIRATORY_TRACT | Status: DC | PRN

## 2012-02-28 MED ORDER — ZOLPIDEM TARTRATE 5 MG PO TABS: 5.0000 mg | ORAL_TABLET | Freq: Every evening | ORAL | Status: DC | PRN

## 2012-02-28 MED FILL — Magnesium Sulfate Inj 50%: INTRAMUSCULAR | Qty: 10 | Status: AC

## 2012-02-28 MED FILL — Potassium Chloride Inj 2 mEq/ML: INTRAVENOUS | Qty: 40 | Status: AC

## 2012-02-28 NOTE — Progress Notes (Signed)
Pt transferred to 2010 at 1000. Report given to [place]. Pt was AAO, vital signs were WNL pt stated she had no pain. Lungs were clear and diminished and pt was in NSR with a HR in the 80's.

## 2012-02-28 NOTE — Progress Notes (Signed)
5 Days Post-Op Procedure(s) (LRB): CORONARY ARTERY BYPASS GRAFTING (CABG) (N/A) Subjective: No complaints, wants to go home   Objective: Vital signs in last 24 hours: Temp:  [98.1 F (36.7 C)-98.8 F (37.1 C)] 98.3 F (36.8 C) (10/29 0000) Pulse Rate:  [70-101] 86  (10/29 0700) Cardiac Rhythm:  [-] Normal sinus rhythm (10/29 0400) Resp:  [16-32] 22  (10/29 0700) BP: (88-138)/(43-70) 106/54 mmHg (10/29 0700) SpO2:  [92 %-100 %] 92 % (10/29 0700) Weight:  [142 lb 3.2 oz (64.5 kg)] 142 lb 3.2 oz (64.5 kg) (10/29 0500)  Hemodynamic parameters for last 24 hours:    Intake/Output from previous day: 10/28 0701 - 10/29 0700 In: 900 [P.O.:900] Out: 1600 [Urine:1600] Intake/Output this shift:    General appearance: alert and no distress Neurologic: intact Heart: regular rate and rhythm Lungs: clear to auscultation bilaterally Extremities: edema trace Wound: clean and dry  Lab Results:  [facility] 02/28/12 0220 02/27/12 0545  WBC 8.1 7.2  HGB 8.8* 8.8*  HCT 28.3* 28.2*  PLT 251 215   BMET:  Basename 02/28/12 0220 02/27/12 0545  NA 139 137  K 4.2 4.1  CL 101 102  CO2 31 31  GLUCOSE 91 88  BUN 18 17  CREATININE 0.74 0.73  CALCIUM 8.9 8.8    PT/INR: No results found for this basename: LABPROT,INR in the last 72 hours ABG    Component Value Date/Time   PHART 7.399 02/23/2012 1845   HCO3 25.2* 02/23/2012 1845   TCO2 21 02/24/2012 1628   ACIDBASEDEF 1.0 02/23/2012 1724   O2SAT 99.0 02/23/2012 1845   CBG (last 3)  No results found for this basename: GLUCAP:3 in the last 72 hours  Assessment/Plan: S/P Procedure(s) (LRB): CORONARY ARTERY BYPASS GRAFTING (CABG) (N/A) Plan for transfer to step-down: see transfer orders Looks great this morning  CV- cardiomyopathy- s/p CABG, coreg, ACE-I titrate as BP allows  RESP- continue pulmonary hygiene  RENAL- lytes, creatinine OK  Transfer to 2000     LOS: 11 days    Joe Gee C 02/28/2012

## 2012-02-28 NOTE — Progress Notes (Signed)
Provided pt and son education regarding sodium intake. Gave pt example of foods that were high in sodium. Pt and son verbalized understanding. Also provided education regarding IS use. Pt demonstrated use of IS.  Called respiratory for a flutter valve. Will continue to monitor pt.

## 2012-02-28 NOTE — Progress Notes (Signed)
CARDIAC REHAB PHASE I   PRE:  Rate/Rhythm: 91 SR    BP: sitting 130/60    SaO2: 96  RA  MODE:  Ambulation: 450 ft   POST:  Rate/Rhythm: 110 ST    BP: sitting 120/64     SaO2: 95 RA  Steady, tolerated fairly well without assist. No RW. x1 rest for SOB. Will continue to follow. [id number]  Harriet Masson CES, ACSM

## 2012-02-29 LAB — BASIC METABOLIC PANEL
Chloride: 97 mEq/L (ref 96–112)
GFR calc Af Amer: >90 mL/min (ref >90)
Potassium: 4 mEq/L (ref 3.5–5.1)

## 2012-02-29 LAB — CBC
HCT: 30.9 % — ABNORMAL LOW (ref 36.0–46.0)
Platelets: 325 10*3/uL (ref 150–400)
RDW: 18.5 % — ABNORMAL HIGH (ref 11.5–15.5)
WBC: 8.2 10*3/uL (ref 4.0–10.5)

## 2012-02-29 LAB — PRO B NATRIURETIC PEPTIDE: Pro B Natriuretic peptide (BNP): 6215 pg/mL — ABNORMAL HIGH (ref 0–125)

## 2012-02-29 MED ORDER — ASPIRIN 325 MG PO TBEC: 325.0000 mg | DELAYED_RELEASE_TABLET | Freq: Every day | ORAL | Status: DC

## 2012-02-29 MED ORDER — OXYCODONE HCL 5 MG PO TABS: 5.0000 mg | ORAL_TABLET | ORAL | Status: DC | PRN

## 2012-02-29 MED ORDER — LISINOPRIL 5 MG PO TABS: 5.0000 mg | ORAL_TABLET | Freq: Every day | ORAL | Status: DC

## 2012-02-29 MED ORDER — FUROSEMIDE 40 MG PO TABS
40.0000 mg | ORAL_TABLET | Freq: Every day | ORAL | Status: DC
  Filled 2012-02-29: qty 1

## 2012-02-29 MED ORDER — FUROSEMIDE 40 MG PO TABS: 40.0000 mg | ORAL_TABLET | Freq: Every day | ORAL | Status: DC

## 2012-02-29 MED ORDER — FERROUS SULFATE 325 (65 FE) MG PO TABS: 325.0000 mg | ORAL_TABLET | Freq: Every day | ORAL | Status: DC

## 2012-02-29 MED ORDER — CARVEDILOL 3.125 MG PO TABS: 3.1250 mg | ORAL_TABLET | Freq: Two times a day (BID) | ORAL | Status: DC

## 2012-02-29 MED ORDER — POTASSIUM CHLORIDE CRYS ER 20 MEQ PO TBCR: 20.0000 meq | EXTENDED_RELEASE_TABLET | Freq: Every day | ORAL | Status: DC

## 2012-02-29 MED ORDER — NICOTINE 7 MG/24HR TD PT24: 1.0000 | MEDICATED_PATCH | Freq: Every day | TRANSDERMAL | Status: DC

## 2012-02-29 MED ORDER — ATORVASTATIN CALCIUM 80 MG PO TABS: 80.0000 mg | ORAL_TABLET | Freq: Every day | ORAL | Status: DC

## 2012-02-29 NOTE — Discharge Summary (Signed)
Physician Discharge Summary  Patient ID: Dana [name] MRN: [id number] DOB/AGE: 1946-04-27 66 y.o.  Admit date: 02/17/2012 Discharge date: 02/29/2012  Admission Diagnoses: 1.Acute coronary syndrome 2.Multivessel CAD (LVEF 15%) 3.LBBB 4.Mild to moderate MR 5.Mild to moderate TR 6.Acute CHF 7.Dilated cardiomyopathy 8.History of tobacco abuse 9.History of psoriasis  Discharge Diagnoses:  1.Acute coronary syndrome 2.Multivessel CAD (LVEF 15%) 3.LBBB 4.Mild to moderate MR 5.Mild to moderate TR 6.Acute CHF 7.Dilated cardiomyopathy 8.History of tobacco abuse 9.History of psoriasis 8.ABL anemia  Procedure (s):   1. Median sternotomy.  2. Extracorporeal circulation.  3. Coronary artery bypass grafting x2 (left internal mammary artery to left anterior descending coronary artery, saphenous vein graft to first diagonal).  4. Epicardial pacemaker lead placement.  5. Myocardial biopsy.  6. Endoscopic vein harvest, right thigh by Dr. Dorris Fetch on 02/22/2012.  Pathology: Biopsy of the myocardium showed myocyte hypertrophy, no evidence of infarction and no tumor.  History of Presenting Illness: This is a 66 year old Caucasian female with no prior cardiac history who presented with a 2 week history of shortness of breath and leg swelling. She has a history of tobacco use and a family history of heart disease, but no other known cardiac risk factors. She was in her usual state of health until 2 weeks ago. She first noted sneezing and sinus congestion as well as congestion in her chest. She thought this was seasonal allergies, which she has every Spring and Fall. However, she began having more of a sensation of congestion in her chest and shortness of breath with exertion. Shortly thereafter, she developed orthopnea and swelling in her legs L > R. She then went to urgent care and was admitted to [facility] with CHF. She ruled out for acute MI. An echo showed severe LV dysfunction,  an EF of 20%, and mild to moderate MR and TR. A cardiac catheterization was done and showed two vessel coronary artery disease. She was transferred to [facility] for further evaluation and management.       She was admitted by Dr. Riley Kill who after reviewing the cardiac catheterization, decided to obtain a cardiothoracic consultation with Dr. Dorris Fetch.  It was discussed with the patient the need for coronary artery bypass grafting surgery. Potential risks, benefits, and complications were discussed and she agreed to proceed. Dr. Gala Romney performed a TEE on 02/22/2012. Results showed a moderately dilated LV with diffuse severe hypokinesis, dilated LA, no AS/AI, mild to moderate MR, trivial TR, and no pericardial effusion. Pre operative carotid US showed no significant bilateral carotid artery stenosis.After the patient was medically optimized, she underwent a CABG x 2, myocardial biopsy, and epicardial pacemaker lead placement on 02/22/2012.  Brief Hospital Course:  She was extubated the evening of surgery. She remained afebrile and hemodynamically stable. Her Theone Murdoch, a line, and chest tubes were all removed in her post operative course.Her foley did remain in for a couple of day for close UO monitoring. She was weaned off of Dopamine and Milrinone.She was later started on Coreg. She maintained SR. She did have ABL anemia. Her H and H went as low as 6.9 and 23.2. She did receive a transfusion. Follow up H and H was 8.2 and 24. Her H and H did decrease to 7.8 and 24.8 again on post operative day 3. She received another transfusion.She was placed on Ferrous Sulfate. Her last H and H on 10/30 was up to 9.7 and 30.9. She was volume overloaded and diuresed accordingly.  She was then  placed on low dose Lisinopril. She was felt surgically stable for transfer from the ICU to [facility] for further convalescence on 02/28/2012. Her accu checks were discontinued upon transfer from the ICU as her glucose remained below  130. Her pre op HGA1C was 5.4.  She has been tolerating a diet and has had a bowel movement. Her epicardial pacing wires and chest tube sutures will be removed today. She has been seen and evaluated by Dr. Dorris Fetch and is felt surgically stable for discharge today.  Latest Vital Signs: Blood pressure 128/80, pulse 89, temperature 97.5 F (36.4 C), temperature source Oral, resp. rate 18, height 5\' 1"  (1.549 m), weight 149 lb 3.2 oz (67.677 kg), SpO2 94.00%.  Physical Exam: Cardiovascular: RRR  Pulmonary: Clear to auscultation bilaterally; no rales, wheezes, or rhonchi.  Abdomen: Soft, non tender, bowel sounds present.  Extremities: Mild bilateral lower extremity edema.  Wounds: Clean and dry. No erythema or signs of infection.  Discharge Condition:Stable  Recent laboratory studies:  Lab Results  Component Value Date   WBC 8.2 02/29/2012   HGB 9.7* 02/29/2012   HCT 30.9* 02/29/2012   MCV 89.0 02/29/2012   PLT 325 02/29/2012   Lab Results  Component Value Date   NA 135 02/29/2012   K 4.0 02/29/2012   CL 97 02/29/2012   CO2 29 02/29/2012   CREATININE 0.79 02/29/2012   GLUCOSE 90 02/29/2012      Diagnostic Studies: Dg Chest Port 1 View  02/27/2012  *RADIOLOGY REPORT*  Clinical Data: Follow-up CABG  PORTABLE CHEST - 1 VIEW  Comparison: 02/25/2012  Findings: Previous median sternotomy and CABG.  Epicardial lead remains in place.  The right chest remains clear.  There is left lower lobe collapse with a small amount of pleural fluid on the left.  No pneumothorax.  IMPRESSION: Left lower lobe collapse with a small amount of left pleural fluid. No pneumothorax.   Original Report Authenticated By: Thomasenia Sales, M.D.     Discharge Medications:   Medication List     As of 02/29/2012  8:34 AM    STOP taking these medications         ibuprofen 200 MG tablet   Commonly known as: ADVIL,MOTRIN      TAKE these medications         acetaminophen 500 MG tablet   Commonly known  as: TYLENOL   Take 1,000 mg by mouth every 6 (six) hours as needed. For pain      aspirin 325 MG EC tablet   Take 1 tablet (325 mg total) by mouth daily.      atorvastatin 80 MG tablet   Commonly known as: LIPITOR   Take 1 tablet (80 mg total) by mouth daily at 6 PM.      carvedilol 3.125 MG tablet   Commonly known as: COREG   Take 1 tablet (3.125 mg total) by mouth 2 (two) times daily with a meal.      ferrous sulfate 325 (65 FE) MG tablet   Take 1 tablet (325 mg total) by mouth daily with breakfast. For one month then stop.      furosemide 40 MG tablet   Commonly known as: LASIX   Take 1 tablet (40 mg total) by mouth daily. For 5 days then stop.      lisinopril 5 MG tablet   Commonly known as: PRINIVIL,ZESTRIL   Take 1 tablet (5 mg total) by mouth daily.      nicotine 7 mg/24hr  patch   Commonly known as: NICODERM CQ - dosed in mg/24 hr   Place 1 patch onto the skin daily.      oxyCODONE 5 MG immediate release tablet   Commonly known as: Oxy IR/ROXICODONE   Take 1 tablet (5 mg total) by mouth every 4 (four) hours as needed for pain.      potassium chloride SA 20 MEQ tablet   Commonly known as: K-DUR,KLOR-CON   Take 1 tablet (20 mEq total) by mouth daily. For 5 days then stop.       The patient has been discharged on:   1.Beta Blocker:  Yes [ x  ]                              No   [   ]                              If No, reason:  2.Ace Inhibitor/ARB: Yes [  x ]                                     No  [    ]                                     If No, reason:  3.Statin:   Yes [  x ]                  No  [   ]                  If No, reason:  4.Ecasa:  Yes  [ x  ]                  No   [   ]                  If No, reason:  Follow Up Appointments:     Follow-up Information    Follow up with Loreli Slot, MD. (PA/LAT CXR to be taken (at [facility] which is in the same building as Dr. Sunday Corn office) 1 hour prior to office appointment  with Dr. Earmon Phoenix will contact you with an appointment date and time)    Contact information:   [address] [place] [address] [phone]       Follow up with Dr. Dossie Arbour , MD. (Call for a follow up appointment for 2 weeks)    Contact information:   [address] [place] [address] [phone]          Signed: Doree Fudge MPA-C 02/29/2012, 8:34 AM

## 2012-02-29 NOTE — Progress Notes (Addendum)
                   [address]            [place] [address]          [phone]      6 Days Post-Op Procedure(s) (LRB): CORONARY ARTERY BYPASS GRAFTING (CABG) (N/A)  Subjective: Patient without complaints. She really wants to go home.  Objective: Vital signs in last 24 hours: Temp:  [97.5 F (36.4 C)-98.5 F (36.9 C)] 97.5 F (36.4 C) (10/30 0451) Pulse Rate:  [87-103] 89  (10/29 2034) Cardiac Rhythm:  [-] Sinus tachycardia;Normal sinus rhythm (10/29 2000) Resp:  [18-24] 18  (10/30 0451) BP: (104-129)/(35-81) 128/80 mmHg (10/30 0451) SpO2:  [91 %-96 %] 94 % (10/30 0451) Weight:  [149 lb 3.2 oz (67.677 kg)] 149 lb 3.2 oz (67.677 kg) (10/30 0414)  Pre op weight  65 kg Current Weight  02/29/12 149 lb 3.2 oz (67.677 kg)      Intake/Output from previous day: 10/29 0701 - 10/30 0700 In: 393 [P.O.:390; I.V.:3] Out: 750 [Urine:750]   Physical Exam:  Cardiovascular: RRR Pulmonary: Clear to auscultation bilaterally; no rales, wheezes, or rhonchi. Abdomen: Soft, non tender, bowel sounds present. Extremities: Mild bilateral lower extremity edema. Wounds: Clean and dry.  No erythema or signs of infection.  Lab Results: CBC: Basename 02/29/12 0505 02/28/12 0220  WBC 8.2 8.1  HGB 9.7* 8.8*  HCT 30.9* 28.3*  PLT 325 251   BMET:  Basename 02/29/12 0505 02/28/12 0220  NA 135 139  K 4.0 4.2  CL 97 101  CO2 29 31  GLUCOSE 90 91  BUN 17 18  CREATININE 0.79 0.74  CALCIUM 9.2 8.9    PT/INR:  Lab Results  Component Value Date   INR 1.66* 02/23/2012   INR 1.11 02/18/2012   ABG:  INR: Will add last result for INR, ABG once components are confirmed Will add last 4 CBG results once components are confirmed  Assessment/Plan:  1. CV - SR. Continue Coreg 3.125 bid, Lisinopril 5 daily 2.  Pulmonary - Encourage incentive spirometer. 3. Volume Overload - Continue with diuresis. 4.  Acute blood loss anemia - H and H stable at 9.7 and 30.9 5.Remove  EPW and CT sutures 6.Discharge later today.    ZIMMERMAN,DONIELLE MPA-C 02/29/2012,7:47 AM    Patient seen and examined. Agree with above. Ready for d/c Continue coreg, ACE-I for CHF

## 2012-02-29 NOTE — Progress Notes (Signed)
[id number] Education completed with pt. Permission given to refer to [facility] Phase 2. Smoking cessation discussed. Handouts given. Pt states has not smoked in 3.5 weeks. Gave pt CHF booklet and reviewed purpose of daily weights and when to call MD. Pt states son has gotten her scales for home. Theodus Ran DunlapRN

## 2012-02-29 NOTE — Progress Notes (Signed)
EPW removed per Lutricia Feil d/t some difficulty. EPW intact with no bleeding noted. CT sutures removed per orders. Post activity explained to patient. Patient instructed to lay in bed supine for 1 hour post EPW removal. VSS. Call bell near. Dana [name]

## 2012-02-29 NOTE — Progress Notes (Signed)
Pt ambulated hallway without RW. Pt denied SOB or pain.

## 2012-03-14 ENCOUNTER — Encounter: Payer: Self-pay | Admitting: Thoracic Surgery (Cardiothoracic Vascular Surgery)

## 2012-03-16 ENCOUNTER — Other Ambulatory Visit: Payer: Self-pay | Admitting: Thoracic Surgery (Cardiothoracic Vascular Surgery)

## 2012-03-16 DIAGNOSIS — Z951 Presence of aortocoronary bypass graft: Secondary | ICD-10-CM

## 2012-03-20 ENCOUNTER — Encounter: Payer: Self-pay | Admitting: Thoracic Surgery (Cardiothoracic Vascular Surgery)

## 2012-03-20 ENCOUNTER — Ambulatory Visit
Admission: RE | Admit: 2012-03-20 | Discharge: 2012-03-20 | Disposition: A | Discharge status: Home / Self Care | Payer: Medicare Other | Source: Ambulatory Visit | Attending: Thoracic Surgery (Cardiothoracic Vascular Surgery) | Admitting: Thoracic Surgery (Cardiothoracic Vascular Surgery)

## 2012-03-20 ENCOUNTER — Ambulatory Visit (INDEPENDENT_AMBULATORY_CARE_PROVIDER_SITE_OTHER): Payer: Self-pay | Admitting: Thoracic Surgery (Cardiothoracic Vascular Surgery)

## 2012-03-20 VITALS — BP 130/78 | HR 93 | Resp 18 | Ht 61.0 in | Wt 141.1 lb

## 2012-03-20 DIAGNOSIS — Z951 Presence of aortocoronary bypass graft: Secondary | ICD-10-CM

## 2012-03-20 DIAGNOSIS — I2510 Atherosclerotic heart disease of native coronary artery without angina pectoris: Secondary | ICD-10-CM

## 2012-03-20 NOTE — Progress Notes (Signed)
HPI:  Dana [name] returns today for a scheduled postoperative followup visit. She had presented with congestive heart failure back in October. She had severe left ventricular dysfunction with an EF of 15%. She had coronary bypass grafting x2 on 02/23/2012. We also placed an epicardial pacemaker lead on the posterior lateral wall in case she needs a BiV Pacer placed eventually. She did well postoperatively and was discharged home on October 30.  Since discharge she has done well. She has not had any problems with swelling or shortness of breath. Her incisional pain well controlled she's not having to take narcotics. She feels well as walking on a regular basis. She wants to know ifshe can get off some of the medications that she's been started on.   Past Medical History  Diagnosis Date  . Psoriasis   . Complication of anesthesia     "difficulty waking up, it was all day long"  Dilated cardiomyopathy Coronary artery disease Status post coronary bypass grafting x2 02/23/2012    Current Outpatient Prescriptions  Medication Sig Dispense Refill  . acetaminophen (TYLENOL) 500 MG tablet Take 1,000 mg by mouth every 6 (six) hours as needed. For pain      . aspirin EC 325 MG EC tablet Take 1 tablet (325 mg total) by mouth daily.  30 tablet    . atorvastatin (LIPITOR) 80 MG tablet Take 1 tablet (80 mg total) by mouth daily at 6 PM.  30 tablet  1  . carvedilol (COREG) 3.125 MG tablet Take 1 tablet (3.125 mg total) by mouth 2 (two) times daily with a meal.  60 tablet  1  . ferrous sulfate 325 (65 FE) MG tablet Take 1 tablet (325 mg total) by mouth daily with breakfast. For one month then stop.      [name] lisinopril (PRINIVIL,ZESTRIL) 5 MG tablet Take 1 tablet (5 mg total) by mouth daily.  30 tablet  1  . nicotine (NICODERM CQ - DOSED IN MG/24 HR) 7 mg/24hr patch Place 1 patch onto the skin daily.  28 patch  1    Physical Exam BP 130/78  Pulse 93  Resp 18  Ht 5\' 1"  (1.549 m)  Wt 141 lb 1.6 oz  (64.003 kg)  BMI 26.66 kg/m2  SpO2 97%  general 66 year old woman in no acute distress Neurologic alert and oriented x3 with no deficits  lungs clear with equal breath sounds bilaterally Cardiac regular rate and rhythm normal S1 and S2 positive S4, faint systolic murmur Sternum stable, incision clean dry and intact Leg incision well-healed No peripheral edema  Diagnostic Tests Chest x-ray 03/20/2012: *RADIOLOGY REPORT*  Clinical Data: Bypass surgery.  CHEST - 2 VIEW  Comparison: 02/27/2012.  Findings: Epicardial pacer wire is still in place. The cardiac  silhouette, mediastinal and hilar contours appear stable. The left  lung is better aerated. No definite residual pleural fluid.  Minimal streaky basilar atelectasis. No edema or pneumothorax.  IMPRESSION:  Improved left basilar aeration with resolution of pleural effusion.    Impression: 66 year old woman status post coronary bypass grafting x2. She is doing extremely well at this point in time. She has minimal discomfort. Her exercise tolerance is already good and is improving. She is anxious to return to work and resume full activities.  I instructed her not to lift anything over 10 pounds for another 3 weeks. She may begin driving, appropriate precautions were discussed. I recommended she wait until after the first of the year before returning to work.  She has  not yet seen Dr. Riley Kill in followup. I advised her to call his office and schedule a followup appointment as he want to do another echocardiogram (or some other test) at some point in the next month or two to assess her LV function.  Plan:  She will followup with Dr. Riley Kill.  I will be happy to see her back any time if I can be of any further assistance with her care.

## 2012-03-27 ENCOUNTER — Encounter: Payer: Self-pay | Admitting: Cardiovascular Disease

## 2012-04-05 ENCOUNTER — Telehealth: Payer: Self-pay | Admitting: *Deleted

## 2012-04-05 NOTE — Telephone Encounter (Signed)
I received a referral from [facility] to fill out for her to begin cardiac rehab.  When she had her followup visit with [place] she had not seen Dr. Riley Kill.  I called her to see if she had done this.  She has not...lost his number. She said she had gotten a letter from Dr. Windell Hummingbird office to make an appt.  I told her that would be appropriate since he had done the initial cath and would be more convenient for her. She said she would and make an appt. Today.  Regarding cardiac rehab she said she did not think she was going to participate.  I told her to discuss this with Dr. Mariah Milling before making a decision about this.  She agreed.  I faxed the referral to [facility] informing them of the same.

## 2012-04-06 ENCOUNTER — Telehealth: Payer: Self-pay | Admitting: *Deleted

## 2012-04-13 ENCOUNTER — Ambulatory Visit (INDEPENDENT_AMBULATORY_CARE_PROVIDER_SITE_OTHER): Payer: Medicare Other | Admitting: Cardiovascular Disease

## 2012-04-13 ENCOUNTER — Encounter: Payer: Self-pay | Admitting: Cardiovascular Disease

## 2012-04-13 VITALS — BP 130/70 | HR 95 | Ht 61.0 in | Wt 145.8 lb

## 2012-04-13 DIAGNOSIS — F17200 Nicotine dependence, unspecified, uncomplicated: Secondary | ICD-10-CM

## 2012-04-13 DIAGNOSIS — I059 Rheumatic mitral valve disease, unspecified: Secondary | ICD-10-CM

## 2012-04-13 DIAGNOSIS — Z951 Presence of aortocoronary bypass graft: Secondary | ICD-10-CM | POA: Insufficient documentation

## 2012-04-13 DIAGNOSIS — R0602 Shortness of breath: Secondary | ICD-10-CM

## 2012-04-13 DIAGNOSIS — I340 Nonrheumatic mitral (valve) insufficiency: Secondary | ICD-10-CM

## 2012-04-13 DIAGNOSIS — Z720 Tobacco use: Secondary | ICD-10-CM

## 2012-04-13 DIAGNOSIS — I255 Ischemic cardiomyopathy: Secondary | ICD-10-CM

## 2012-04-13 DIAGNOSIS — I2589 Other forms of chronic ischemic heart disease: Secondary | ICD-10-CM

## 2012-04-13 MED ORDER — ATORVASTATIN CALCIUM 80 MG PO TABS: 80.0000 mg | ORAL_TABLET | Freq: Every day | ORAL | Status: DC

## 2012-04-13 MED ORDER — LISINOPRIL 10 MG PO TABS: 10.0000 mg | ORAL_TABLET | Freq: Every day | ORAL | Status: DC

## 2012-04-13 MED ORDER — NICOTINE 14 MG/24HR TD PT24: 1.0000 | MEDICATED_PATCH | TRANSDERMAL | Status: DC

## 2012-04-13 MED ORDER — CARVEDILOL 6.25 MG PO TABS: 6.2500 mg | ORAL_TABLET | Freq: Two times a day (BID) | ORAL | Status: DC

## 2012-04-13 NOTE — Assessment & Plan Note (Signed)
Recent bypass surgery in October 2013. Recovering well. Dullness at the left base concerning for pleural effusion. We'll monitor. No significant shortness of breath

## 2012-04-13 NOTE — Assessment & Plan Note (Signed)
We have encouraged her to continue to work on weaning her cigarettes and smoking cessation. She will continue to work on this and does not want any assistance with chantix.  

## 2012-04-13 NOTE — Patient Instructions (Addendum)
You are doing well. Please increase the carvedilol to 6.25 mg twice a day Please increase the lisinopril to 10 mg daily  Stop smoking Call the office if you have worsening shortness of breath  Please call us if you have new issues that need to be addressed before your next appt.  Your physician wants you to follow-up in: 3 months.  You will receive a reminder letter in the mail two months in advance. If you don't receive a letter, please call our office to schedule the follow-up appointment.

## 2012-04-13 NOTE — Assessment & Plan Note (Signed)
Mild-to-moderate on echocardiogram

## 2012-04-13 NOTE — Progress Notes (Signed)
Patient ID: Dana [name], female    DOB: 12-Jul-1945, 66 y.o.   MRN: [id number]  HPI Comments: [name] is a pleasant 66 year old woman with long history of smoking, anxiety attacks , psoriasis ,   who presented to [facility] in October 2013 with systolic CHF,   severe left ventricular dysfunction with an EF of 15% on echocardiogram, cardiac catheterization showing severe proximal LAD and ostial diagonal disease, moderate proximal left circumflex disease,  transferred to [facility], had coronary bypass grafting x2 on 02/23/2012  with  epicardial pacemaker lead on the posterior lateral wall in case she needs a BiV Pacer  in the future.  She did well postoperatively and was discharged home on October 30.  At home, she continues to smoke. She has been walking with no significant chest pain or shortness of breath. No significant cough. Her son smokes. She has tried a nicotine patch but reports the dose was too small. She has not been back to work since discharge. She works as a Conservation officer, nature. Minimal heavy lifting  Echocardiogram 02/17/2012 showed ejection fraction less than 25%, severe global hypokinesis, normal right ventricular systolic function, mild to moderate MR, mild to moderate TR, right ventricular systolic pressure estimated at 50-60 mmHg  Cardiac cath 02/17/2012 showing severe proximal LAD and ostial diagonal disease, bifurcation disease. Estimated at 95%. Moderate proximal left circumflex disease, small to moderate size vessel. Severely depressed ejection fraction estimated at 25%.  EKG today shows normal sinus rhythm with left bundle branch block, rate 95 beats per minute    Outpatient Encounter Prescriptions as of 04/13/2012  Medication Sig Dispense Refill  . acetaminophen (TYLENOL) 500 MG tablet Take 1,000 mg by mouth every 6 (six) hours as needed. For pain      . aspirin EC 325 MG EC tablet Take 1 tablet (325 mg total) by mouth daily.  30 tablet    . atorvastatin (LIPITOR) 80 MG tablet Take 1  tablet (80 mg total) by mouth daily at 6 PM.  90 tablet  3  . carvedilol (COREG) 6.25 MG tablet Take 1 tablet (6.25 mg total) by mouth 2 (two) times daily with a meal.  180 tablet  3  . ferrous sulfate 325 (65 FE) MG tablet Take 1 tablet (325 mg total) by mouth daily with breakfast. For one month then stop.      [name] lisinopril (PRINIVIL,ZESTRIL) 10 MG tablet Take 1 tablet (10 mg total) by mouth daily.  90 tablet  3    Review of Systems  Constitutional: Negative.   HENT: Negative.   Eyes: Negative.   Respiratory: Negative.   Cardiovascular: Negative.   Gastrointestinal: Negative.   Musculoskeletal: Negative.   Skin: Negative.   Neurological: Negative.   Hematological: Negative.   Psychiatric/Behavioral: Negative.   All other systems reviewed and are negative.    BP 130/70  Pulse 95  Ht 5\' 1"  (1.549 m)  Wt 145 lb 12 oz (66.112 kg)  BMI 27.54 kg/m2  Physical Exam  Nursing note and vitals reviewed. Constitutional: She is oriented to person, place, and time. She appears well-developed and well-nourished.  HENT:  Head: Normocephalic.  Nose: Nose normal.  Mouth/Throat: Oropharynx is clear and moist.  Eyes: Conjunctivae normal are normal. Pupils are equal, round, and reactive to light.  Neck: Normal range of motion. Neck supple. No JVD present.  Cardiovascular: Normal rate, regular rhythm, S1 normal, S2 normal, normal heart sounds and intact distal pulses.  Exam reveals no gallop and no friction rub.  No murmur heard. Pulmonary/Chest: Effort normal. No respiratory distress. She has decreased breath sounds. She has no wheezes. She has no rales. She exhibits no tenderness.       Dull at the left base  Abdominal: Soft. Bowel sounds are normal. She exhibits no distension. There is no tenderness.  Musculoskeletal: Normal range of motion. She exhibits no edema and no tenderness.  Lymphadenopathy:    She has no cervical adenopathy.  Neurological: She is alert and oriented to person,  place, and time. Coordination normal.  Skin: Skin is warm and dry. Rash noted. No erythema.       Psoriasis lesions on her back and legs  Psychiatric: She has a normal mood and affect. Her behavior is normal. Judgment and thought content normal.         Assessment and Plan

## 2012-04-13 NOTE — Assessment & Plan Note (Signed)
Increase the coreg to 6.25 mg po BID and increase lisinopril to 10 mg daily.

## 2012-04-18 ENCOUNTER — Telehealth: Payer: Self-pay | Admitting: *Deleted

## 2012-04-18 NOTE — Telephone Encounter (Signed)
I have reviewed pt's chart, she is now 7 weeks post CABG but her EF remains low 25% and she may have pleaural effusions. She is not a candidate for EGD/colonoscopy at this time. I would like to wait till she sees Dr Mariah Milling in 3 months, obtain her CBC,Iron studies and B12 and see her at that point in the office.. She had a heme negative anemia prior to CABG. She is on Iron supplements. I will send a note to Dr Mariah Milling

## 2012-04-18 NOTE — Telephone Encounter (Signed)
Message copied by Richardson Chiquito on Wed Apr 18, 2012 12:59 PM ------      Message from: Richardson Chiquito      Created: Thu Feb 23, 2012  3:23 PM                   ----- Message -----         From: Dianah Field, PA         Sent: 02/23/2012   2:07 PM           To: Hart Carwin, MD, Richardson Chiquito, CMA            Hi Dottie,       Dr Juanda Chance wants pt to be notified with a follow up letter.  I copied Dora's note below      "I have reviewed the above note, examined the patient and agree with plan of treatment. Heme negative chronic iron deficiency anemia, stable hemodynamically, she  Is on OR schedule for tomorrow for CABG. I have talked to her and her son and discussed an outpatient colonoscopy, possibly EGD after she recovers from the surgery.She lives in [place] and is planning to get a PCP there.We will give her a card with GI office # and send her a reminder in about 8 weeks."            Can you take care of this and send the reminder?        Thanks, Maralyn Sago

## 2012-04-18 NOTE — Telephone Encounter (Signed)
Tim,  We were asked to see Mrs Dana [name]  Before her CABG for heme negative anemia. She had CABG the next day so we suggested that she follows up with GI as an outpatient in 6-8 weeks,so we can determine if she is a candidate for GI evaluation. I saw your office note from last week and feel that she may be too high risk to consider GI eval. Could I, please, ask you to check her CBC,B12 and Iron, TIBC and ferritin next time you see her ( in 3 months?), just for me  to get an idea if we should see her in the office. THANK YOU VERY MUCH, hope you are doing well!! Lina Sar

## 2012-04-18 NOTE — Telephone Encounter (Signed)
Dr Juanda Chance- Does patient need an office visit to discuss colonoscopy and possible endoscopy or do I need to schedule endo/colon directly? Apparently, 02/22/12, patient had ECG showing "Left ventricle: The cavity size was moderately dilated.Systolic function was severely reduced. The estimated ejection fraction was in the range of 15% to 20%.". She also had recent CABG.

## 2012-04-23 ENCOUNTER — Encounter: Payer: Self-pay | Admitting: Cardiovascular Disease

## 2012-04-25 NOTE — Telephone Encounter (Signed)
Dana [name],  Can you place the labs orders as details by Dr. Juanda Chance for three months from now? thx Jorja Loa

## 2012-04-26 ENCOUNTER — Other Ambulatory Visit: Payer: Self-pay

## 2012-04-26 DIAGNOSIS — R5383 Other fatigue: Secondary | ICD-10-CM

## 2012-04-26 DIAGNOSIS — E538 Deficiency of other specified B group vitamins: Secondary | ICD-10-CM

## 2012-04-26 DIAGNOSIS — I2510 Atherosclerotic heart disease of native coronary artery without angina pectoris: Secondary | ICD-10-CM

## 2012-04-26 DIAGNOSIS — E611 Iron deficiency: Secondary | ICD-10-CM

## 2012-04-26 NOTE — Telephone Encounter (Signed)
done

## 2012-07-12 ENCOUNTER — Ambulatory Visit: Payer: Medicare Other | Admitting: Cardiovascular Disease

## 2012-07-12 ENCOUNTER — Other Ambulatory Visit: Payer: Medicare Other

## 2012-07-18 ENCOUNTER — Encounter: Payer: Self-pay | Admitting: *Deleted

## 2012-07-27 ENCOUNTER — Telehealth: Payer: Self-pay | Admitting: *Deleted

## 2012-07-27 NOTE — Telephone Encounter (Signed)
Message copied by Richardson Chiquito on Fri Jul 27, 2012  2:52 PM ------      Message from: Hart Carwin      Created: Fri Jul 27, 2012 12:37 PM       The best thing would be if she comes to the office so that I can check her stool hemoccult and check her labs. Thanx       ----- Message -----         From: Richardson Chiquito, CMA         Sent: 07/27/2012   8:57 AM           To: Hart Carwin, MD            Dr Brodie=-      Please see telephone note dated 04/18/12. Patient was supposed to see Dr Mariah Milling 07/12/12 but no showed the appointment and a lab appointment and has not rescheduled. Will patient need to reschedule with Dr Mariah Milling before we can evaluate or do you just want me to call and ask her to come to our lab?      ----- Message -----         From: Richardson Chiquito, CMA         Sent: 07/27/2012           To: Richardson Chiquito, CMA                        ----- Message -----         From: Richardson Chiquito, CMA         Sent: 07/13/2012           To: Richardson Chiquito, CMA                        ----- Message -----         From: Richardson Chiquito, CMA         Sent: 06/04/2012           To: Richardson Chiquito, CMA            See note dated 04/18/12 regarding gi eval.... See if we need to schedule ov...             ------

## 2012-07-27 NOTE — Telephone Encounter (Signed)
Left message for patient to call back  

## 2012-07-31 NOTE — Telephone Encounter (Signed)
I have left another message for patient to call back. 

## 2012-08-02 NOTE — Telephone Encounter (Signed)
I have left a 3rd message for patient to call our office back. If there is no response to this message, I will send a letter to patient's home address.

## 2012-08-06 NOTE — Telephone Encounter (Signed)
Patient has not called back. Letter has been sent to the home address that we have on file.

## 2013-07-19 NOTE — Telephone Encounter (Signed)
erro  neous encounter

## 2013-09-21 IMAGING — CR DG CHEST 2V
2 series · 2 of 2 positions shown · non-contrast
Comparison: None.

CLINICAL DATA: CHF, shortness of breath

CHEST - 2 VIEW

[w chest pa]
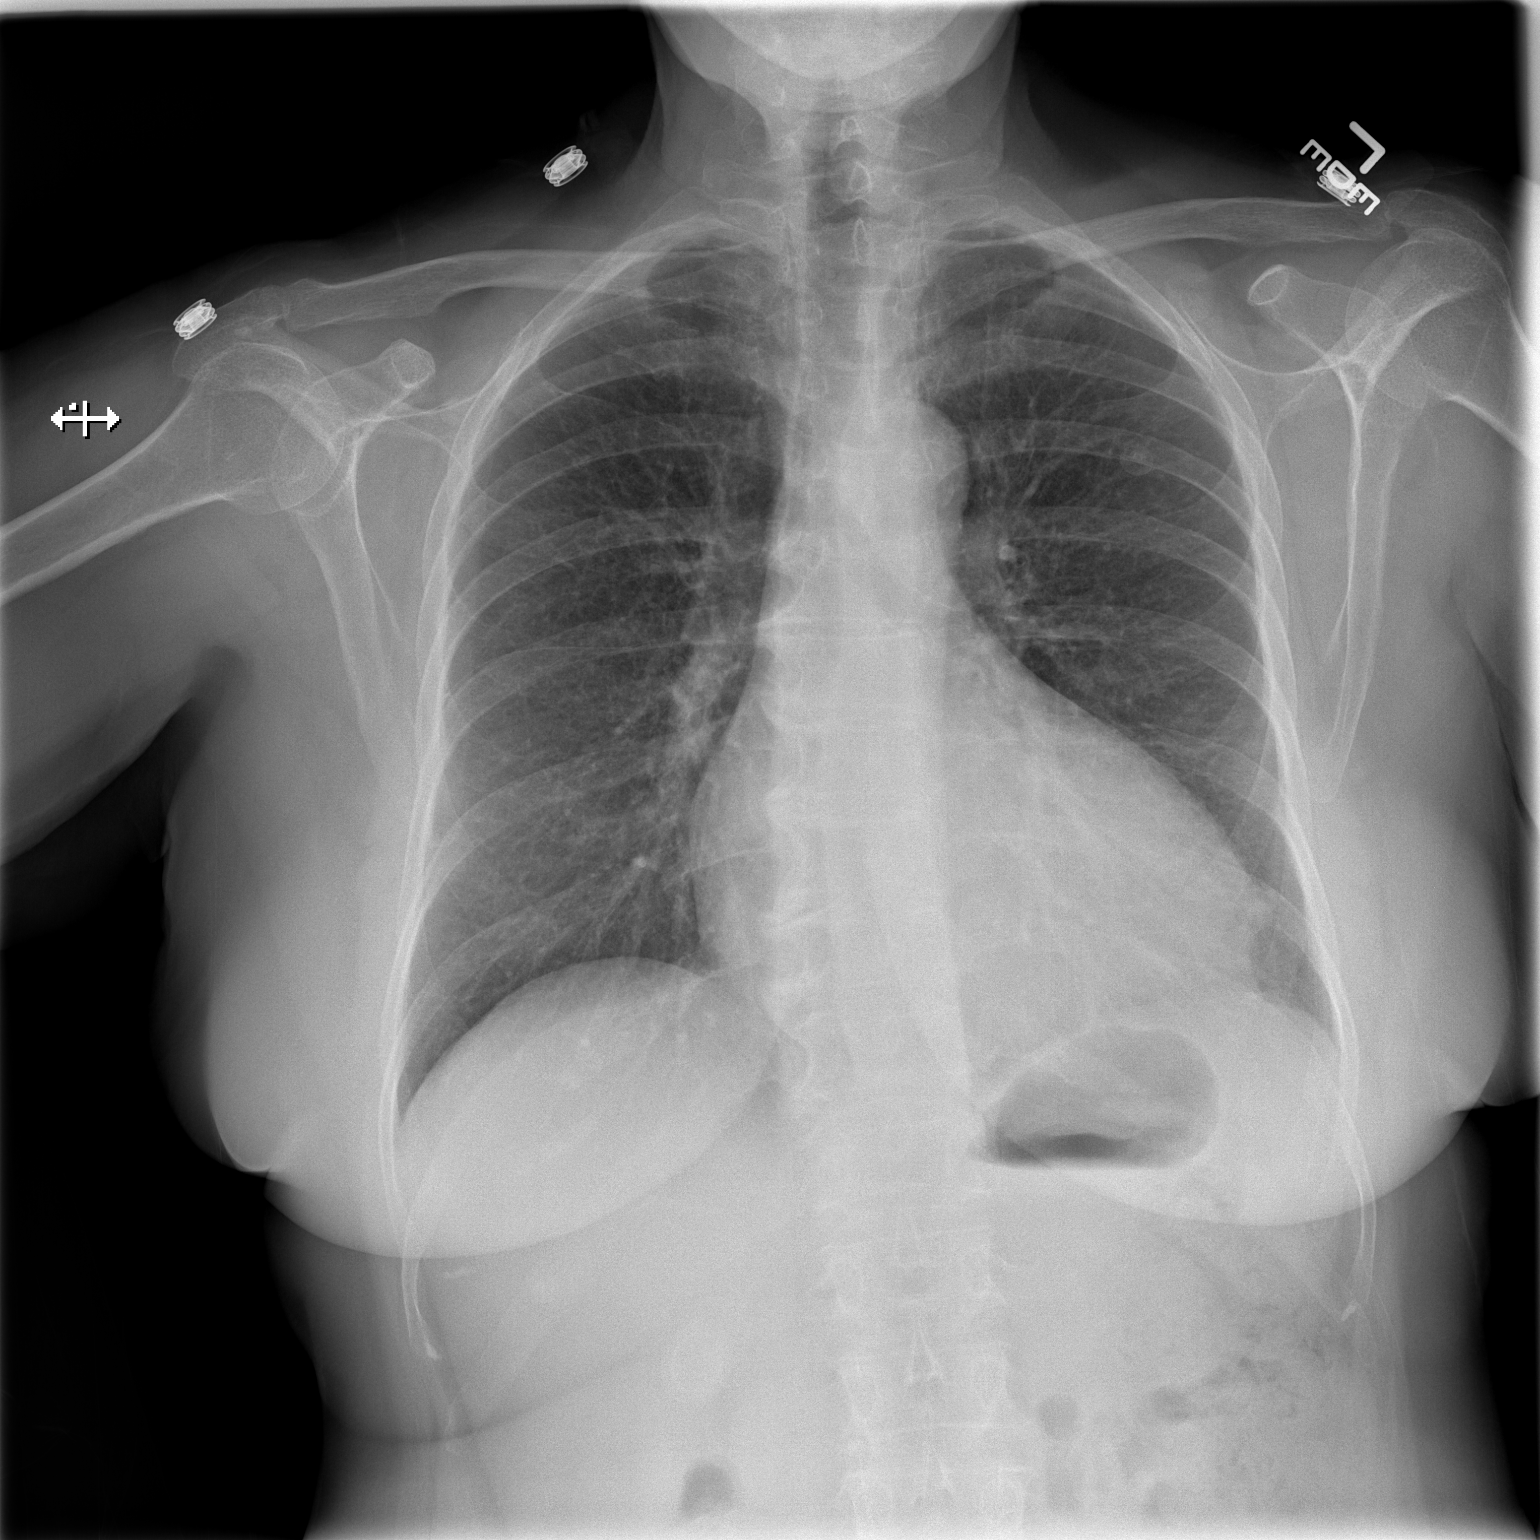

[w chest lat]
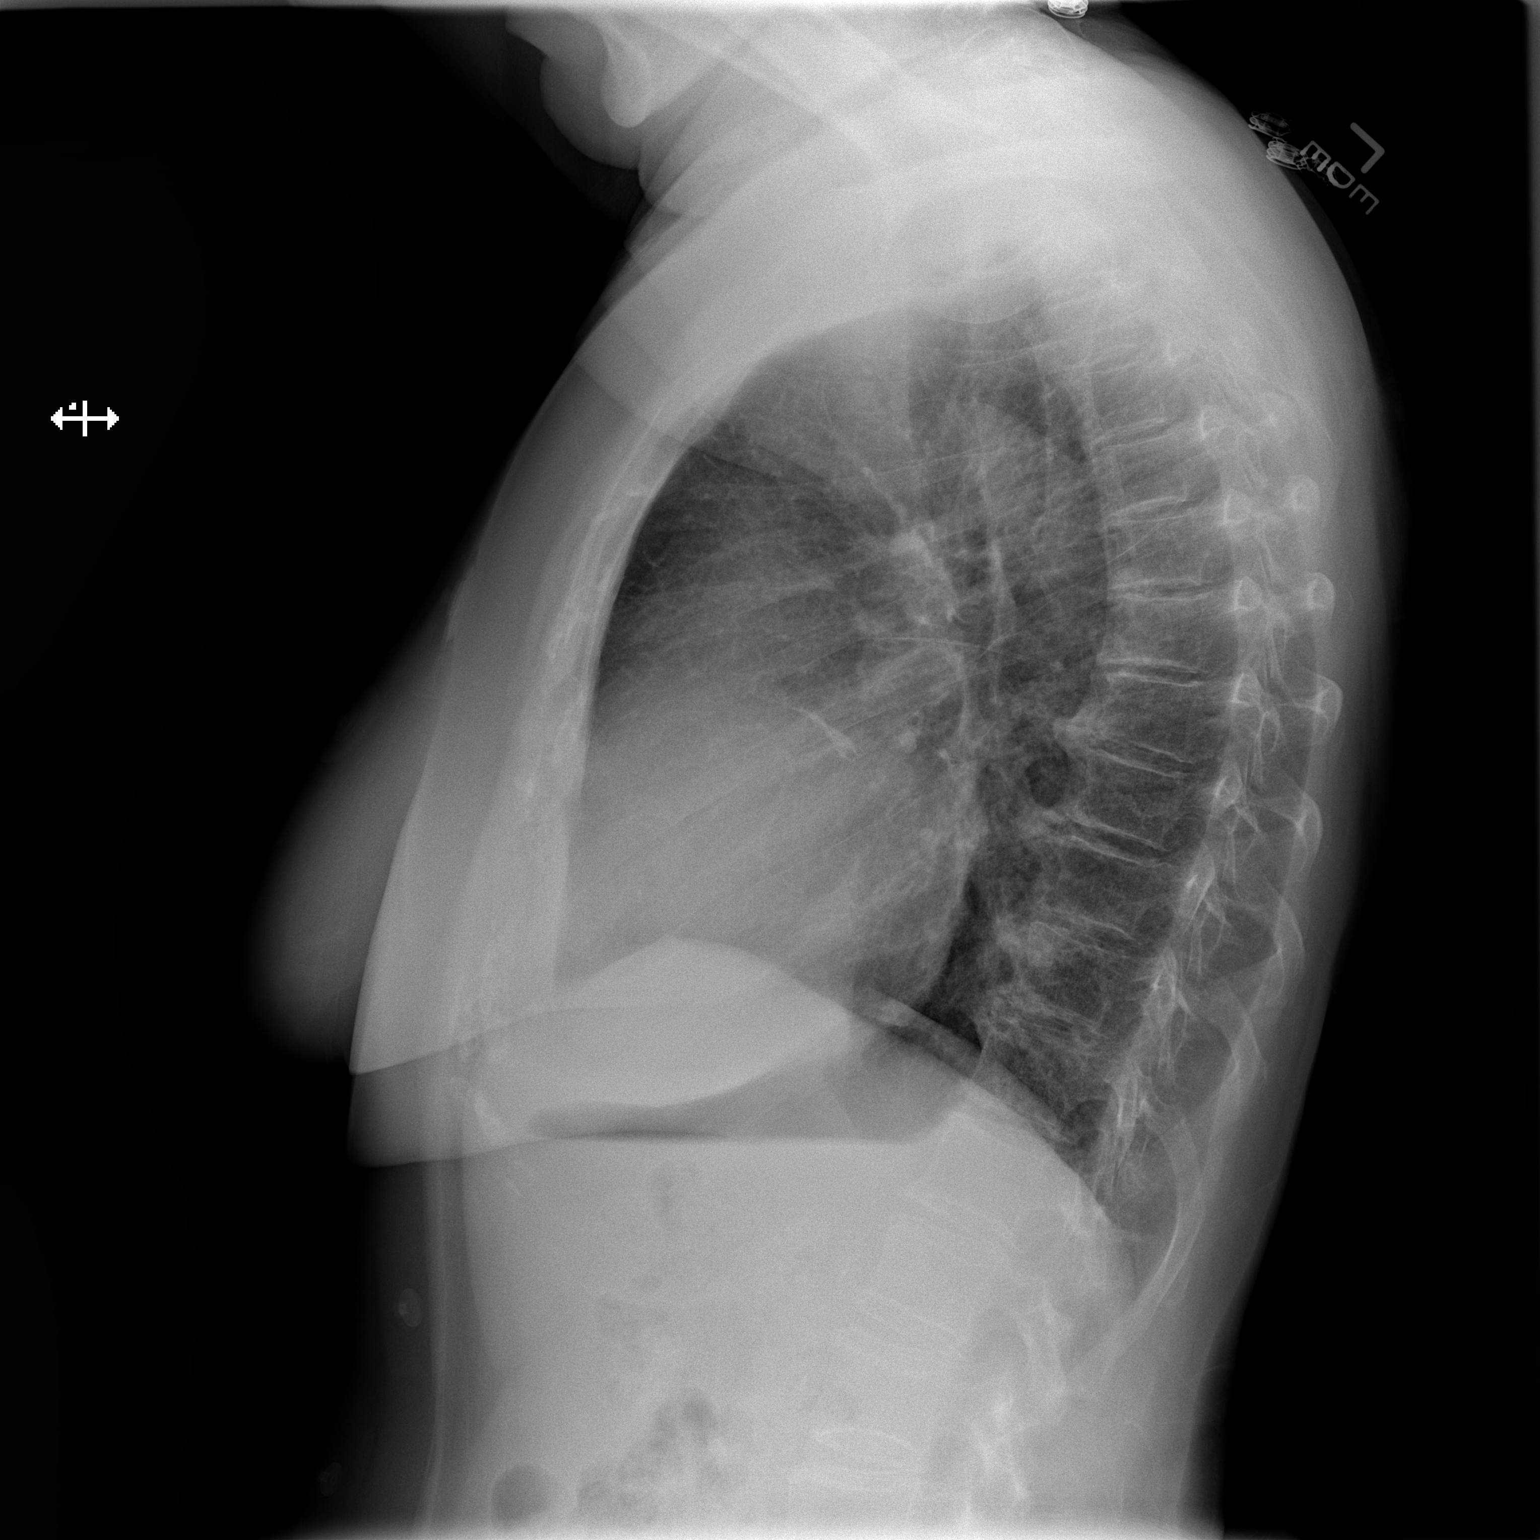

[2 of 2 positions shown; findings below may reference images not displayed]

FINDINGS: Lungs are clear. No pleural effusion or pneumothorax.

The heart is top normal in size.

Degenerative changes of the visualized thoracolumbar spine.
IMPRESSION: No evidence of acute cardiopulmonary disease.

## 2013-09-24 IMAGING — CR DG CHEST 1V PORT
1 series · 1 of 1 positions shown · non-contrast
Comparison: Portable exam 4434 hours compared to 02/20/2012

CLINICAL DATA: Post open heart surgery

PORTABLE CHEST - 1 VIEW

[view not recorded]
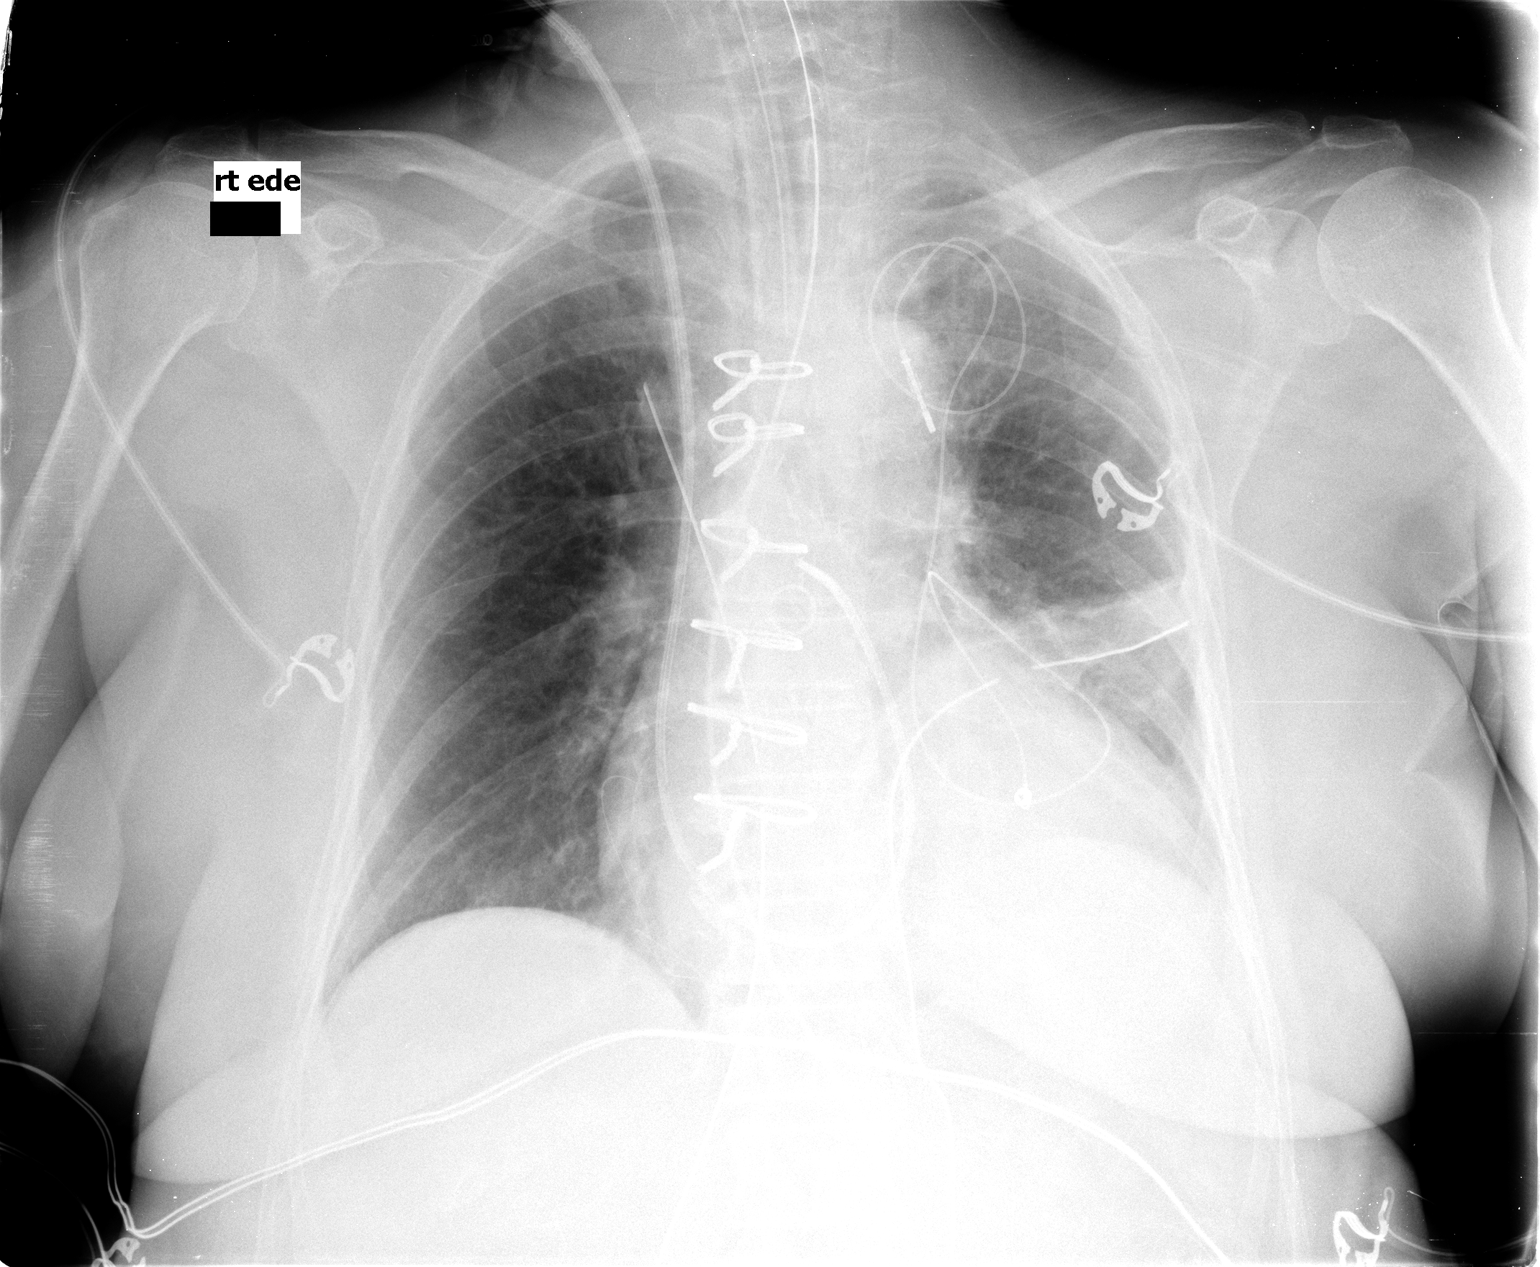

[1 of 1 positions shown; findings below may reference images not displayed]

FINDINGS: Tip of endotracheal tube 3.4 cm above carina.
Nasogastric tube extends into stomach.
Right jugular Swan-Ganz catheter, tip projecting over proximal
right pulmonary artery.
Mediastinal drain and left thoracostomy tube present.
Epicardial pacing leads noted.
Enlargement of cardiac silhouette post interval CABG.
Mild left basilar atelectasis.
Question minimal perihilar edema on left.
No definite pleural effusion or pneumothorax.
Bones demineralized.
IMPRESSION: Postoperative changes as above.

## 2013-09-25 IMAGING — CR DG CHEST 1V PORT
1 series · 1 of 1 positions shown · non-contrast
Comparison: 02/23/2012

CLINICAL DATA: Coronary artery disease

PORTABLE CHEST - 1 VIEW

[AP]
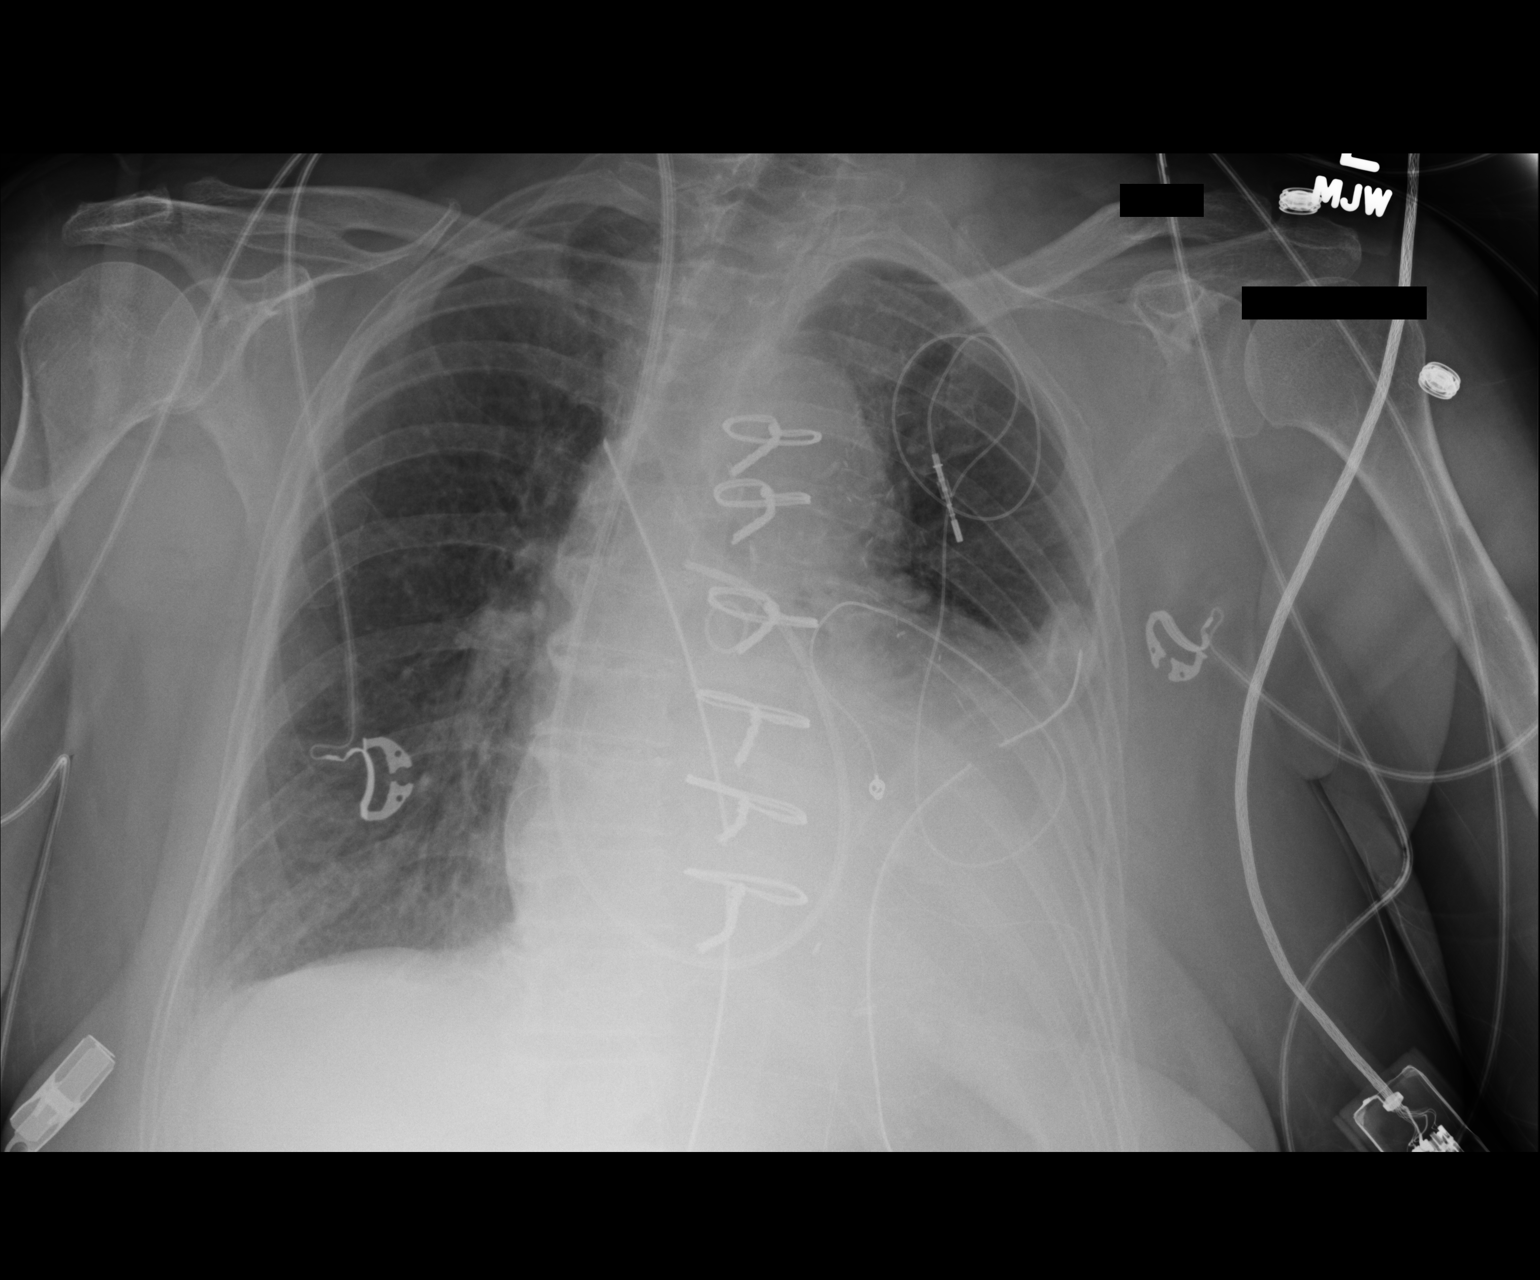

[1 of 1 positions shown; findings below may reference images not displayed]

FINDINGS: Endotracheal and NG tubes removed.  Tubular structures
otherwise stable.  Bibasilar haziness increased likely a
combination of pleural fluid and volume loss.  Pulmonary
vascularity unremarkable.  No pneumothorax.
IMPRESSION: Extubated.

Increasing pulmonary haziness at the lung bases.

No pneumothorax.

## 2013-09-26 IMAGING — CR DG CHEST 1V PORT
1 series · 1 of 1 positions shown · non-contrast
Comparison: 02/24/2012

CLINICAL DATA: Postop cardiac surgery.

PORTABLE CHEST - 1 VIEW

[AP]
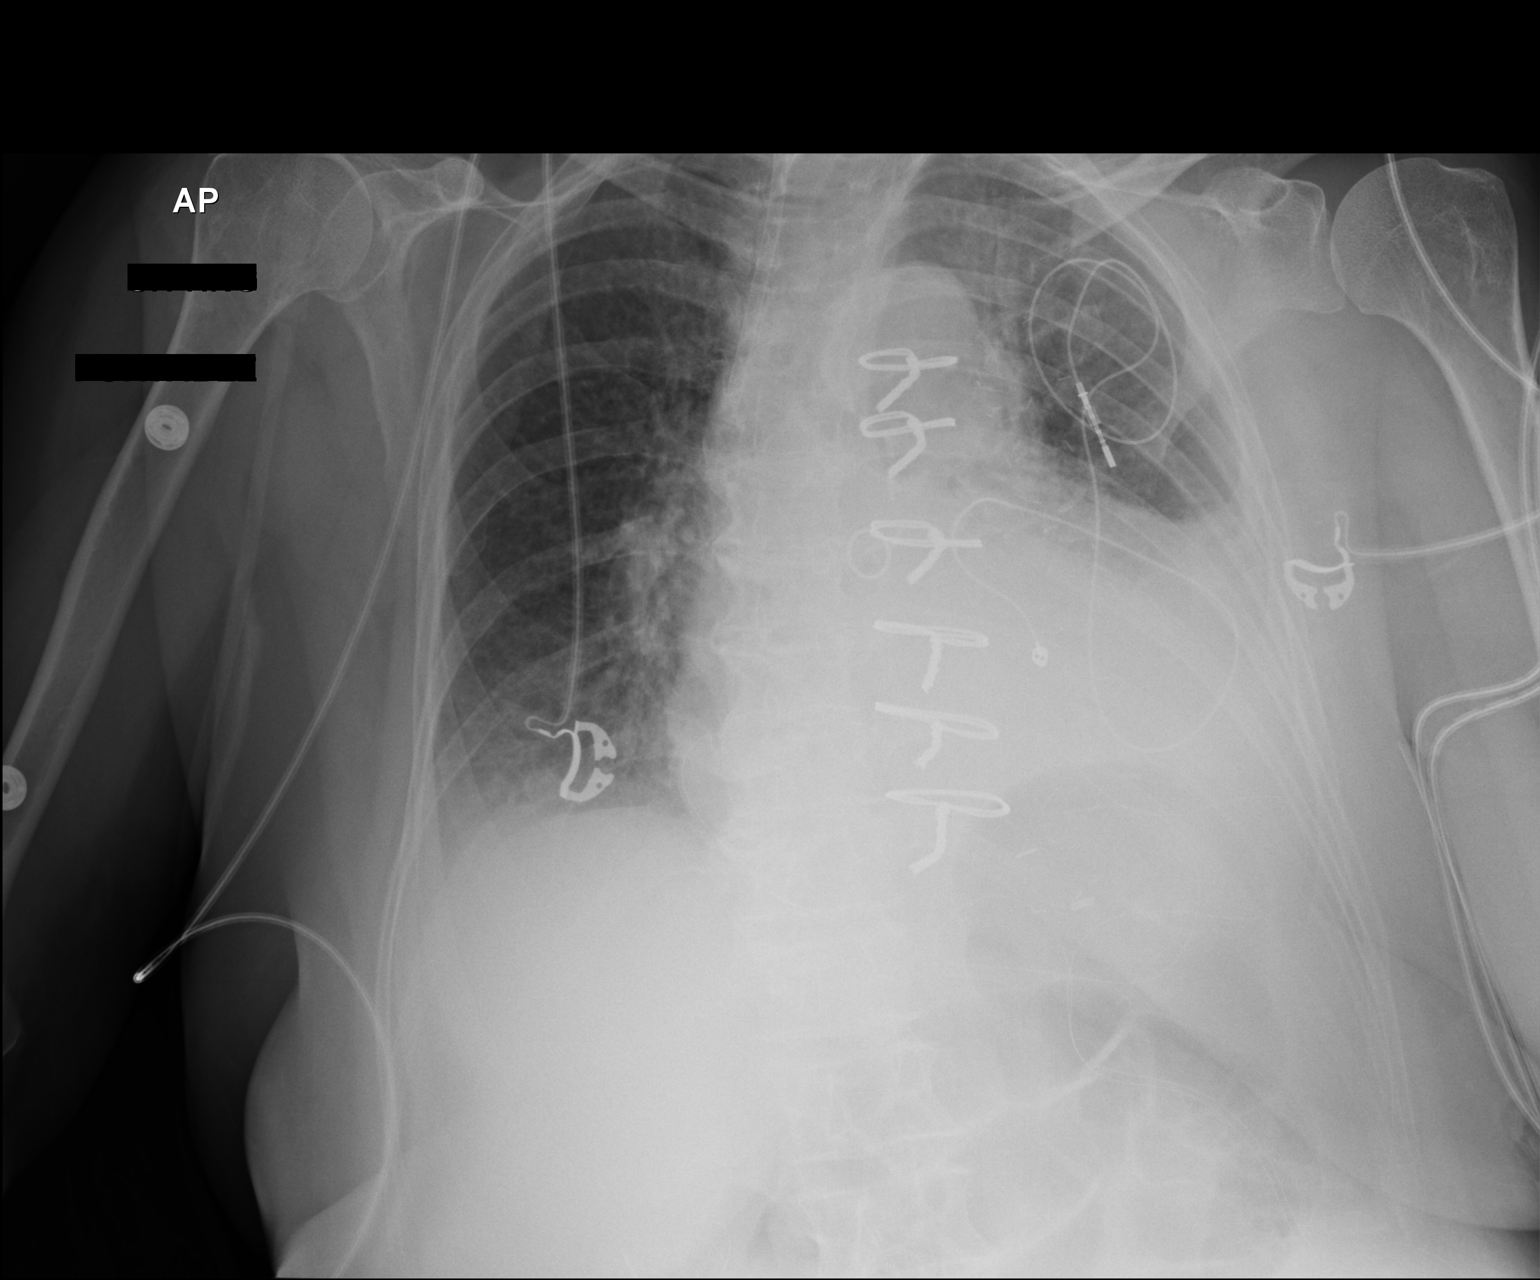

[1 of 1 positions shown; findings below may reference images not displayed]

FINDINGS: Stable postoperative changes in the mediastinum.  The
left chest tube and mediastinal drains have been removed.  The Swan-
Ganz catheter has been removed with sheath left in place, tip in
the mid SVC region.  Cardiac enlargement with mild pulmonary
vascular congestion.  Small bilateral pleural effusions with
infiltration or atelectasis in the lung bases.  No pneumothorax.
IMPRESSION: Cardiac enlargement with mild pulmonary vascular congestion.  Small
bilateral pleural effusions with bilateral basilar infiltration or
atelectasis.

## 2014-08-19 NOTE — H&P (Signed)
PATIENT NAME:  Dana [name], Dana [name] MR#:  [id number] DATE OF BIRTH:  Oct 18, 1945  DATE OF ADMISSION:  02/16/2012  REFERRING PHYSICIAN: Dr. Enedina Finner   CHIEF COMPLAINT: Shortness of breath.   HISTORY OF PRESENT ILLNESS: The patient is a pleasant 69 year old Caucasian female with history of chronic psoriasis on no medications and no active medical conditions that she follows with a physician for who presents with shortness of breath and orthopnea for four days. The patient stated that she has chronic allergies since moving to [place] from the [place]. She has probably once or twice a year bouts of shortness of breath which she attributes to allergies. However, this time around the symptoms which usually last a short time did not go away. The patient also noticed she had labored breathing and could not lay flat and had to stay up at night and had difficulty sleeping. She did not have any chest pain in the last several days nor is she having any chest pain today. There is no cough or fever. She has no history of myocardial infarction or congestive heart failure. She went to the Urgent [facility] Center in [facility] and was noted to be in heart failure and was recommended to come to the hospital here where she was found with elevated BNP of 25,000, mildly elevated troponin of 0.07, and x-ray of the chest showing congestive heart failure. The hospitalist service was contacted for further evaluation and management.   PAST MEDICAL HISTORY: Chronic psoriasis.   MEDICATIONS:  None.  ALLERGIES: Denies.   PAST SURGICAL HISTORY: Denies.   SOCIAL HISTORY: Positive for tobacco, a pack a week. No alcohol or drug use. 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   FAMILY HISTORY: Dad with coronary artery disease and bypass in his 67s and mom with Alzheimer's.   REVIEW OF SYSTEMS: CONSTITUTIONAL: Denies weight gain, weight loss, or fevers. EYES: No blurry vision or double vision. ENT: No tinnitus or snoring. Chronic hearing  loss.  RESPIRATORY: No cough. Occasional wheezing. Positive for shortness of breath and dyspnea on exertion. No painful respirations. CARDIOVASCULAR: No chest pain. Positive for orthopnea and swelling in the legs. No history of arrhythmia or congestive heart failure. GI: No nausea, vomiting, diarrhea, abdominal pain, hematemesis, or loose stools. GENITOURINARY: Denies dysuria, blood in urine, or frequency. ENDOCRINE: Denies polyuria or nocturia. HEME/LYMPH: No anemia or easy bruising. SKIN: Has chronic psoriasis. MUSCULOSKELETAL: No muscle or joint pains. NEURO: No numbness or weakness. PSYCHIATRIC: No depression. Positive for anxiety.   PHYSICAL EXAMINATION:  VITAL SIGNS: Temperature on arrival 97.7, pulse 105, respiratory rate 26, blood pressure 159/99, oxygen sat 97% on room air.   GENERAL: The patient is an obese Caucasian female sitting in bed in some mild respiratory distress.   HEENT: Normocephalic, atraumatic. Pupils are equal and reactive. Anicteric sclerae. Moist mucous membranes.   NECK: Supple. Positive for JVD. No cervical lymphadenopathy.   CARDIOVASCULAR: S1, S2, tachycardic. No murmurs, rubs, or gallops.   LUNGS: Positive for crackles more on the right than the left to about a third of the lungs.   ABDOMEN: Soft, nontender, nondistended. Positive bowel sounds in all quadrants.   EXTREMITIES: 1+ lower extremity edema.  SKIN: Psoriatic rash/plaques scattered more on the trunk and left lower extremity without drainage.   NEUROLOGICAL: Cranial nerves II through XII grossly intact. Strength is 5/5 in all extremities.   PSYCH: Awake, alert, oriented times three, pleasant and cooperative.   LABORATORY, DIAGNOSTIC, AND RADIOLOGICAL DATA: BNP is 25,574, glucose 127,  creatinine 0.84, chloride 111. LFTs: Albumin 1.1 otherwise within normal limits. CK total 153. CK-MB is 4.9, troponin 0.07. TSH 4.73. WBC 9.3, hemoglobin 11, hematocrit 34.6, platelets are 408. MCV 79.  Urinalysis: 3+  leukocyte esterase, no nitrites, 19 WBCs. EKG showing some Q waves in V1, V3. Sinus tach with PVCs and left bundle branch block. Chest PA and lateral x-ray in [place] done earlier today showing findings consistent with congestive heart failure with mild pulmonary interstitial edema.   ASSESSMENT AND PLAN: We have a 69 year old Caucasian female with chronic psoriasis on no medications, no active medical conditions, who does not generally follow with a doctor who presents with signs and symptoms of acute congestive heart failure, unknown type. At this point, we will admit the patient to telemetry. The patient has elevated BNP and history suggesting congestive heart failure with positive x-ray findings. I will start on Lasix IV b.i.d. and low dose ACE and beta blocker, aspirin, nitroglycerin, and morphine p.r.n. and obtain an echocardiogram to evaluate for type of congestive heart failure. We will also obtain ins and outs and daily weights. We will cycle the troponins as she does have a positive, mildly elevated troponin. However, it could be demand ischemia versus less likely myocardial infarction. The patient has had no chest pains through the last several days and has no chest pain now. We will, again, cycle the troponins and evaluate echo for regional wall motion abnormalities. I discussed the case with Dr. Mariah Milling. The patient has no history of myocardial infarction; however, there is a left bundle. At this point we can watch her with management as above and trend the troponins. Cardiology will evaluate her.  In regard to tobacco abuse we will start her on a patch and she was counseled for three minutes.   CODE STATUS: Patient is FULL CODE.  TOTAL TIME SPENT:  55 minutes.   ____________________________ Krystal Eaton, MD sa:bjt D: 02/16/2012 12:04:39 ET T: 02/16/2012 13:09:29 ET JOB#: [id number]  cc: Krystal Eaton, MD, <Dictator> Krystal Eaton MD ELECTRONICALLY SIGNED 03/02/2012 12:44

## 2014-08-19 NOTE — Discharge Summary (Signed)
PATIENT NAME:  Dana [name], Dana [name] MR#:  [id number] DATE OF BIRTH:  [birth year]  DATE OF ADMISSION:  02/16/2012 DATE OF DISCHARGE:  02/17/2012  DISPOSITION: Patient will be transferred emergently to [facility] today.   ACCEPTING PHYSICIAN: Dr. Bonnee Quin from cardiology  CONSULTANT: Dr. Julien Nordmann from cardiology.   CHIEF COMPLAINT: Shortness of breath.   DISCHARGE DIAGNOSES:  1. Acute systolic congestive heart failure with ejection fraction of less than 25% with severe global hypokinesis, dilated and ischemic cardiomyopathy.  2. Severe multivessel coronary artery disease status post catheterization on 02/17/2012 showing severe proximal left anterior descending and ostial diagonal disease estimated at 95%, moderate proximal left circumflex disease, small to moderate size vessel and severely depressed ejection fraction, ejection fraction of about 25%, global hypokinesis.  3. Tobacco abuse.  4. Chronic psoriasis.  5. Elevated troponin.   CURRENT MEDICATIONS:  1. Lisinopril 5 mg daily.  2. Metoprolol 25 mg 2 times a day.  3. Nicotine patch. 4. Nitroglycerin ointment 2% q.6 hours 1 inch.   5. Aspirin 81 mg daily.  6. Heparin 5000 units q.12 hours.  7. Lasix 20 mg IV b.i.d.  8. Morphine p.r.n.   LABORATORY, DIAGNOSTIC AND RADIOLOGICAL DATA: Initial BNP 25,574, glucose 127, potassium 3.7, sodium 145, creatinine 0.84. Initial troponin 0.07 then 0.1, 0.11. Initial CK-MB 4.9 then 5.5 then 4.5. TSH 4.73. WBC on arrival 9.3, hemoglobin 11. Urinalysis no nitrites, 3+ leukocyte esterase, 19 WBCs, no bacteria. Free thyroxine and total T4 and free T3 both within normal limits. Initial x-ray of the chest, PA and lateral, showing findings consistent with congestive heart failure and mild pulmonary interstitial edema.   HISTORY OF PRESENT ILLNESS/HOSPITAL COURSE: For full details of the history and physical, please see the dictation on 02/16/2012 by Dr. Jacques Navy, but briefly this is a pleasant  69 year old Caucasian female who presented with several days of shortness of breath, orthopnea, swelling in the legs. Patient has no primary care physician. She has no history of myocardial infarction. She had gone to urgent care center in [facility] and was noted to be in heart failure and was referred here. She was found with elevation of BNP and signs and symptoms of congestive heart failure with mildly elevated troponin. She did not have any chest pain on admission or in the preceding days prior to admission. She was started on Lasix, beta blocker, ACE inhibitor and an aspirin. Initial EKG was also felt to be in left bundle branch block. Cardiology was consulted and patient was getting diuresed and echocardiogram was obtained the following day showing severely depressed ejection fraction. Henceforth she was taken to catheterization lab on the same day which showed severe coronary artery disease as dictated above. Dr. Mariah Milling did discuss the case with physicians at [facility] who accepted the patient for transfer. Patient will be transferred to catheterization lab at [facility] this afternoon as the lesion is in a complicated bifurcation area. There is also a possibility she might need a CABG at this point. Currently she is chest pain free and is awaiting transportation to [facility].   TOTAL TIME SPENT: 40 minutes.   CODE STATUS: Patient is FULL CODE.   ____________________________ Krystal Eaton, MD sa:cms D: 02/17/2012 15:46:10 ET T: 02/17/2012 16:06:03 ET JOB#: [id number]  cc: Krystal Eaton, MD, <Dictator> Krystal Eaton MD ELECTRONICALLY SIGNED 03/02/2012 12:44

## 2014-08-19 NOTE — Consult Note (Signed)
General Aspect 69 year old Caucasian female with history of chronic psoriasis for several years, long smoking hx,  on no medications  presents with shortness of breath and orthopnea for four days. Cardiology was consulted for acute systolic CHF.  The patient stated that she has chronic allergies since moving to [place] from the [place].  this week, her symptoms did not go away.  she had labored breathing and could not lay flat and had to stay up at night and had difficulty sleeping. She did not have any chest pain. There is no cough or fever. She has no history of myocardial infarction or congestive heart failure.   She went to the Urgent [place] in [facility] and was  recommended to come to the hospital here where she was found with elevated BNP of 25,000, and x-ray of the chest showing congestive heart failure.  ECHO shows severe LV dysfunction, EF 25%, mild to moderately dilated LV, Mild to moderate MR and TR, moderately elevated RVSP. Grossly normal RV function   PAST MEDICAL HISTORY:  Chronic psoriasis.   MEDICATIONS:   None.  ALLERGIES:  Denies.   PAST SURGICAL HISTORY:  Denies.   SOCIAL HISTORY:  Positive for tobacco, a pack a week. No alcohol or drug use. Works in a  Market researcher as Primary school teacher.   FAMILY HISTORY: Dad with coronary artery disease and bypass in his 3s and mom with Alzheimer???s.   Physical Exam:   GEN well developed, well nourished, no acute distress    HEENT moist oral mucosa    NECK supple    RESP normal resp effort  scant rhonchi at the bases    CARD Regular rate and rhythm  No murmur    ABD denies tenderness  soft    LYMPH negative neck    EXTR negative edema    SKIN normal to palpation    NEURO motor/sensory function intact    PSYCH alert, A+O to time, place, person, good insight   Review of Systems:   Subjective/Chief Complaint SOB    General: No Complaints    Skin: No Complaints    ENT: No Complaints    Eyes: No  Complaints    Neck: No Complaints    Respiratory: Short of breath    Cardiovascular: No Complaints    Gastrointestinal: No Complaints    Genitourinary: No Complaints    Vascular: No Complaints    Musculoskeletal: No Complaints    Neurologic: No Complaints    Hematologic: No Complaints    Endocrine: No Complaints    Psychiatric: No Complaints    Review of Systems: All other systems were reviewed and found to be negative    Medications/Allergies Reviewed Medications/Allergies reviewed   Lab Results:  Routine Chem:  18-Oct-13 03:14    Result Comment troponin - RESULTS VERIFIED BY REPEAT TESTING.  - PREVIOUS CALL: 02/16/12@1056 ..TPL  Result(s) reported on 17 Feb 2012 at 04:28AM.   Glucose, Serum 98   BUN  20   Creatinine (comp) 1.04   Sodium, Serum 143   Potassium, Serum 3.5   Chloride, Serum 106   CO2, Serum 27   Calcium (Total), Serum  8.4   Anion Gap 10   Osmolality (calc) 288   eGFR (African American) >60   eGFR (Non-African American)  55 (eGFR values <59mL/min/1.73 m2 may be an indication of chronic kidney disease (CKD). Calculated eGFR is useful in patients with stable renal function. The eGFR calculation will not be reliable in acutely ill  patients when serum creatinine is changing rapidly. It is not useful in  patients on dialysis. The eGFR calculation may not be applicable to patients at the low and high extremes of body sizes, pregnant women, and vegetarians.)   Magnesium, Serum  1.4 (1.8-2.4 THERAPEUTIC RANGE: 4-7 mg/dL TOXIC: > 10 mg/dL  -----------------------)   Hemoglobin A1c ([facility]) 5.4 (The American Diabetes Association recommends that a primary goal of therapy should be <7% and that physicians should reevaluate the treatment regimen in patients with HbA1c values consistently >8%.)   Cholesterol, Serum 123   Triglycerides, Serum 99   HDL (INHOUSE)  30   VLDL Cholesterol Calculated 20   LDL Cholesterol Calculated 73 (Result(s) reported on 17 Feb 2012 at 04:04AM.)  Cardiac:  18-Oct-13 03:14    Troponin I  0.11 (0.00-0.05 0.05 ng/mL or less: NEGATIVE  Repeat testing in 3-6 hrs  if clinically indicated. >0.05 ng/mL: POTENTIAL  MYOCARDIAL INJURY. Repeat  testing in 3-6 hrs if  clinically indicated. NOTE: An increase or decrease  of 30% or more on serial  testing suggests a  clinically important change)   CK, Total 168   CPK-MB, Serum  4.5 (Result(s) reported on 17 Feb 2012 at 04:15AM.)  Routine Hem:  18-Oct-13 03:14    WBC (CBC) 8.6   RBC (CBC) 3.90   Hemoglobin (CBC)  9.9   Hematocrit (CBC)  31.0   Platelet Count (CBC) 339   MCV  79   MCH  25.4   MCHC 32.0   RDW  17.8   Neutrophil % 71.2   Lymphocyte % 19.3   Monocyte % 7.4   Eosinophil % 1.2   Basophil % 0.9   Neutrophil # 6.1   Lymphocyte # 1.7   Monocyte # 0.6   Eosinophil # 0.1   Basophil # 0.1 (Result(s) reported on 17 Feb 2012 at 03:44AM.)   EKG:   Interpretation EKG shows sinus tachycardia rate 106 bpm, LBBB    No Known Allergies:   Vital Signs/Nurse's Notes: **Vital Signs.:   18-Oct-13 09:16   Vital Signs Type Routine   Temperature Temperature (F) 98.9   Celsius 37.1   Temperature Source oral   Pulse Pulse 80   Respirations Respirations 18   Systolic BP Systolic BP 611   Diastolic BP (mmHg) Diastolic BP (mmHg) 76   Mean BP 90   Pulse Ox % Pulse Ox % 95   Pulse Ox Activity Level  At rest   Oxygen Delivery Room Air/ 21 %     Impression 69 year old Caucasian female with history of chronic psoriasis for several years, long smoking hx,  on no medications  presents with shortness of breath and orthopnea for four days. Cardiology was consulted for acute systolic CHF.  1) SOB/ Acute systolic CHF Severely depressed EF Currently on lasix, b-blocker, ace --Will plan for cardiac cath today given long smoking hx, LBBB, new severe systolic dysfunction. Advance ACE and coreg as tolerated. 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

## 2018-07-25 ENCOUNTER — Emergency Department: Payer: Medicare Other

## 2018-07-25 ENCOUNTER — Inpatient Hospital Stay
Admission: EM | Admit: 2018-07-25 | Discharge: 2018-07-30 | DRG: 286 | Disposition: A | Discharge status: Home / Self Care | Payer: Medicare Other | Attending: Internal Medicine | Admitting: Internal Medicine

## 2018-07-25 ENCOUNTER — Other Ambulatory Visit: Payer: Self-pay

## 2018-07-25 ENCOUNTER — Encounter: Payer: Self-pay | Admitting: *Deleted

## 2018-07-25 DIAGNOSIS — I700 Atherosclerosis of aorta: Secondary | ICD-10-CM | POA: Diagnosis present

## 2018-07-25 DIAGNOSIS — Z91128 Patient's intentional underdosing of medication regimen for other reason: Secondary | ICD-10-CM

## 2018-07-25 DIAGNOSIS — R0600 Dyspnea, unspecified: Secondary | ICD-10-CM

## 2018-07-25 DIAGNOSIS — I2510 Atherosclerotic heart disease of native coronary artery without angina pectoris: Secondary | ICD-10-CM | POA: Diagnosis not present

## 2018-07-25 DIAGNOSIS — E785 Hyperlipidemia, unspecified: Secondary | ICD-10-CM | POA: Diagnosis present

## 2018-07-25 DIAGNOSIS — I447 Left bundle-branch block, unspecified: Secondary | ICD-10-CM | POA: Diagnosis present

## 2018-07-25 DIAGNOSIS — Z7982 Long term (current) use of aspirin: Secondary | ICD-10-CM

## 2018-07-25 DIAGNOSIS — R739 Hyperglycemia, unspecified: Secondary | ICD-10-CM | POA: Diagnosis present

## 2018-07-25 DIAGNOSIS — I420 Dilated cardiomyopathy: Secondary | ICD-10-CM | POA: Diagnosis present

## 2018-07-25 DIAGNOSIS — L409 Psoriasis, unspecified: Secondary | ICD-10-CM | POA: Diagnosis present

## 2018-07-25 DIAGNOSIS — Z951 Presence of aortocoronary bypass graft: Secondary | ICD-10-CM

## 2018-07-25 DIAGNOSIS — Z6831 Body mass index (BMI) 31.0-31.9, adult: Secondary | ICD-10-CM | POA: Diagnosis not present

## 2018-07-25 DIAGNOSIS — I255 Ischemic cardiomyopathy: Secondary | ICD-10-CM | POA: Diagnosis present

## 2018-07-25 DIAGNOSIS — F17200 Nicotine dependence, unspecified, uncomplicated: Secondary | ICD-10-CM | POA: Diagnosis not present

## 2018-07-25 DIAGNOSIS — I5023 Acute on chronic systolic (congestive) heart failure: Secondary | ICD-10-CM | POA: Diagnosis present

## 2018-07-25 DIAGNOSIS — J441 Chronic obstructive pulmonary disease with (acute) exacerbation: Secondary | ICD-10-CM | POA: Diagnosis present

## 2018-07-25 DIAGNOSIS — Z79899 Other long term (current) drug therapy: Secondary | ICD-10-CM

## 2018-07-25 DIAGNOSIS — I509 Heart failure, unspecified: Secondary | ICD-10-CM | POA: Diagnosis not present

## 2018-07-25 DIAGNOSIS — I272 Pulmonary hypertension, unspecified: Secondary | ICD-10-CM | POA: Diagnosis present

## 2018-07-25 DIAGNOSIS — I472 Ventricular tachycardia: Secondary | ICD-10-CM | POA: Diagnosis not present

## 2018-07-25 DIAGNOSIS — E611 Iron deficiency: Secondary | ICD-10-CM | POA: Diagnosis present

## 2018-07-25 DIAGNOSIS — T50916A Underdosing of multiple unspecified drugs, medicaments and biological substances, initial encounter: Secondary | ICD-10-CM | POA: Diagnosis present

## 2018-07-25 DIAGNOSIS — J432 Centrilobular emphysema: Secondary | ICD-10-CM | POA: Diagnosis not present

## 2018-07-25 DIAGNOSIS — I25118 Atherosclerotic heart disease of native coronary artery with other forms of angina pectoris: Secondary | ICD-10-CM | POA: Diagnosis not present

## 2018-07-25 DIAGNOSIS — I252 Old myocardial infarction: Secondary | ICD-10-CM | POA: Diagnosis not present

## 2018-07-25 DIAGNOSIS — J9601 Acute respiratory failure with hypoxia: Secondary | ICD-10-CM

## 2018-07-25 DIAGNOSIS — I110 Hypertensive heart disease with heart failure: Secondary | ICD-10-CM | POA: Diagnosis not present

## 2018-07-25 DIAGNOSIS — E039 Hypothyroidism, unspecified: Secondary | ICD-10-CM | POA: Diagnosis present

## 2018-07-25 DIAGNOSIS — I340 Nonrheumatic mitral (valve) insufficiency: Secondary | ICD-10-CM | POA: Diagnosis present

## 2018-07-25 DIAGNOSIS — E669 Obesity, unspecified: Secondary | ICD-10-CM | POA: Diagnosis present

## 2018-07-25 DIAGNOSIS — I361 Nonrheumatic tricuspid (valve) insufficiency: Secondary | ICD-10-CM | POA: Diagnosis not present

## 2018-07-25 DIAGNOSIS — Z8249 Family history of ischemic heart disease and other diseases of the circulatory system: Secondary | ICD-10-CM

## 2018-07-25 DIAGNOSIS — I25728 Atherosclerosis of autologous artery coronary artery bypass graft(s) with other forms of angina pectoris: Secondary | ICD-10-CM | POA: Diagnosis not present

## 2018-07-25 DIAGNOSIS — F17210 Nicotine dependence, cigarettes, uncomplicated: Secondary | ICD-10-CM | POA: Diagnosis present

## 2018-07-25 DIAGNOSIS — I5022 Chronic systolic (congestive) heart failure: Secondary | ICD-10-CM | POA: Diagnosis present

## 2018-07-25 DIAGNOSIS — R7989 Other specified abnormal findings of blood chemistry: Secondary | ICD-10-CM | POA: Diagnosis not present

## 2018-07-25 DIAGNOSIS — I5040 Unspecified combined systolic (congestive) and diastolic (congestive) heart failure: Secondary | ICD-10-CM | POA: Diagnosis present

## 2018-07-25 DIAGNOSIS — J811 Chronic pulmonary edema: Secondary | ICD-10-CM | POA: Diagnosis not present

## 2018-07-25 DIAGNOSIS — R0602 Shortness of breath: Secondary | ICD-10-CM | POA: Diagnosis not present

## 2018-07-25 DIAGNOSIS — R918 Other nonspecific abnormal finding of lung field: Secondary | ICD-10-CM | POA: Diagnosis not present

## 2018-07-25 LAB — CBC
HCT: 39.7 % (ref 36.0–46.0)
Hemoglobin: 12.3 g/dL (ref 12.0–15.0)
MCH: 29.2 pg (ref 26.0–34.0)
MCHC: 31 g/dL (ref 30.0–36.0)
MCV: 94.3 fL (ref 80.0–100.0)
Platelets: 326 10*3/uL (ref 150–400)
RBC: 4.21 MIL/uL (ref 3.87–5.11)
RDW: 14.1 % (ref 11.5–15.5)
WBC: 9.5 10*3/uL (ref 4.0–10.5)
nRBC: 0 % (ref 0.0–0.2)

## 2018-07-25 LAB — DIFFERENTIAL
Abs Immature Granulocytes: 0.02 10*3/uL (ref 0.00–0.07)
Basophils Absolute: 0.1 10*3/uL (ref 0.0–0.1)
Basophils Relative: 1 %
Eosinophils Absolute: 0 10*3/uL (ref 0.0–0.5)
Eosinophils Relative: 0 %
Immature Granulocytes: 0 %
LYMPHS ABS: 1.3 10*3/uL (ref 0.7–4.0)
Lymphocytes Relative: 14 %
Monocytes Absolute: 0.7 10*3/uL (ref 0.1–1.0)
Monocytes Relative: 7 %
Neutro Abs: 7.2 10*3/uL (ref 1.7–7.7)
Neutrophils Relative %: 78 %

## 2018-07-25 LAB — HEPATIC FUNCTION PANEL
ALK PHOS: 70 U/L (ref 38–126)
ALT: 24 U/L (ref 0–44)
AST: 24 U/L (ref 15–41)
Albumin: 4.2 g/dL (ref 3.5–5.0)
Bilirubin, Direct: 0.2 mg/dL (ref 0.0–0.2)
Indirect Bilirubin: 0.8 mg/dL (ref 0.3–0.9)
Total Bilirubin: 1 mg/dL (ref 0.3–1.2)
Total Protein: 7.5 g/dL (ref 6.5–8.1)

## 2018-07-25 LAB — BASIC METABOLIC PANEL
Anion gap: 12 (ref 5–15)
BUN: 18 mg/dL (ref 8–23)
CO2: 22 mmol/L (ref 22–32)
Calcium: 9 mg/dL (ref 8.9–10.3)
Chloride: 107 mmol/L (ref 98–111)
Creatinine, Ser: 0.78 mg/dL (ref 0.44–1.00)
GFR calc Af Amer: >60 mL/min (ref >60)
Glucose, Bld: 122 mg/dL — ABNORMAL HIGH (ref 70–99)
POTASSIUM: 3.5 mmol/L (ref 3.5–5.1)
Sodium: 141 mmol/L (ref 135–145)

## 2018-07-25 LAB — MAGNESIUM: Magnesium: 1.7 mg/dL (ref 1.7–2.4)

## 2018-07-25 LAB — TROPONIN I
Troponin I: <0.03 ng/mL (ref <0.03)
Troponin I: <0.03 ng/mL (ref <0.03)

## 2018-07-25 LAB — PHOSPHORUS: Phosphorus: 3.5 mg/dL (ref 2.5–4.6)

## 2018-07-25 LAB — BRAIN NATRIURETIC PEPTIDE: B Natriuretic Peptide: 1289 pg/mL — ABNORMAL HIGH (ref 0.0–100.0)

## 2018-07-25 MED ORDER — FUROSEMIDE 10 MG/ML IJ SOLN
60.0000 mg | Freq: Once | INTRAMUSCULAR | Status: AC
  Administered 2018-07-25: 60 mg via INTRAVENOUS
  Filled 2018-07-25: qty 8

## 2018-07-25 MED ORDER — IPRATROPIUM-ALBUTEROL 0.5-2.5 (3) MG/3ML IN SOLN
3.0000 mL | Freq: Once | RESPIRATORY_TRACT | Status: AC
  Administered 2018-07-25: 3 mL via RESPIRATORY_TRACT
  Filled 2018-07-25: qty 3

## 2018-07-25 MED ORDER — IOHEXOL 350 MG/ML SOLN
75.0000 mL | Freq: Once | INTRAVENOUS | Status: AC | PRN
  Administered 2018-07-25: 75 mL via INTRAVENOUS

## 2018-07-25 MED ORDER — CYCLOBENZAPRINE HCL 10 MG PO TABS
5.0000 mg | ORAL_TABLET | Freq: Once | ORAL | Status: AC
  Administered 2018-07-26: 5 mg via ORAL
  Filled 2018-07-25: qty 1

## 2018-07-25 MED ORDER — ASPIRIN 81 MG PO CHEW
81.0000 mg | CHEWABLE_TABLET | Freq: Every day | ORAL | Status: DC
  Administered 2018-07-26 – 2018-07-30 (×5): 81 mg via ORAL
  Filled 2018-07-25 (×5): qty 1

## 2018-07-25 MED ORDER — METHYLPREDNISOLONE SODIUM SUCC 125 MG IJ SOLR
125.0000 mg | Freq: Once | INTRAMUSCULAR | Status: AC
  Administered 2018-07-25: 125 mg via INTRAVENOUS
  Filled 2018-07-25: qty 2

## 2018-07-25 NOTE — ED Notes (Signed)
Unhooked pt to use bathroom. Pt back in bed. Pt now using pure wick.

## 2018-07-25 NOTE — ED Notes (Signed)
Admitting MD at bedside.

## 2018-07-25 NOTE — ED Triage Notes (Signed)
Pt to triage via wheelchair.  Pt reports swelling of feet and legs for 3 days.  Pt has sob.  No chest pain    States i'm retaining fluid.  Hx cabg.  Pt alert.

## 2018-07-25 NOTE — ED Notes (Signed)
ED TO INPATIENT HANDOFF REPORT  ED Nurse Name and Phone #: [phone]  S Name/Age/Gender Dana [name] 73 y.o. female Room/Bed: ED17A/ED17A  Code Status   Code Status: Prior  Home/SNF/Other Home Patient oriented to: self, place, time and situation Is this baseline? Yes   Triage Complete: Triage complete  Chief Complaint sob  Triage Note FIRST NURSE NOTE-BLE edema and SHOB.  sats 95% at check in.  Pt to triage via wheelchair.  Pt reports swelling of feet and legs for 3 days.  Pt has sob.  No chest pain    States i'm retaining fluid.  Hx cabg.  Pt alert.    Allergies No Known Allergies  Level of Care/Admitting Diagnosis ED Disposition    ED Disposition Condition Comment   Admit  Hospital Area: [facility] [[id number]]  Level of Care: Telemetry [5]  Diagnosis: Acute on chronic systolic CHF (congestive heart failure) [facility]) [[id number]]  Admitting Physician: Barbaraann Rondo [[id number]]  Attending Physician: Barbaraann Rondo [[id number]]  Estimated length of stay: past midnight tomorrow  Certification:: I certify this patient will need inpatient services for at least 2 midnights  PT Class (Do Not Modify): Inpatient [101]  PT Acc Code (Do Not Modify): Private [1]       B Medical/Surgery History Past Medical History:  Diagnosis Date  . CHF (congestive heart failure) (HCC)   . Complication of anesthesia    "difficulty waking up, it was all day long"  . Coronary artery disease   . Hyperlipidemia   . LBBB (left bundle branch block)   . Psoriasis    Past Surgical History:  Procedure Laterality Date  . CARDIAC CATHETERIZATION  02/17/2012  . CORONARY ARTERY BYPASS GRAFT  02/23/2012   Procedure: CORONARY ARTERY BYPASS GRAFTING (CABG);  Surgeon: Loreli Slot, MD;  Location: [facility] OR;  Service: Open Heart Surgery;  Laterality: N/A;  coronary artery bypass graft on pump times two utlizing left internal mammary artery and right greater saphenous vein  harvested endoscopically, transesophageal echocardiogram   . NO PAST SURGERIES    . TEE WITHOUT CARDIOVERSION  02/22/2012   Procedure: TRANSESOPHAGEAL ECHOCARDIOGRAM (TEE);  Surgeon: Dolores Patty, MD;  Location: [facility] ENDOSCOPY;  Service: Cardiovascular;  Laterality: N/A;     A IV Location/Drains/Wounds Patient Lines/Drains/Airways Status   Active Line/Drains/Airways    Name:   Placement date:   Placement time:   Site:   Days:   Peripheral IV 07/25/18 Right Antecubital   07/25/18    1859    Antecubital   less than 1   Incision 02/23/12 Other (Comment)   02/23/12    1005     2344   Incision 02/23/12 Chest Other (Comment)   02/23/12    1152     2344   Incision 02/23/12 Leg Right   02/23/12    1152     2344          Intake/Output Last 24 hours  Intake/Output Summary (Last 24 hours) at 07/25/2018 2341 Last data filed at 07/25/2018 2149 Gross per 24 hour  Intake -  Output 1100 ml  Net -1100 ml    Labs/Imaging Results for orders placed or performed during the hospital encounter of 07/25/18 (from the past 48 hour(s))  Basic metabolic panel     Status: Abnormal   Collection Time: 07/25/18  6:25 PM  Result Value Ref Range   Sodium 141 135 - 145 mmol/L   Potassium 3.5 3.5 - 5.1 mmol/L   Chloride 107 98 -  111 mmol/L   CO2 22 22 - 32 mmol/L   Glucose, Bld 122 (H) 70 - 99 mg/dL   BUN 18 8 - 23 mg/dL   Creatinine, Ser 5.36 0.44 - 1.00 mg/dL   Calcium 9.0 8.9 - 64.4 mg/dL   GFR calc non Af Amer >60 >60 mL/min   GFR calc Af Amer >60 >60 mL/min   Anion gap 12 5 - 15    Comment: Performed at [facility], [address] [place], [place] [address]  CBC     Status: None   Collection Time: 07/25/18  6:25 PM  Result Value Ref Range   WBC 9.5 4.0 - 10.5 K/uL   RBC 4.21 3.87 - 5.11 MIL/uL   Hemoglobin 12.3 12.0 - 15.0 g/dL   HCT 25.9 56.3 - 87.5 %   MCV 94.3 80.0 - 100.0 fL   MCH 29.2 26.0 - 34.0 pg   MCHC 31.0 30.0 - 36.0 g/dL   RDW 64.3 32.9 - 51.8 %   Platelets 326  150 - 400 K/uL   nRBC 0.0 0.0 - 0.2 %    Comment: Performed at [facility], [address] [place], [place] [address]  Troponin I - ONCE - STAT     Status: None   Collection Time: 07/25/18  6:25 PM  Result Value Ref Range   Troponin I <0.03 <0.03 ng/mL    Comment: Performed at [facility], [address] [place], [place] [address]  Brain natriuretic peptide     Status: Abnormal   Collection Time: 07/25/18  6:25 PM  Result Value Ref Range   B Natriuretic Peptide 1,289.0 (H) 0.0 - 100.0 pg/mL    Comment: Performed at [facility], [address] [place], [place] [address]  Differential     Status: None   Collection Time: 07/25/18  8:31 PM  Result Value Ref Range   Neutrophils Relative % 78 %   Neutro Abs 7.2 1.7 - 7.7 K/uL   Lymphocytes Relative 14 %   Lymphs Abs 1.3 0.7 - 4.0 K/uL   Monocytes Relative 7 %   Monocytes Absolute 0.7 0.1 - 1.0 K/uL   Eosinophils Relative 0 %   Eosinophils Absolute 0.0 0.0 - 0.5 K/uL   Basophils Relative 1 %   Basophils Absolute 0.1 0.0 - 0.1 K/uL   Immature Granulocytes 0 %   Abs Immature Granulocytes 0.02 0.00 - 0.07 K/uL    Comment: Performed at [facility], [address] [place], [place] [address]  Troponin I - Once     Status: None   Collection Time: 07/25/18 10:45 PM  Result Value Ref Range   Troponin I <0.03 <0.03 ng/mL    Comment: Performed at [facility], [address] [place], [place] [address]  Hepatic function panel     Status: None   Collection Time: 07/25/18 10:45 PM  Result Value Ref Range   Total Protein 7.5 6.5 - 8.1 g/dL   Albumin 4.2 3.5 - 5.0 g/dL   AST 24 15 - 41 U/L   ALT 24 0 - 44 U/L   Alkaline Phosphatase 70 38 - 126 U/L   Total Bilirubin 1.0 0.3 - 1.2 mg/dL   Bilirubin, Direct 0.2 0.0 - 0.2 mg/dL   Indirect Bilirubin 0.8 0.3 - 0.9 mg/dL    Comment: Performed at [facility], [address], [place], [place] [address]  Magnesium     Status: None    Collection Time:  07/25/18 10:45 PM  Result Value Ref Range   Magnesium 1.7 1.7 - 2.4 mg/dL    Comment: Performed at [facility], [address] [place], [place] [address]  Phosphorus     Status: None   Collection Time: 07/25/18 10:45 PM  Result Value Ref Range   Phosphorus 3.5 2.5 - 4.6 mg/dL    Comment: Performed at [facility], [address] [place], [place] [address]   Dg Chest 2 View  Result Date: 07/25/2018 CLINICAL DATA:  Swelling of the feet and legs for 3 days. EXAM: CHEST - 2 VIEW COMPARISON:  None. FINDINGS: The patient is status post CABG. Cardiomegaly with aortic atherosclerosis is noted, stable in appearance without aneurysmal dilatation. Diffuse mild interstitial prominence may represent bronchitic change. No alveolar consolidation. No cephalization or effusion to suggest edema. No effusion or pneumothorax. IMPRESSION: Cardiomegaly with mild diffuse increase in interstitial prominence may reflect bronchitic change. Electronically Signed   By: Tollie Eth M.D.   On: 07/25/2018 19:22   Ct Angio Chest Pe W And/or Wo Contrast  Result Date: 07/25/2018 CLINICAL DATA:  73 year old female with lower extremity swelling for 3 days. EXAM: CT ANGIOGRAPHY CHEST WITH CONTRAST TECHNIQUE: Multidetector CT imaging of the chest was performed using the standard protocol during bolus administration of intravenous contrast. Multiplanar CT image reconstructions and MIPs were obtained to evaluate the vascular anatomy. CONTRAST:  80mL OMNIPAQUE IOHEXOL 350 MG/ML SOLN COMPARISON:  Chest radiographs earlier today. FINDINGS: Cardiovascular: Excellent contrast bolus timing in the pulmonary arterial tree. Mild respiratory motion. No focal filling defect identified in the pulmonary arteries to suggest acute pulmonary embolism. No contrast in the aorta. Calcified aortic atherosclerosis. Calcified coronary artery atherosclerosis and prior CABG. Cardiomegaly. No pericardial effusion.  Mediastinum/Nodes: Prior CABG. No mediastinal lymphadenopathy. Asymmetric right axillary lymph nodes are at the upper limits of normal (series 4, image 47). Lungs/Pleura: Major airways are patent. There is suggestion of adherent retained secretions in the trachea on series 6, image 19. Mild generalized pulmonary septal thickening. No pulmonary consolidation. Minor pulmonary ground-glass opacity isolated to the azygoesophageal recess (series 6, image 43). There is mild atelectasis associated with cardiomegaly and in the posterior costophrenic angles. No pleural effusion. Upper Abdomen: Round 2.5 centimeter low-density area in the liver has simple fluid density on series 4, image 95. Otherwise negative visible liver, spleen (occasional calcified granulomas), pancreas, adrenal glands, left kidney, and bowel. Musculoskeletal: Prior sternotomy. No acute osseous abnormality identified. Review of the MIP images confirms the above findings. IMPRESSION: 1. No evidence of acute pulmonary embolus. 2. Generalized pulmonary septal thickening suspicious for mild or developing interstitial edema. No pleural effusion. 3. Cardiomegaly. Prior CABG.  Aortic Atherosclerosis (ICD10-I70.0). Electronically Signed   By: Odessa Fleming M.D.   On: 07/25/2018 21:09    Pending Labs Unresulted Labs (From admission, onward)    Start     Ordered   07/26/18 0200  Troponin I - Tomorrow AM 0500  Tomorrow morning,   STAT     07/25/18 2204   Signed and Held  Basic metabolic panel  Daily,   R     Signed and Held          Vitals/Pain Today's Vitals   07/25/18 2115 07/25/18 2116 07/25/18 2117 07/25/18 2240  BP:    136/71  Pulse: (!) 114 (!) 104 87 73  Resp: (!) 31 (!) 29 (!) 34 (!) 29  Temp:      TempSrc:      SpO2: (!) 89%  92% 96% 97%  Weight:      Height:      PainSc:        Isolation Precautions No active isolations  Medications Medications  aspirin chewable tablet 81 mg (has no administration in time range)   cyclobenzaprine (FLEXERIL) tablet 5 mg (has no administration in time range)  ipratropium-albuterol (DUONEB) 0.5-2.5 (3) MG/3ML nebulizer solution 3 mL (3 mLs Nebulization Given 07/25/18 1950)  furosemide (LASIX) injection 60 mg (60 mg Intravenous Given 07/25/18 2024)  methylPREDNISolone sodium succinate (SOLU-MEDROL) 125 mg/2 mL injection 125 mg (125 mg Intravenous Given 07/25/18 2023)  iohexol (OMNIPAQUE) 350 MG/ML injection 75 mL (75 mLs Intravenous Contrast Given 07/25/18 2046)    Mobility walks with person assist Low fall risk   Focused Assessments Pulmonary Assessment Handoff:  Lung sounds: Bilateral Breath Sounds: Expiratory wheezes Oxygen: 2L [place]        R Recommendations: See Admitting Provider Note  Report given to:   Additional Notes: Pt put on 2L [place] due to low o2 sat from walking to commode, not normally on o2

## 2018-07-25 NOTE — ED Triage Notes (Addendum)
FIRST NURSE NOTE-BLE edema and SHOB.  sats 95% at check in.

## 2018-07-25 NOTE — ED Provider Notes (Signed)
[facility] Emergency Department Provider Note    First MD Initiated Contact with Patient 07/25/18 1911     (approximate)  I have reviewed the triage vital signs and the nursing notes.   HISTORY  Chief Complaint Shortness of Breath    HPI Dana [name] is a 73 y.o. female below listed past medical history presents the ER for evaluation of shortness of breath.  Not on any blood thinners.  Denies any chest pain but having significant shortness of breath particular with any exertion.  Also having some orthopnea but can also feel herself wheezing.  Does smoke daily.  Has a history of CABG.  Denies any chest pain at this time.  Feels that she had worsening right lower extremity swelling then going to the left.  Both legs are now swollen.   Past Medical History:  Diagnosis Date  . CHF (congestive heart failure) (HCC)   . Complication of anesthesia    "difficulty waking up, it was all day long"  . Coronary artery disease   . Hyperlipidemia   . LBBB (left bundle branch block)   . Psoriasis    Family History  Problem Relation Age of Onset  . Heart disease Father    Past Surgical History:  Procedure Laterality Date  . CARDIAC CATHETERIZATION  02/17/2012  . CORONARY ARTERY BYPASS GRAFT  02/23/2012   Procedure: CORONARY ARTERY BYPASS GRAFTING (CABG);  Surgeon: Loreli Slot, MD;  Location: [facility] OR;  Service: Open Heart Surgery;  Laterality: N/A;  coronary artery bypass graft on pump times two utlizing left internal mammary artery and right greater saphenous vein harvested endoscopically, transesophageal echocardiogram   . NO PAST SURGERIES    . TEE WITHOUT CARDIOVERSION  02/22/2012   Procedure: TRANSESOPHAGEAL ECHOCARDIOGRAM (TEE);  Surgeon: Dolores Patty, MD;  Location: [facility] ENDOSCOPY;  Service: Cardiovascular;  Laterality: N/A;   Patient Active Problem List   Diagnosis Date Noted  . Acute on chronic systolic CHF (congestive heart failure) (HCC)  07/25/2018  . S/P CABG x 2 04/13/2012  . Mitral regurgitation 02/22/2012  . Acute coronary syndrome (HCC) 02/18/2012  . Cardiomyopathy, ischemic 02/18/2012  . Acute systolic CHF (congestive heart failure) (HCC) 02/18/2012  . Tobacco abuse 02/18/2012  . Left bundle branch block 02/18/2012  . Iron deficiency 02/18/2012      Prior to Admission medications   Medication Sig Start Date End Date Taking? Authorizing Provider  acetaminophen (TYLENOL) 500 MG tablet Take 1,000 mg by mouth every 6 (six) hours as needed. For pain    [provider]  aspirin EC 325 MG EC tablet Take 1 tablet (325 mg total) by mouth daily. 02/29/12   Ardelle Balls, PA-C  atorvastatin (LIPITOR) 80 MG tablet Take 1 tablet (80 mg total) by mouth daily at 6 PM. 04/13/12   Gollan, Tollie Pizza, MD  carvedilol (COREG) 6.25 MG tablet Take 1 tablet (6.25 mg total) by mouth 2 (two) times daily with a meal. 04/13/12   Gollan, Tollie Pizza, MD  ferrous sulfate 325 (65 FE) MG tablet Take 1 tablet (325 mg total) by mouth daily with breakfast. For one month then stop. 02/29/12   Ardelle Balls, PA-C  lisinopril (PRINIVIL,ZESTRIL) 10 MG tablet Take 1 tablet (10 mg total) by mouth daily. 04/13/12   Antonieta Iba, MD  nicotine (NICODERM CQ) 14 mg/24hr patch Place 1 patch onto the skin daily. 04/13/12   Antonieta Iba, MD    Allergies Patient has no known allergies.  Social History Social History   Tobacco Use  . Smoking status: Current Every Day Smoker    Last attempt to quit: 01/31/2012    Years since quitting: 6.4  . Smokeless tobacco: Never Used  . Tobacco comment: Smoked but cut down when started to get sick  Substance Use Topics  . Alcohol use: No  . Drug use: No    Review of Systems Patient denies headaches, rhinorrhea, blurry vision, numbness, shortness of breath, chest pain, edema, cough, abdominal pain, nausea, vomiting, diarrhea, dysuria, fevers, rashes or hallucinations unless  otherwise stated above in HPI. ____________________________________________   PHYSICAL EXAM:  VITAL SIGNS: Vitals:   07/25/18 2117 07/25/18 2240  BP:  136/71  Pulse: 87 73  Resp: (!) 34 (!) 29  Temp:    SpO2: 96% 97%    Constitutional: Alert and oriented.  Eyes: Conjunctivae are normal.  Head: Atraumatic. Nose: No congestion/rhinnorhea. Mouth/Throat: Mucous membranes are moist.   Neck: No stridor. Painless ROM.  Cardiovascular: Normal rate, regular rhythm. Grossly normal heart sounds.  Good peripheral circulation. Respiratory: tachypnea with use of accessory muscles, diminished bs throughout Gastrointestinal: Soft and nontender. No distention. No abdominal bruits. No CVA tenderness. Genitourinary:  Musculoskeletal: No lower extremity tenderness. 2+edema.  No joint effusions. Neurologic:  Normal speech and language. No gross focal neurologic deficits are appreciated. No facial droop Skin:  Skin is warm, dry and intact. No rash noted. Psychiatric: Mood and affect are normal. Speech and behavior are normal.  ____________________________________________   LABS (all labs ordered are listed, but only abnormal results are displayed)  Results for orders placed or performed during the hospital encounter of 07/25/18 (from the past 24 hour(s))  Basic metabolic panel     Status: Abnormal   Collection Time: 07/25/18  6:25 PM  Result Value Ref Range   Sodium 141 135 - 145 mmol/L   Potassium 3.5 3.5 - 5.1 mmol/L   Chloride 107 98 - 111 mmol/L   CO2 22 22 - 32 mmol/L   Glucose, Bld 122 (H) 70 - 99 mg/dL   BUN 18 8 - 23 mg/dL   Creatinine, Ser 7.79 0.44 - 1.00 mg/dL   Calcium 9.0 8.9 - 39.0 mg/dL   GFR calc non Af Amer >60 >60 mL/min   GFR calc Af Amer >60 >60 mL/min   Anion gap 12 5 - 15  CBC     Status: None   Collection Time: 07/25/18  6:25 PM  Result Value Ref Range   WBC 9.5 4.0 - 10.5 K/uL   RBC 4.21 3.87 - 5.11 MIL/uL   Hemoglobin 12.3 12.0 - 15.0 g/dL   HCT 30.0 92.3 -  30.0 %   MCV 94.3 80.0 - 100.0 fL   MCH 29.2 26.0 - 34.0 pg   MCHC 31.0 30.0 - 36.0 g/dL   RDW 76.2 26.3 - 33.5 %   Platelets 326 150 - 400 K/uL   nRBC 0.0 0.0 - 0.2 %  Troponin I - ONCE - STAT     Status: None   Collection Time: 07/25/18  6:25 PM  Result Value Ref Range   Troponin I <0.03 <0.03 ng/mL  Brain natriuretic peptide     Status: Abnormal   Collection Time: 07/25/18  6:25 PM  Result Value Ref Range   B Natriuretic Peptide 1,289.0 (H) 0.0 - 100.0 pg/mL  Differential     Status: None   Collection Time: 07/25/18  8:31 PM  Result Value Ref Range   Neutrophils Relative %  78 %   Neutro Abs 7.2 1.7 - 7.7 K/uL   Lymphocytes Relative 14 %   Lymphs Abs 1.3 0.7 - 4.0 K/uL   Monocytes Relative 7 %   Monocytes Absolute 0.7 0.1 - 1.0 K/uL   Eosinophils Relative 0 %   Eosinophils Absolute 0.0 0.0 - 0.5 K/uL   Basophils Relative 1 %   Basophils Absolute 0.1 0.0 - 0.1 K/uL   Immature Granulocytes 0 %   Abs Immature Granulocytes 0.02 0.00 - 0.07 K/uL  Troponin I - Once     Status: None   Collection Time: 07/25/18 10:45 PM  Result Value Ref Range   Troponin I <0.03 <0.03 ng/mL  Hepatic function panel     Status: None   Collection Time: 07/25/18 10:45 PM  Result Value Ref Range   Total Protein 7.5 6.5 - 8.1 g/dL   Albumin 4.2 3.5 - 5.0 g/dL   AST 24 15 - 41 U/L   ALT 24 0 - 44 U/L   Alkaline Phosphatase 70 38 - 126 U/L   Total Bilirubin 1.0 0.3 - 1.2 mg/dL   Bilirubin, Direct 0.2 0.0 - 0.2 mg/dL   Indirect Bilirubin 0.8 0.3 - 0.9 mg/dL  Magnesium     Status: None   Collection Time: 07/25/18 10:45 PM  Result Value Ref Range   Magnesium 1.7 1.7 - 2.4 mg/dL  Phosphorus     Status: None   Collection Time: 07/25/18 10:45 PM  Result Value Ref Range   Phosphorus 3.5 2.5 - 4.6 mg/dL   ____________________________________________  EKG My review and personal interpretation at Time: 18:13   Indication: sob  Rate: 90  Rhythm: sinus Axis: left Other: lbbb, no sgarbossa criteria  ____________________________________________  RADIOLOGY  I personally reviewed all radiographic images ordered to evaluate for the above acute complaints and reviewed radiology reports and findings.  These findings were personally discussed with the patient.  Please see medical record for radiology report.  ____________________________________________   PROCEDURES  Procedure(s) performed:  .Critical Care Performed by: Willy Eddy, MD Authorized by: Willy Eddy, MD   Critical care provider statement:    Critical care time (minutes):  30   Critical care time was exclusive of:  Separately billable procedures and treating other patients   Critical care was necessary to treat or prevent imminent or life-threatening deterioration of the following conditions:  Respiratory failure   Critical care was time spent personally by me on the following activities:  Development of treatment plan with patient or surrogate, discussions with consultants, evaluation of patient's response to treatment, examination of patient, obtaining history from patient or surrogate, ordering and performing treatments and interventions, ordering and review of laboratory studies, ordering and review of radiographic studies, pulse oximetry, re-evaluation of patient's condition and review of old charts      Critical Care performed: yes ____________________________________________   INITIAL IMPRESSION / ASSESSMENT AND PLAN / ED COURSE  Pertinent labs & imaging results that were available during my care of the patient were reviewed by me and considered in my medical decision making (see chart for details).   DDX: Asthma, copd, CHF, pna, ptx, malignancy, Pe, anemia   Dana [name] is a 73 y.o. who presents to the ED with symptoms as described above.  Patient is short of breath with mild respiratory distress requiring supplemental oxygen.  Blood work sent for above differential.  Chest x-ray shows  cardiomegaly with concerning for bronchitis versus pulmonary edema.  Will trial nebulizer.  Clinical Course as  of Jul 24 2329  Wed Jul 25, 2018  2018 Was some improvement after nebulizer.  Does have enlarged elevated BNP but still fairly tachypneic therefore will give Lasix but will also order CT angiogram to exclude PE.   [PR]  2112 CTA without evidence of PE.  Certainly concerning for developing congestive heart failure.  Patient with improvement respirations on supplemental oxygen.  Will discuss with hospitalist for admission for further medical management.   [PR]    Clinical Course User Index [PR] Willy Eddy, MD     As part of my medical decision making, I reviewed the following data within the electronic MEDICAL RECORD NUMBER Nursing notes reviewed and incorporated, Labs reviewed, notes from prior ED visits and [place] Controlled Substance Database   ____________________________________________   FINAL CLINICAL IMPRESSION(S) / ED DIAGNOSES  Final diagnoses:  Acute on chronic congestive heart failure, unspecified heart failure type (HCC)  Dyspnea, unspecified type  Acute respiratory failure with hypoxia (HCC)      NEW MEDICATIONS STARTED DURING THIS VISIT:  New Prescriptions   No medications on file     Note:  This document was prepared using Dragon voice recognition software and may include unintentional dictation errors.    Willy Eddy, MD 07/25/18 [phone]

## 2018-07-25 NOTE — ED Notes (Signed)
Patient transported to X-ray 

## 2018-07-25 NOTE — ED Notes (Signed)
Pt was walked to bathroom on RA and then walked back to the bed. Pt desatted to 89% and pulse went to 114 and RR to 31. Pt put on 2L [place]. MD notified.

## 2018-07-25 NOTE — ED Notes (Signed)
Patient transported to CT 

## 2018-07-26 ENCOUNTER — Inpatient Hospital Stay (HOSPITAL_COMMUNITY)
Admit: 2018-07-26 | Discharge: 2018-07-26 | Disposition: A | Discharge status: Home / Self Care | Payer: Medicare Other | Attending: Internal Medicine | Admitting: Internal Medicine

## 2018-07-26 ENCOUNTER — Encounter: Payer: Self-pay | Admitting: *Deleted

## 2018-07-26 DIAGNOSIS — I420 Dilated cardiomyopathy: Secondary | ICD-10-CM

## 2018-07-26 DIAGNOSIS — J9601 Acute respiratory failure with hypoxia: Secondary | ICD-10-CM

## 2018-07-26 DIAGNOSIS — J432 Centrilobular emphysema: Secondary | ICD-10-CM

## 2018-07-26 DIAGNOSIS — I5023 Acute on chronic systolic (congestive) heart failure: Secondary | ICD-10-CM

## 2018-07-26 DIAGNOSIS — I361 Nonrheumatic tricuspid (valve) insufficiency: Secondary | ICD-10-CM

## 2018-07-26 DIAGNOSIS — Z951 Presence of aortocoronary bypass graft: Secondary | ICD-10-CM

## 2018-07-26 DIAGNOSIS — I25118 Atherosclerotic heart disease of native coronary artery with other forms of angina pectoris: Secondary | ICD-10-CM

## 2018-07-26 LAB — TSH: TSH: 1.523 u[IU]/mL (ref 0.350–4.500)

## 2018-07-26 LAB — HEMOGLOBIN A1C
Hgb A1c MFr Bld: 5.5 % (ref 4.8–5.6)
MEAN PLASMA GLUCOSE: 111.15 mg/dL

## 2018-07-26 LAB — ECHOCARDIOGRAM COMPLETE
Height: 61 in
Weight: 2636.81 oz

## 2018-07-26 LAB — TROPONIN I: Troponin I: 0.03 ng/mL (ref <0.03)

## 2018-07-26 MED ORDER — ENOXAPARIN SODIUM 40 MG/0.4ML ~~LOC~~ SOLN
40.0000 mg | SUBCUTANEOUS | Status: DC
  Administered 2018-07-26 – 2018-07-27 (×3): 40 mg via SUBCUTANEOUS
  Filled 2018-07-26 (×3): qty 0.4

## 2018-07-26 MED ORDER — CARVEDILOL 6.25 MG PO TABS
6.2500 mg | ORAL_TABLET | Freq: Two times a day (BID) | ORAL | Status: DC
  Administered 2018-07-26 – 2018-07-28 (×5): 6.25 mg via ORAL
  Filled 2018-07-26 (×7): qty 1

## 2018-07-26 MED ORDER — LISINOPRIL 10 MG PO TABS
10.0000 mg | ORAL_TABLET | Freq: Every day | ORAL | Status: DC
  Administered 2018-07-26 – 2018-07-27 (×2): 10 mg via ORAL
  Filled 2018-07-26 (×2): qty 1

## 2018-07-26 MED ORDER — BISACODYL 5 MG PO TBEC: 5.0000 mg | DELAYED_RELEASE_TABLET | Freq: Every day | ORAL | Status: DC | PRN

## 2018-07-26 MED ORDER — ACETAMINOPHEN 325 MG PO TABS
650.0000 mg | ORAL_TABLET | Freq: Four times a day (QID) | ORAL | Status: DC | PRN
  Administered 2018-07-27 – 2018-07-28 (×2): 650 mg via ORAL
  Filled 2018-07-26 (×2): qty 2

## 2018-07-26 MED ORDER — ATORVASTATIN CALCIUM 20 MG PO TABS
80.0000 mg | ORAL_TABLET | Freq: Every day | ORAL | Status: DC
  Administered 2018-07-26 – 2018-07-29 (×4): 80 mg via ORAL
  Filled 2018-07-26 (×4): qty 4

## 2018-07-26 MED ORDER — FUROSEMIDE 10 MG/ML IJ SOLN
40.0000 mg | Freq: Two times a day (BID) | INTRAMUSCULAR | Status: DC
  Administered 2018-07-26 – 2018-07-27 (×3): 40 mg via INTRAVENOUS
  Filled 2018-07-26 (×3): qty 4

## 2018-07-26 MED ORDER — ONDANSETRON HCL 4 MG/2ML IJ SOLN: 4.0000 mg | Freq: Four times a day (QID) | INTRAMUSCULAR | Status: DC | PRN

## 2018-07-26 MED ORDER — SENNOSIDES-DOCUSATE SODIUM 8.6-50 MG PO TABS: 1.0000 | ORAL_TABLET | Freq: Every evening | ORAL | Status: DC | PRN

## 2018-07-26 MED ORDER — IPRATROPIUM-ALBUTEROL 0.5-2.5 (3) MG/3ML IN SOLN
3.0000 mL | Freq: Four times a day (QID) | RESPIRATORY_TRACT | Status: DC
  Administered 2018-07-26 – 2018-07-30 (×14): 3 mL via RESPIRATORY_TRACT
  Filled 2018-07-26 (×14): qty 3

## 2018-07-26 MED ORDER — ACETAMINOPHEN 650 MG RE SUPP: 650.0000 mg | Freq: Four times a day (QID) | RECTAL | Status: DC | PRN

## 2018-07-26 MED ORDER — FUROSEMIDE 40 MG PO TABS
40.0000 mg | ORAL_TABLET | Freq: Every day | ORAL | Status: DC
  Filled 2018-07-26: qty 1

## 2018-07-26 MED ORDER — ONDANSETRON HCL 4 MG PO TABS: 4.0000 mg | ORAL_TABLET | Freq: Four times a day (QID) | ORAL | Status: DC | PRN

## 2018-07-26 MED ORDER — GUAIFENESIN-DM 100-10 MG/5ML PO SYRP
5.0000 mL | ORAL_SOLUTION | ORAL | Status: DC | PRN
  Administered 2018-07-27: 5 mL via ORAL
  Filled 2018-07-26: qty 5

## 2018-07-26 MED ORDER — BUDESONIDE 0.5 MG/2ML IN SUSP
0.5000 mg | Freq: Two times a day (BID) | RESPIRATORY_TRACT | Status: DC
  Administered 2018-07-26 – 2018-07-29 (×7): 0.5 mg via RESPIRATORY_TRACT
  Filled 2018-07-26 (×7): qty 2

## 2018-07-26 NOTE — H&P (Signed)
[facility] [place] at [facility]   PATIENT NAME: Dana [name]    MR#:  [id number]  DATE OF BIRTH:  12-31-1945  DATE OF ADMISSION:  07/25/2018  PRIMARY CARE PHYSICIAN: Patient, No Pcp Per   REQUESTING/REFERRING PHYSICIAN: Willy Eddy, MD  CHIEF COMPLAINT:   Chief Complaint  Patient presents with  . Shortness of Breath    HISTORY OF PRESENT ILLNESS:  Dana [name]  is a 73 y.o. female with a known history of CAD/MI (s/p CABG x2, 2013), chronic systolic CHF (EF 61-22% as of 44/97/5300 Echo) p/w SOB/DOE, decreased exercise tolerance, tachypnea, leg edema, orthopnea, PND, suspected acute on chronic systolic CHF exacerbation (w/o hypoxemic respiratory failure). States she has not seen a cardiologist, "in a long time" (last saw Dr. Mariah Milling on 04/13/2012, per prior documentation). States she took her medications for ~54yr after CABG and initial Dx of CHF in 2013, but states she started feeling better, and therefore stopped taking her medication. She tells me she also smokes. Based on what she tells me, it would appear she has been lost to follow up for many years. She states she has had chronic SOB (characterized as DOE and decreased exercise tolerance) x50yr, though she states this has been mild. Endorses progressively worsening leg edema, though timeframe is unclear. States SOB had been increasing significantly x46mo, and has been severe x1-2d. Endorses orthopnea and PND. States SOB is improved when lying on L side. Denies CP. Endorses cramps/myalgias. Lung exam (+) diffuse fine crackles. BNP 1289. CTA chest (-) PE, (+) cardiomegaly + interstitial edema. Echo pending.   PAST MEDICAL HISTORY:   Past Medical History:  Diagnosis Date  . CHF (congestive heart failure) (HCC)   . Complication of anesthesia    "difficulty waking up, it was all day long"  . Coronary artery disease   . Hyperlipidemia   . LBBB (left bundle branch block)   . Psoriasis     PAST  SURGICAL HISTORY:   Past Surgical History:  Procedure Laterality Date  . CARDIAC CATHETERIZATION  02/17/2012  . CORONARY ARTERY BYPASS GRAFT  02/23/2012   Procedure: CORONARY ARTERY BYPASS GRAFTING (CABG);  Surgeon: Loreli Slot, MD;  Location: [facility] OR;  Service: Open Heart Surgery;  Laterality: N/A;  coronary artery bypass graft on pump times two utlizing left internal mammary artery and right greater saphenous vein harvested endoscopically, transesophageal echocardiogram   . NO PAST SURGERIES    . TEE WITHOUT CARDIOVERSION  02/22/2012   Procedure: TRANSESOPHAGEAL ECHOCARDIOGRAM (TEE);  Surgeon: Dolores Patty, MD;  Location: [facility] ENDOSCOPY;  Service: Cardiovascular;  Laterality: N/A;    SOCIAL HISTORY:   Social History   Tobacco Use  . Smoking status: Current Every Day Smoker    Last attempt to quit: 01/31/2012    Years since quitting: 6.4  . Smokeless tobacco: Never Used  . Tobacco comment: Smoked but cut down when started to get sick  Substance Use Topics  . Alcohol use: No    FAMILY HISTORY:   Family History  Problem Relation Age of Onset  . Heart disease Father     DRUG ALLERGIES:  No Known Allergies  REVIEW OF SYSTEMS:   Review of Systems  Constitutional: Positive for malaise/fatigue. Negative for chills, diaphoresis, fever and weight loss.  HENT: Negative for congestion, ear pain, hearing loss, nosebleeds, sinus pain, sore throat and tinnitus.   Eyes: Negative for blurred vision, double vision and photophobia.  Respiratory: Positive for shortness of breath. Negative for cough,  hemoptysis, sputum production and wheezing.   Cardiovascular: Positive for orthopnea, leg swelling and PND. Negative for chest pain, palpitations and claudication.  Gastrointestinal: Negative for abdominal pain, blood in stool, constipation, diarrhea, heartburn, melena, nausea and vomiting.  Genitourinary: Negative for dysuria, frequency, hematuria and urgency.  Musculoskeletal:  Positive for myalgias. Negative for back pain, joint pain and neck pain.  Skin: Negative for itching and rash.  Neurological: Positive for weakness. Negative for dizziness, tingling, tremors, sensory change, speech change, focal weakness, seizures, loss of consciousness and headaches.  Psychiatric/Behavioral: Negative for depression and memory loss. The patient is not nervous/anxious and does not have insomnia.    MEDICATIONS AT HOME:   Prior to Admission medications   Medication Sig Start Date End Date Taking? Authorizing Provider  acetaminophen (TYLENOL) 500 MG tablet Take 1,000 mg by mouth every 6 (six) hours as needed. For pain    [provider]  aspirin EC 325 MG EC tablet Take 1 tablet (325 mg total) by mouth daily. 02/29/12   Ardelle Balls, PA-C  atorvastatin (LIPITOR) 80 MG tablet Take 1 tablet (80 mg total) by mouth daily at 6 PM. 04/13/12   Gollan, Tollie Pizza, MD  carvedilol (COREG) 6.25 MG tablet Take 1 tablet (6.25 mg total) by mouth 2 (two) times daily with a meal. 04/13/12   Gollan, Tollie Pizza, MD  ferrous sulfate 325 (65 FE) MG tablet Take 1 tablet (325 mg total) by mouth daily with breakfast. For one month then stop. 02/29/12   Ardelle Balls, PA-C  lisinopril (PRINIVIL,ZESTRIL) 10 MG tablet Take 1 tablet (10 mg total) by mouth daily. 04/13/12   Antonieta Iba, MD  nicotine (NICODERM CQ) 14 mg/24hr patch Place 1 patch onto the skin daily. 04/13/12   Antonieta Iba, MD      VITAL SIGNS:  Blood pressure (!) 146/81, pulse 87, temperature 97.9 F (36.6 C), temperature source Oral, resp. rate (!) 24, height 5\' 1"  (1.549 m), weight 74.8 kg, SpO2 99 %.  PHYSICAL EXAMINATION:  Physical Exam Constitutional:      General: She is not in acute distress.    Appearance: She is ill-appearing. She is not toxic-appearing or diaphoretic.     Interventions: She is not intubated. HENT:     Head: Atraumatic.     Mouth/Throat:     Pharynx: Oropharynx is clear.   Eyes:     General: No scleral icterus.    Extraocular Movements: Extraocular movements intact.     Conjunctiva/sclera: Conjunctivae normal.  Neck:     Musculoskeletal: Neck supple.  Cardiovascular:     Rate and Rhythm: Normal rate and regular rhythm.  No extrasystoles are present.    Heart sounds: Normal heart sounds, S1 normal and S2 normal. No murmur. No friction rub. No gallop. No S3 or S4 sounds.   Pulmonary:     Effort: Tachypnea present. No bradypnea, accessory muscle usage, respiratory distress or retractions. She is not intubated.     Breath sounds: Decreased air movement present. No stridor. Examination of the right-upper field reveals decreased breath sounds and rales. Examination of the left-upper field reveals decreased breath sounds and rales. Examination of the right-middle field reveals decreased breath sounds and rales. Examination of the left-middle field reveals decreased breath sounds and rales. Examination of the right-lower field reveals decreased breath sounds and rales. Examination of the left-lower field reveals decreased breath sounds and rales. Decreased breath sounds and rales present. No wheezing or rhonchi.  Abdominal:  General: There is no distension.     Palpations: Abdomen is soft.     Tenderness: There is no abdominal tenderness. There is no guarding or rebound.  Musculoskeletal: Normal range of motion.        General: No tenderness.     Right lower leg: 2+ Pitting Edema present.     Left lower leg: 2+ Pitting Edema present.  Lymphadenopathy:     Cervical: No cervical adenopathy.  Skin:    General: Skin is warm and dry.     Findings: No erythema or rash.  Neurological:     Mental Status: She is alert and oriented to person, place, and time. Mental status is at baseline.  Psychiatric:        Mood and Affect: Mood normal.        Behavior: Behavior normal.        Thought Content: Thought content normal.        Judgment: Judgment normal.     LABORATORY PANEL:   CBC Recent Labs  Lab 07/25/18 1825  WBC 9.5  HGB 12.3  HCT 39.7  PLT 326   ------------------------------------------------------------------------------------------------------------------  Chemistries  Recent Labs  Lab 07/25/18 1825 07/25/18 2245  NA 141  --   K 3.5  --   CL 107  --   CO2 22  --   GLUCOSE 122*  --   BUN 18  --   CREATININE 0.78  --   CALCIUM 9.0  --   MG  --  1.7  AST  --  24  ALT  --  24  ALKPHOS  --  70  BILITOT  --  1.0   ------------------------------------------------------------------------------------------------------------------  Cardiac Enzymes Recent Labs  Lab 07/25/18 2245  TROPONINI <0.03   ------------------------------------------------------------------------------------------------------------------  RADIOLOGY:  Dg Chest 2 View  Result Date: 07/25/2018 CLINICAL DATA:  Swelling of the feet and legs for 3 days. EXAM: CHEST - 2 VIEW COMPARISON:  None. FINDINGS: The patient is status post CABG. Cardiomegaly with aortic atherosclerosis is noted, stable in appearance without aneurysmal dilatation. Diffuse mild interstitial prominence may represent bronchitic change. No alveolar consolidation. No cephalization or effusion to suggest edema. No effusion or pneumothorax. IMPRESSION: Cardiomegaly with mild diffuse increase in interstitial prominence may reflect bronchitic change. Electronically Signed   By: Tollie Eth M.D.   On: 07/25/2018 19:22   Ct Angio Chest Pe W And/or Wo Contrast  Result Date: 07/25/2018 CLINICAL DATA:  73 year old female with lower extremity swelling for 3 days. EXAM: CT ANGIOGRAPHY CHEST WITH CONTRAST TECHNIQUE: Multidetector CT imaging of the chest was performed using the standard protocol during bolus administration of intravenous contrast. Multiplanar CT image reconstructions and MIPs were obtained to evaluate the vascular anatomy. CONTRAST:  75mL OMNIPAQUE IOHEXOL 350 MG/ML SOLN COMPARISON:   Chest radiographs earlier today. FINDINGS: Cardiovascular: Excellent contrast bolus timing in the pulmonary arterial tree. Mild respiratory motion. No focal filling defect identified in the pulmonary arteries to suggest acute pulmonary embolism. No contrast in the aorta. Calcified aortic atherosclerosis. Calcified coronary artery atherosclerosis and prior CABG. Cardiomegaly. No pericardial effusion. Mediastinum/Nodes: Prior CABG. No mediastinal lymphadenopathy. Asymmetric right axillary lymph nodes are at the upper limits of normal (series 4, image 47). Lungs/Pleura: Major airways are patent. There is suggestion of adherent retained secretions in the trachea on series 6, image 19. Mild generalized pulmonary septal thickening. No pulmonary consolidation. Minor pulmonary ground-glass opacity isolated to the azygoesophageal recess (series 6, image 43). There is mild atelectasis associated with cardiomegaly and in  the posterior costophrenic angles. No pleural effusion. Upper Abdomen: Round 2.5 centimeter low-density area in the liver has simple fluid density on series 4, image 95. Otherwise negative visible liver, spleen (occasional calcified granulomas), pancreas, adrenal glands, left kidney, and bowel. Musculoskeletal: Prior sternotomy. No acute osseous abnormality identified. Review of the MIP images confirms the above findings. IMPRESSION: 1. No evidence of acute pulmonary embolus. 2. Generalized pulmonary septal thickening suspicious for mild or developing interstitial edema. No pleural effusion. 3. Cardiomegaly. Prior CABG.  Aortic Atherosclerosis (ICD10-I70.0). Electronically Signed   By: Odessa Fleming M.D.   On: 07/25/2018 21:09   IMPRESSION AND PLAN:   A/P: 1F w/ PMHx CAD/MI (s/p CABG x2, 2013), chronic systolic CHF (EF 62-22% as of 97/98/9211 Echo) p/w SOB/DOE, decreased exercise tolerance, tachypnea, leg edema, orthopnea, PND, suspected acute on chronic systolic CHF exacerbation (w/o hypoxemic respiratory  failure), BNP elevation. Hyperglycemia. -Hx CAD/MI/CABG, Hx chronic systolic CHF, SOB/DOE, decreased exercise tolerance, tachypnea, leg edema, orthopnea, PND, suspected acute on chronic systolic CHF exacerbation: Clinical volume overload. Significant cardiac Hx. Lung exam (+) diffuse fine crackles. BNP 1289. CTA chest (-) PE, (+) cardiomegaly + interstitial edema. Lasix 40mg  IV BID (tranasition to PO), I&O, daily weight, free water restricted diet. Echo pending. c/w ASA, statin, beta blocker, ACE-I. Trend Trop-I, cardiac monitoring, pulse ox, O2. Cardiology consult (if EF severely depressed, may be candidate for Entresto, LifeVest/AICD). -c/w other home meds/formulary subs as tolerated. -FEN/GI: Cardiac free water restricted diet. -DVT PPx: Lovenox. -Code status: full code. -Disposition: Admission, > 2 midnights.   All the records are reviewed and case discussed with ED provider. Management plans discussed with the patient, family and they are in agreement.  CODE STATUS: Full code.  TOTAL TIME TAKING CARE OF THIS PATIENT: 75 minutes.    Barbaraann Rondo M.D on 07/26/2018 at 2:11 AM  Between 7am to 6pm - Pager - [phone]  After 6pm go to [email or web address] - Social research officer, government  [facility] [place] [facility]  Office  [phone]  CC: Primary care physician; Patient, No Pcp Per   Note: This dictation was prepared with Dragon dictation along with smaller phrase technology. Any transcriptional errors that result from this process are unintentional.

## 2018-07-26 NOTE — Progress Notes (Signed)
[facility] - [place] at [facility]     PATIENT NAME: Dana [name]    MR#:  [id number]  DATE OF BIRTH:  Mar 31, 1946  SUBJECTIVE:   Pt. Here due to shortness of breath, cough and noted to be in CHF.  Still has a cough but is nonproductive, patient has not seen a physician and followed up with medical care in years.  REVIEW OF SYSTEMS:    Review of Systems  Constitutional: Negative for chills and fever.  HENT: Negative for congestion and tinnitus.   Eyes: Negative for blurred vision and double vision.  Respiratory: Positive for cough and shortness of breath. Negative for wheezing.   Cardiovascular: Positive for orthopnea. Negative for chest pain and PND.  Gastrointestinal: Negative for abdominal pain, diarrhea, nausea and vomiting.  Genitourinary: Negative for dysuria and hematuria.  Neurological: Negative for dizziness, sensory change and focal weakness.  All other systems reviewed and are negative.   Nutrition: Heart Healthy Tolerating Diet: Yes Tolerating PT: Await Eval.       DRUG ALLERGIES:  No Known Allergies  VITALS:  Blood pressure (!) 106/50, pulse 68, temperature 98.6 F (37 C), temperature source Oral, resp. rate 20, height 5\' 1"  (1.549 m), weight 74.8 kg, SpO2 (!) 87 %.  PHYSICAL EXAMINATION:   Physical Exam  GENERAL:  73 y.o.-year-old patient lying in bed in no acute distress.  EYES: Pupils equal, round, reactive to light and accommodation. No scleral icterus. Extraocular muscles intact.  HEENT: Head atraumatic, normocephalic. Oropharynx and nasopharynx clear.  NECK:  Supple, no jugular venous distention. No thyroid enlargement, no tenderness.  LUNGS: Normal breath sounds bilaterally, no wheezing, bibasilar rales, Occasional rhonchi. No use of accessory muscles of respiration.  CARDIOVASCULAR: S1, S2 normal. No murmurs, rubs, or gallops.  ABDOMEN: Soft, nontender, nondistended. Bowel sounds present. No organomegaly or mass.   EXTREMITIES: No cyanosis, clubbing or edema b/l.    NEUROLOGIC: Cranial nerves II through XII are intact. No focal Motor or sensory deficits b/l.   PSYCHIATRIC: The patient is alert and oriented x 3.  SKIN: No obvious rash, lesion, or ulcer.    LABORATORY PANEL:   CBC Recent Labs  Lab 07/25/18 1825  WBC 9.5  HGB 12.3  HCT 39.7  PLT 326   ------------------------------------------------------------------------------------------------------------------  Chemistries  Recent Labs  Lab 07/25/18 1825 07/25/18 2245  NA 141  --   K 3.5  --   CL 107  --   CO2 22  --   GLUCOSE 122*  --   BUN 18  --   CREATININE 0.78  --   CALCIUM 9.0  --   MG  --  1.7  AST  --  24  ALT  --  24  ALKPHOS  --  70  BILITOT  --  1.0   ------------------------------------------------------------------------------------------------------------------  Cardiac Enzymes Recent Labs  Lab 07/26/18 0214  TROPONINI 0.03*   ------------------------------------------------------------------------------------------------------------------  RADIOLOGY:  Dg Chest 2 View  Result Date: 07/25/2018 CLINICAL DATA:  Swelling of the feet and legs for 3 days. EXAM: CHEST - 2 VIEW COMPARISON:  None. FINDINGS: The patient is status post CABG. Cardiomegaly with aortic atherosclerosis is noted, stable in appearance without aneurysmal dilatation. Diffuse mild interstitial prominence may represent bronchitic change. No alveolar consolidation. No cephalization or effusion to suggest edema. No effusion or pneumothorax. IMPRESSION: Cardiomegaly with mild diffuse increase in interstitial prominence may reflect bronchitic change. Electronically Signed   By: Tollie Eth M.D.   On: 07/25/2018 19:22   Ct  Angio Chest Pe W And/or Wo Contrast  Result Date: 07/25/2018 CLINICAL DATA:  73 year old female with lower extremity swelling for 3 days. EXAM: CT ANGIOGRAPHY CHEST WITH CONTRAST TECHNIQUE: Multidetector CT imaging of the chest  was performed using the standard protocol during bolus administration of intravenous contrast. Multiplanar CT image reconstructions and MIPs were obtained to evaluate the vascular anatomy. CONTRAST:  81mL OMNIPAQUE IOHEXOL 350 MG/ML SOLN COMPARISON:  Chest radiographs earlier today. FINDINGS: Cardiovascular: Excellent contrast bolus timing in the pulmonary arterial tree. Mild respiratory motion. No focal filling defect identified in the pulmonary arteries to suggest acute pulmonary embolism. No contrast in the aorta. Calcified aortic atherosclerosis. Calcified coronary artery atherosclerosis and prior CABG. Cardiomegaly. No pericardial effusion. Mediastinum/Nodes: Prior CABG. No mediastinal lymphadenopathy. Asymmetric right axillary lymph nodes are at the upper limits of normal (series 4, image 47). Lungs/Pleura: Major airways are patent. There is suggestion of adherent retained secretions in the trachea on series 6, image 19. Mild generalized pulmonary septal thickening. No pulmonary consolidation. Minor pulmonary ground-glass opacity isolated to the azygoesophageal recess (series 6, image 43). There is mild atelectasis associated with cardiomegaly and in the posterior costophrenic angles. No pleural effusion. Upper Abdomen: Round 2.5 centimeter low-density area in the liver has simple fluid density on series 4, image 95. Otherwise negative visible liver, spleen (occasional calcified granulomas), pancreas, adrenal glands, left kidney, and bowel. Musculoskeletal: Prior sternotomy. No acute osseous abnormality identified. Review of the MIP images confirms the above findings. IMPRESSION: 1. No evidence of acute pulmonary embolus. 2. Generalized pulmonary septal thickening suspicious for mild or developing interstitial edema. No pleural effusion. 3. Cardiomegaly. Prior CABG.  Aortic Atherosclerosis (ICD10-I70.0). Electronically Signed   By: Odessa Fleming M.D.   On: 07/25/2018 21:09     ASSESSMENT AND PLAN:    73 year old female with past medical history of hypertension, tobacco abuse, hyperlipidemia COPD who presented to the hospital due to shortness of breath and cough.  1.  Acute respiratory failure with hypoxia-secondary to underlying CHF and also COPD. - Continue O2 supplementation, continue diuresis with IV Lasix. -We will also treat underlying COPD with scheduled duo nebs, Pulmicort nebs.  2.  CHF- suspected to be acute on chronic systolic CHF.  Patient's previous EF was 15 to 20% and she has not followed up with a physician for years. - Continue diuresis with IV Lasix, follow I's and O's and daily weights.  Continue carvedilol, lisinopril. -Await repeat echocardiogram results.  Appreciate cardiology input.  3.  COPD- mild acute exacerbation.  No need for IV steroids.  Will place on scheduled duo nebs, Pulmicort nebs.  4.  Essential hypertension-continue carvedilol, lisinopril.     All the records are reviewed and case discussed with Care Management/Social Worker. Management plans discussed with the patient, family and they are in agreement.  CODE STATUS: Full code  DVT Prophylaxis: Lovenox  TOTAL TIME TAKING CARE OF THIS PATIENT: 30 minutes.   POSSIBLE D/C IN 1-2 DAYS, DEPENDING ON CLINICAL CONDITION.   [place] Siren M.D on 07/26/2018 at 3:19 PM  Between 7am to 6pm - Pager - [phone]  After 6pm go to [email or web address] - Social research officer, government  [facility] [place] [facility]  Office  [phone]  CC: Primary care physician; Patient, No Pcp Per

## 2018-07-26 NOTE — Progress Notes (Signed)
*  PRELIMINARY RESULTS* Echocardiogram 2D Echocardiogram has been performed.  Cristela Blue 07/26/2018, 1:46 PM

## 2018-07-26 NOTE — Consult Note (Addendum)
Cardiology Consultation:   Patient ID: Dana [name] MRN: [id number]; DOB: 02/28/1946  Admit date: 07/25/2018 Date of Consult: 07/26/2018  Primary Care Provider: Patient, No Pcp Per Primary Cardiologist: Julien Nordmannimothy Gollan, MD  Primary Electrophysiologist:  None    Patient Profile:   Dana [name] Kreps is a 73 y.o. female with a hx of CAD s/p 01/2012 bypass surgery, HFrEF / ICM with 2013 TTE showing EF 15 to 20 % and MR, and current smoker, who is being seen today for the evaluation of shortness of breath with known heart failure & at the request of Dr. Cherlynn KaiserSainani.  History of Present Illness:   Dana [name] is a 73 year old female last seen by Dr. Mariah MillingGollan on 04/13/2012 after CABG and lost to follow-up.  She [facility] in October 2013 with EF of 15-20% on TEE and cardiac catheterization showing severe proximal LAD and ostial diagonal disease & moderate proximal left circumflex disease.  She was transferred to [facility] [place] and underwent coronary bypass grafting x2 on 02/23/2012 with epicardial pacemaker lead on the posterior lateral wall in the event she needed BiV pacer in the future.  At follow-up on 12/13, it was noted she continued to smoke.  She denied significant chest pain with exertion (decreased exercise tolerance), and physical exam was notable for decreased breath sounds at the left base.  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  EKG showed NSR, LBBB, 95 bpm.  She continued ASA at 325 mg daily, Lipitor 80 mg daily, Coreg 6.25 mg twice daily, and lisinopril 10 mg daily.   As above, she was then lost to follow-up.  She reportedly stopped taking her medication when she started to feel better, approximately 1 year following her CABG.  She continued to smoke.  On 07/25/2018, she presented to [facility] ED with complaint of progressive shortness of breath over the last few weeks at rest and lower extremity edema with patient report that right sided lower  extremity edema was worse than left.  She denied chest pain, palpitations, or racing heart rate at that time. She did report chronic orthopnea, as well as wheezing.  In the ED, she was noted to be tachypneic with LEE and with use of accessory muscles and diminished breath sounds throughout her lung fields. Vitals significant for respiration rate of 34 and oxygen saturation of 96% ORA.  EKG showed NSR , left axis deviation and known left bundle branch block, 90 bpm.  She received a nebulizer treatment with some improvement in her shortness of breath.  She was still noted to be tachypneic; therefore, she was then started on Lasix and supplemental oxygen.  CTA showed calcified aortic atherosclerosis and prior CABG, cardiomegaly, and groundglass opacity with pulmonary septal thickening.  No pleural effusion or sign of acute pulmonary embolus on CTA.  Labs significant for BNP 1289, troponin 0 0.032, phosphorus 3.5, magnesium 1.7,. TTE pending.  She was admitted for further evaluation with cardiology consulted.  Past Medical History:  Diagnosis Date   CHF (congestive heart failure) (HCC)    Complication of anesthesia    "difficulty waking up, it was all day long"   Coronary artery disease    Hyperlipidemia    LBBB (left bundle branch block)    Psoriasis     Past Surgical History:  Procedure Laterality Date   CARDIAC CATHETERIZATION  02/17/2012   CORONARY ARTERY BYPASS GRAFT  02/23/2012   Procedure: CORONARY ARTERY BYPASS GRAFTING (CABG);  Surgeon: Loreli SlotSteven C [name],  MD;  Location: [facility] OR;  Service: Open Heart Surgery;  Laterality: N/A;  coronary artery bypass graft on pump times two utlizing left internal mammary artery and right greater saphenous vein harvested endoscopically, transesophageal echocardiogram    NO PAST SURGERIES     TEE WITHOUT CARDIOVERSION  02/22/2012   Procedure: TRANSESOPHAGEAL ECHOCARDIOGRAM (TEE);  Surgeon: Dolores Patty, MD;  Location: [facility] ENDOSCOPY;  Service:  Cardiovascular;  Laterality: N/A;     Home Medications:  Prior to Admission medications   Medication Sig Start Date End Date Taking? Authorizing Provider  acetaminophen (TYLENOL) 500 MG tablet Take 1,000 mg by mouth every 6 (six) hours as needed. For pain    [provider]  aspirin EC 325 MG EC tablet Take 1 tablet (325 mg total) by mouth daily. 02/29/12   Ardelle Balls, PA-C  atorvastatin (LIPITOR) 80 MG tablet Take 1 tablet (80 mg total) by mouth daily at 6 PM. 04/13/12   Gollan, Tollie Pizza, MD  carvedilol (COREG) 6.25 MG tablet Take 1 tablet (6.25 mg total) by mouth 2 (two) times daily with a meal. 04/13/12   Gollan, Tollie Pizza, MD  ferrous sulfate 325 (65 FE) MG tablet Take 1 tablet (325 mg total) by mouth daily with breakfast. For one month then stop. 02/29/12   Ardelle Balls, PA-C  lisinopril (PRINIVIL,ZESTRIL) 10 MG tablet Take 1 tablet (10 mg total) by mouth daily. 04/13/12   Antonieta Iba, MD  nicotine (NICODERM CQ) 14 mg/24hr patch Place 1 patch onto the skin daily. 04/13/12   Antonieta Iba, MD    Inpatient Medications: Scheduled Meds:  aspirin  81 mg Oral Daily   atorvastatin  80 mg Oral q1800   budesonide (PULMICORT) nebulizer solution  0.5 mg Nebulization BID   carvedilol  6.25 mg Oral BID WC   enoxaparin (LOVENOX) injection  40 mg Subcutaneous Q24H   furosemide  40 mg Intravenous BID   ipratropium-albuterol  3 mL Nebulization Q6H   lisinopril  10 mg Oral Daily   Continuous Infusions:  PRN Meds: acetaminophen **OR** acetaminophen, bisacodyl, guaiFENesin-dextromethorphan, ondansetron **OR** ondansetron (ZOFRAN) IV, senna-docusate  Allergies:   No Known Allergies  Social History:   Social History   Socioeconomic History   Marital status: Divorced    Spouse name: Not on file   Number of children: Not on file   Years of education: Not on file   Highest education level: Not on file  Occupational History   Not on file    Social Needs   Financial resource strain: Not on file   Food insecurity:    Worry: Not on file    Inability: Not on file   Transportation needs:    Medical: Not on file    Non-medical: Not on file  Tobacco Use   Smoking status: Current Every Day Smoker    Last attempt to quit: 01/31/2012    Years since quitting: 6.4   Smokeless tobacco: Never Used   Tobacco comment: Smoked but cut down when started to get sick  Substance and Sexual Activity   Alcohol use: No   Drug use: No   Sexual activity: Not on file  Lifestyle   Physical activity:    Days per week: Not on file    Minutes per session: Not on file   Stress: Not on file  Relationships   Social connections:    Talks on phone: Not on file    Gets together: Not on file  Attends religious service: Not on file    Active member of club or organization: Not on file    Attends meetings of clubs or organizations: Not on file    Relationship status: Not on file   Intimate partner violence:    Fear of current or ex partner: Not on file    Emotionally abused: Not on file    Physically abused: Not on file    Forced sexual activity: Not on file  Other Topics Concern   Not on file  Social History Narrative   Not on file    Family History:    Family History  Problem Relation Age of Onset   Heart disease Father      ROS:  Please see the history of present illness.  Review of Systems  Constitutional: Positive for malaise/fatigue.  Respiratory: Positive for shortness of breath and wheezing.   Cardiovascular: Positive for orthopnea, leg swelling and PND. Negative for chest pain.  Musculoskeletal: Positive for myalgias.  All other systems reviewed and are negative.   All other ROS reviewed and negative.     Physical Exam/Data:   Vitals:   07/25/18 2330 07/26/18 0028 07/26/18 0542 07/26/18 0749  BP: 126/86 (!) 146/81 129/62 (!) 106/50  Pulse: 81 87 74 68  Resp: (!) 22 (!) 24 (!) 24 20  Temp:  97.9 F  (36.6 C) 97.6 F (36.4 C) 98.6 F (37 C)  TempSrc:  Oral Oral Oral  SpO2: 97% 99% 98% 98%  Weight:  74.8 kg 74.8 kg   Height:  5\' 1"  (1.549 m)      Intake/Output Summary (Last 24 hours) at 07/26/2018 1038 Last data filed at 07/26/2018 0500 Gross per 24 hour  Intake 240 ml  Output 3000 ml  Net -2760 ml   Filed Weights   07/25/18 1819 07/26/18 0028 07/26/18 0542  Weight: 72.6 kg 74.8 kg 74.8 kg   Body mass index is 31.14 kg/m.  Physical exam per MD, in accordance to current CDC COVID-19 precautions to reduce exposure.  EKG:  The EKG was personally reviewed and demonstrates: NSR, known LBBB Telemetry:  Telemetry was personally reviewed and demonstrates: SR  Relevant CV Studies: Pending updated echocardiogram  TEE 02/22/2012 Study Conclusions - Left ventricle: The cavity size was moderately dilated. Systolic function was severely reduced. The estimated ejection fraction was in the range of 15% to 20%. - Aortic valve: No evidence of vegetation. Trivial regurgitation. - Aorta: The aorta was moderately calcified. - Mitral valve: No evidence of vegetation. Mild to moderate regurgitation directed centrally. - Right ventricle: Systolic function was mildly to moderately reduced. - Tricuspid valve: No evidence of vegetation. Transesophageal echocardiography. 2D and color Doppler. Patient status: Inpatient. Location: Endoscopy.  TEE See scan under cv procedure  Laboratory Data:  Chemistry Recent Labs  Lab 07/25/18 1825  NA 141  K 3.5  CL 107  CO2 22  GLUCOSE 122*  BUN 18  CREATININE 0.78  CALCIUM 9.0  GFRNONAA >60  GFRAA >60  ANIONGAP 12    Recent Labs  Lab 07/25/18 2245  PROT 7.5  ALBUMIN 4.2  AST 24  ALT 24  ALKPHOS 70  BILITOT 1.0   Hematology Recent Labs  Lab 07/25/18 1825  WBC 9.5  RBC 4.21  HGB 12.3  HCT 39.7  MCV 94.3  MCH 29.2  MCHC 31.0  RDW 14.1  PLT 326   Cardiac Enzymes Recent Labs  Lab 07/25/18 1825  07/25/18 2245 07/26/18 0214  TROPONINI <0.03 <0.03 0.03*  No results for input(s): TROPIPOC in the last 168 hours.  BNP Recent Labs  Lab 07/25/18 1825  BNP 1,289.0*    DDimer No results for input(s): DDIMER in the last 168 hours.  Radiology/Studies:  Dg Chest 2 View  Result Date: 07/25/2018 CLINICAL DATA:  Swelling of the feet and legs for 3 days. EXAM: CHEST - 2 VIEW COMPARISON:  None. FINDINGS: The patient is status post CABG. Cardiomegaly with aortic atherosclerosis is noted, stable in appearance without aneurysmal dilatation. Diffuse mild interstitial prominence may represent bronchitic change. No alveolar consolidation. No cephalization or effusion to suggest edema. No effusion or pneumothorax. IMPRESSION: Cardiomegaly with mild diffuse increase in interstitial prominence may reflect bronchitic change. Electronically Signed   By: Tollie Eth M.D.   On: 07/25/2018 19:22   Ct Angio Chest Pe W And/or Wo Contrast  Result Date: 07/25/2018 CLINICAL DATA:  73 year old female with lower extremity swelling for 3 days. EXAM: CT ANGIOGRAPHY CHEST WITH CONTRAST TECHNIQUE: Multidetector CT imaging of the chest was performed using the standard protocol during bolus administration of intravenous contrast. Multiplanar CT image reconstructions and MIPs were obtained to evaluate the vascular anatomy. CONTRAST:  51mL OMNIPAQUE IOHEXOL 350 MG/ML SOLN COMPARISON:  Chest radiographs earlier today. FINDINGS: Cardiovascular: Excellent contrast bolus timing in the pulmonary arterial tree. Mild respiratory motion. No focal filling defect identified in the pulmonary arteries to suggest acute pulmonary embolism. No contrast in the aorta. Calcified aortic atherosclerosis. Calcified coronary artery atherosclerosis and prior CABG. Cardiomegaly. No pericardial effusion. Mediastinum/Nodes: Prior CABG. No mediastinal lymphadenopathy. Asymmetric right axillary lymph nodes are at the upper limits of normal (series 4, image  47). Lungs/Pleura: Major airways are patent. There is suggestion of adherent retained secretions in the trachea on series 6, image 19. Mild generalized pulmonary septal thickening. No pulmonary consolidation. Minor pulmonary ground-glass opacity isolated to the azygoesophageal recess (series 6, image 43). There is mild atelectasis associated with cardiomegaly and in the posterior costophrenic angles. No pleural effusion. Upper Abdomen: Round 2.5 centimeter low-density area in the liver has simple fluid density on series 4, image 95. Otherwise negative visible liver, spleen (occasional calcified granulomas), pancreas, adrenal glands, left kidney, and bowel. Musculoskeletal: Prior sternotomy. No acute osseous abnormality identified. Review of the MIP images confirms the above findings. IMPRESSION: 1. No evidence of acute pulmonary embolus. 2. Generalized pulmonary septal thickening suspicious for mild or developing interstitial edema. No pleural effusion. 3. Cardiomegaly. Prior CABG.  Aortic Atherosclerosis (ICD10-I70.0). Electronically Signed   By: Odessa Fleming M.D.   On: 07/25/2018 21:09    Assessment and Plan:   Acute on chronic systolic CHF - As above in HPI, previous transesophageal echocardiogram with EF 15 to 20% s/p CABG in 2013.  Lost to follow-up and noncompliant with medications after 2014. - Symptomatic with shortness of breath, orthopnea, LEE, DOE and elevated BNP 1289 as above in HPI. Desaturation into the 80s on review of EMR and pending COVID19 rule out for ground glass on CT and MD to see patient. - Continue oxygen and breathing treatments as needed for shortness of breath.  Restarted previous cardiac medications including Coreg 6.25 twice daily and lisinopril 10 mg daily.  Daily I's/O, daily standing weights.  Continue Lasix 40 mg twice daily for diuresis with renal function stable. Continue to monitor with daily BMET, given diuresis.  Further recommendations per MD to see patient.  History of  CAD/ICM s/p CABG x2 in 2013, elevated troponin without chest pain - No reported chest pain.  Unclear at  this time if shortness of breath is anginal equivalent and pending updated labs and echocardiogram (given lost to follow-up and stopped taking cardiac medications in 2014).  Known CAD with CABG in 2013 and current smoker.  Troponin minimally elevated and flat trending at 0.032. - Continue restarted aspirin at reduced  Dosage from  to recommended 81 mg daily, beta-blocker, atorvastatin, and lisinopril.  Pending updated echocardiogram.  If EF remains significantly reduced, could consider Entresto.  Pending updated LDL, A1c, and TSH to further risk-stratify.  Further recommendations pending MD to see patient updated labs.  COVID-19 Rule Out - Etiology of shortness of breath likely multifactorial given imaging as above with groundglass opacity and known systolic heart failure with history of CAD / CABG and HFrEF with severely reduced EF.  Also considered is current smoking status.  Unclear at this time if shortness of breath is anginal equivalent and pending updated labs and echocardiogram.  Desatted to 80s and put on oxygen and breathing treatments.  Recommend COVID-19 rule out and precautions and per current CDC guidelines and current shortness of breath and imaging.  Medication noncompliance - Medication compliance encouraged.  Tobacco abuse - Cessation advised   For questions or updates, please contact [facility] Please consult [email or web address] for contact info under     Signed, Lennon Alstrom, PA-C  07/26/2018 10:38 AM

## 2018-07-26 NOTE — TOC Initial Note (Signed)
Transition of Care [facility]) - Initial/Assessment Note    Patient Details  Name: Dana [name] MRN: [id number] Date of Birth: 22-Feb-1946  Transition of Care [facility]) CM/SW Contact:    Sherren Kerns, RN Phone Number: 07/26/2018, 10:46 AM  Clinical Narrative:      Patient is independent from home; lives with son.  Has lots of family support.  Denies difficulties obtaining medications or accessing medical care.  Admitted with SOB, bilateral leg swellling, acute oxygen at 2L; weaned to room air this morning with sats at 98%.  Receiving IV Lasix 40mg  BID.  She has Medicare; no PCP.  She states her son goes to [facility] and she will make appointment with them.  Asked if I could make appointment for her and she declined.  She does not use any DME at home.  No issues with transportation as patient still drives.  Will have Heart Failure appointment scheduled.    She has a functioning scale and verbalizes understanding of importance of weighing daily.  No further needs identified at this time.           Expected Discharge Plan: Home/Self Care Barriers to Discharge: Continued Medical Work up   Patient Goals and CMS Choice Patient states their goals for this hospitalization and ongoing recovery are:: get well and go home      Expected Discharge Plan and Services Expected Discharge Plan: Home/Self Care   Discharge Planning Services: CM Consult, HF Clinic   Living arrangements for the past 2 months: Single Family Home                     HH Arranged: Patient Refused HH    Prior Living Arrangements/Services Living arrangements for the past 2 months: Single Family Home Lives with:: Adult Children   Do you feel safe going back to the place where you live?: Yes        Care giver support system in place?: Yes (comment)(patients sons and daughter)   Criminal Activity/Legal Involvement Pertinent to Current Situation/Hospitalization: No - Comment as needed  Activities of Daily Living Home  Assistive Devices/Equipment: Eyeglasses ADL Screening (condition at time of admission) Patient's cognitive ability adequate to safely complete daily activities?: Yes Is the patient deaf or have difficulty hearing?: Yes Does the patient have difficulty seeing, even when wearing glasses/contacts?: No Does the patient have difficulty concentrating, remembering, or making decisions?: No Patient able to express need for assistance with ADLs?: Yes Does the patient have difficulty dressing or bathing?: No Independently performs ADLs?: Yes (appropriate for developmental age) Does the patient have difficulty walking or climbing stairs?: Yes Weakness of Legs: None Weakness of Arms/Hands: None  Permission Sought/Granted                  Emotional Assessment Appearance:: Appears stated age Attitude/Demeanor/Rapport: Gracious Affect (typically observed): Accepting Orientation: : Oriented to Self, Oriented to Place, Oriented to Situation, Oriented to  Time Alcohol / Substance Use: Not Applicable    Admission diagnosis:  Acute respiratory failure with hypoxia (HCC) [J96.01] Dyspnea, unspecified type [R06.00] Acute on chronic congestive heart failure, unspecified heart failure type [facility]) [I50.9] Patient Active Problem List   Diagnosis Date Noted  . Acute on chronic systolic CHF (congestive heart failure) (HCC) 07/25/2018  . S/P CABG x 2 04/13/2012  . Mitral regurgitation 02/22/2012  . Acute coronary syndrome (HCC) 02/18/2012  . Cardiomyopathy, ischemic 02/18/2012  . Acute systolic CHF (congestive heart failure) (HCC) 02/18/2012  . Tobacco abuse 02/18/2012  .  Left bundle branch block 02/18/2012  . Iron deficiency 02/18/2012   PCP:  Patient, No Pcp Per Pharmacy:   [facility] [address], [place] - [address] [place] [address] Phone: [phone] Fax: [phone]     Social Determinants of Health (SDOH) Interventions    Readmission Risk  Interventions Readmission Risk Prevention Plan 07/26/2018  Post Dischage Appt Complete  Medication Screening Complete  Transportation Screening Complete  Some recent data might be hidden

## 2018-07-26 NOTE — Progress Notes (Signed)
Paged on call hospitalist to inform of abnormal lab value.  Troponin 0.03.  No new orders received.

## 2018-07-27 DIAGNOSIS — I25728 Atherosclerosis of autologous artery coronary artery bypass graft(s) with other forms of angina pectoris: Secondary | ICD-10-CM

## 2018-07-27 DIAGNOSIS — R0600 Dyspnea, unspecified: Secondary | ICD-10-CM

## 2018-07-27 LAB — LIPID PANEL
CHOLESTEROL: 156 mg/dL (ref 0–200)
HDL: 49 mg/dL (ref >40)
LDL CALC: 87 mg/dL (ref 0–99)
TRIGLYCERIDES: 99 mg/dL (ref <150)
Total CHOL/HDL Ratio: 3.2 RATIO
VLDL: 20 mg/dL (ref 0–40)

## 2018-07-27 LAB — BASIC METABOLIC PANEL
Anion gap: 12 (ref 5–15)
BUN: 29 mg/dL — ABNORMAL HIGH (ref 8–23)
CHLORIDE: 101 mmol/L (ref 98–111)
CO2: 28 mmol/L (ref 22–32)
Calcium: 9 mg/dL (ref 8.9–10.3)
Creatinine, Ser: 1.19 mg/dL — ABNORMAL HIGH (ref 0.44–1.00)
GFR calc Af Amer: 52 mL/min — ABNORMAL LOW (ref >60)
GFR calc non Af Amer: 45 mL/min — ABNORMAL LOW (ref >60)
Glucose, Bld: 96 mg/dL (ref 70–99)
Potassium: 3.5 mmol/L (ref 3.5–5.1)
SODIUM: 141 mmol/L (ref 135–145)

## 2018-07-27 MED ORDER — ENSURE ENLIVE PO LIQD
237.0000 mL | ORAL | Status: DC
  Administered 2018-07-28 – 2018-07-29 (×2): 237 mL via ORAL

## 2018-07-27 MED ORDER — CLOBETASOL PROPIONATE 0.05 % EX CREA
TOPICAL_CREAM | Freq: Two times a day (BID) | CUTANEOUS | Status: DC
  Administered 2018-07-27 – 2018-07-29 (×6): via TOPICAL
  Filled 2018-07-27: qty 15

## 2018-07-27 MED ORDER — FUROSEMIDE 10 MG/ML IJ SOLN
40.0000 mg | Freq: Every day | INTRAMUSCULAR | Status: DC
  Administered 2018-07-28: 40 mg via INTRAVENOUS
  Filled 2018-07-27: qty 4

## 2018-07-27 NOTE — Plan of Care (Signed)
Nutrition Education Note  RD consulted for nutrition education regarding CHF.  Spoke with pt via the phone. Reviewed patient's dietary recall. Provided examples on ways to decrease sodium intake in diet. Discouraged intake of processed foods and use of salt shaker. Encouraged fresh fruits and vegetables as well as whole grain sources of carbohydrates to maximize fiber intake.   RD discussed why it is important for patient to adhere to diet recommendations, and emphasized the role of fluids, foods to avoid, and importance of weighing self daily. Teach back method used.  Expect fair compliance.  Body mass index is 30.5 kg/m. Pt meets criteria for overweight based on current BMI.  Current diet order is HH, patient is consuming approximately 100% of meals at this time. Labs and medications reviewed. No further nutrition interventions warranted at this time. RD contact information provided. If additional nutrition issues arise, please re-consult RD.   Betsey Holiday MS, RD, LDN Pager #- [phone] Office#- [phone] After Hours Pager: [phone]

## 2018-07-27 NOTE — Progress Notes (Addendum)
Progress Note  Patient Name: Dana [name] Date of Encounter: 07/27/2018  Primary Cardiologist: Julien Nordmann, MD   Subjective   She denied any chest pain, palpitations, or feeling of racing heart rate.    She continues to report intermittent shortness of breath, alleviated this admission with breathing treatments and frequent urination.  We discussed Monday 07/30/2018's plans for L/R cardiac catheterization with patient reporting that she has no further questions regarding the procedure.  She did express her hope she can go home after Monday's catheterization, adding "last time, I needed the grafting, and I would rather not need that again."   Inpatient Medications    Scheduled Meds:  aspirin  81 mg Oral Daily   atorvastatin  80 mg Oral q1800   budesonide (PULMICORT) nebulizer solution  0.5 mg Nebulization BID   carvedilol  6.25 mg Oral BID WC   enoxaparin (LOVENOX) injection  40 mg Subcutaneous Q24H   furosemide  40 mg Intravenous BID   ipratropium-albuterol  3 mL Nebulization Q6H   lisinopril  10 mg Oral Daily   Continuous Infusions:  PRN Meds: acetaminophen **OR** acetaminophen, bisacodyl, guaiFENesin-dextromethorphan, ondansetron **OR** ondansetron (ZOFRAN) IV, senna-docusate   Vital Signs    Vitals:   07/27/18 0226 07/27/18 0404 07/27/18 0718 07/27/18 0743  BP:  (!) 109/59  (!) 103/55  Pulse:  73  71  Resp:  20  20  Temp:  98.2 F (36.8 C)  98.2 F (36.8 C)  TempSrc:  Oral  Oral  SpO2: 94% 95% 93% 95%  Weight:  73.2 kg    Height:       No intake or output data in the 24 hours ending 07/27/18 0758 Filed Weights   07/26/18 0028 07/26/18 0542 07/27/18 0404  Weight: 74.8 kg 74.8 kg 73.2 kg    Telemetry    SR- Personally Reviewed  ECG  No new tracings- Personally Reviewed  Physical Exam   Per MD  Labs    Chemistry Recent Labs  Lab 07/25/18 1825 07/25/18 2245 07/27/18 0417  NA 141  --  141  K 3.5  --  3.5  CL 107  --  101  CO2 22   --  28  GLUCOSE 122*  --  96  BUN 18  --  29*  CREATININE 0.78  --  1.19*  CALCIUM 9.0  --  9.0  PROT  --  7.5  --   ALBUMIN  --  4.2  --   AST  --  24  --   ALT  --  24  --   ALKPHOS  --  70  --   BILITOT  --  1.0  --   GFRNONAA >60  --  45*  GFRAA >60  --  52*  ANIONGAP 12  --  12     Hematology Recent Labs  Lab 07/25/18 1825  WBC 9.5  RBC 4.21  HGB 12.3  HCT 39.7  MCV 94.3  MCH 29.2  MCHC 31.0  RDW 14.1  PLT 326    Cardiac Enzymes Recent Labs  Lab 07/25/18 1825 07/25/18 2245 07/26/18 0214  TROPONINI <0.03 <0.03 0.03*   No results for input(s): TROPIPOC in the last 168 hours.   BNP Recent Labs  Lab 07/25/18 1825  BNP 1,289.0*     DDimer No results for input(s): DDIMER in the last 168 hours.   Radiology    Dg Chest 2 View  Result Date: 07/25/2018 CLINICAL DATA:  Swelling of the feet and  legs for 3 days. EXAM: CHEST - 2 VIEW COMPARISON:  None. FINDINGS: The patient is status post CABG. Cardiomegaly with aortic atherosclerosis is noted, stable in appearance without aneurysmal dilatation. Diffuse mild interstitial prominence may represent bronchitic change. No alveolar consolidation. No cephalization or effusion to suggest edema. No effusion or pneumothorax. IMPRESSION: Cardiomegaly with mild diffuse increase in interstitial prominence may reflect bronchitic change. Electronically Signed   By: Tollie Eth M.D.   On: 07/25/2018 19:22   Ct Angio Chest Pe W And/or Wo Contrast  Result Date: 07/25/2018 CLINICAL DATA:  73 year old female with lower extremity swelling for 3 days. EXAM: CT ANGIOGRAPHY CHEST WITH CONTRAST TECHNIQUE: Multidetector CT imaging of the chest was performed using the standard protocol during bolus administration of intravenous contrast. Multiplanar CT image reconstructions and MIPs were obtained to evaluate the vascular anatomy. CONTRAST:  29mL OMNIPAQUE IOHEXOL 350 MG/ML SOLN COMPARISON:  Chest radiographs earlier today. FINDINGS:  Cardiovascular: Excellent contrast bolus timing in the pulmonary arterial tree. Mild respiratory motion. No focal filling defect identified in the pulmonary arteries to suggest acute pulmonary embolism. No contrast in the aorta. Calcified aortic atherosclerosis. Calcified coronary artery atherosclerosis and prior CABG. Cardiomegaly. No pericardial effusion. Mediastinum/Nodes: Prior CABG. No mediastinal lymphadenopathy. Asymmetric right axillary lymph nodes are at the upper limits of normal (series 4, image 47). Lungs/Pleura: Major airways are patent. There is suggestion of adherent retained secretions in the trachea on series 6, image 19. Mild generalized pulmonary septal thickening. No pulmonary consolidation. Minor pulmonary ground-glass opacity isolated to the azygoesophageal recess (series 6, image 43). There is mild atelectasis associated with cardiomegaly and in the posterior costophrenic angles. No pleural effusion. Upper Abdomen: Round 2.5 centimeter low-density area in the liver has simple fluid density on series 4, image 95. Otherwise negative visible liver, spleen (occasional calcified granulomas), pancreas, adrenal glands, left kidney, and bowel. Musculoskeletal: Prior sternotomy. No acute osseous abnormality identified. Review of the MIP images confirms the above findings. IMPRESSION: 1. No evidence of acute pulmonary embolus. 2. Generalized pulmonary septal thickening suspicious for mild or developing interstitial edema. No pleural effusion. 3. Cardiomegaly. Prior CABG.  Aortic Atherosclerosis (ICD10-I70.0). Electronically Signed   By: Odessa Fleming M.D.   On: 07/25/2018 21:09    Cardiac Studies   TTE 07/26/2018 1. The left ventricle has severely reduced systolic function, with an ejection fraction < 20%. The cavity size was severely dilated. Global hypokinesis, Concern for severe hypokinesis/possible akinesis of the anterior/anterseptal wall/apical. Unable to  determine diastolic parameters  2. The  right ventricle has mildly reduced systolic function. The cavity was normal. There is no increase in right ventricular wall thickness. Right ventricular systolic pressure is mildly elevated with an estimated pressure of 37.7 mmHg.  3. The aortic valve is grossly normal Moderate calcification of the aortic valve. Aortic valve regurgitation was not assessed by color flow Doppler.  4. Mitral valve regurgitation is moderate  5. Left atrial size was moderately dilated.  TEE 02/22/2012 Study Conclusions - Left ventricle: The cavity size was moderately dilated. Systolic function was severely reduced. The estimated ejection fraction was in the range of 15% to 20%. - Aortic valve: No evidence of vegetation. Trivial regurgitation. - Aorta: The aorta was moderately calcified. - Mitral valve: No evidence of vegetation. Mild to moderate regurgitation directed centrally. - Right ventricle: Systolic function was mildly to moderately reduced. - Tricuspid valve: No evidence of vegetation. Transesophageal echocardiography. 2D and color Doppler. Patient status: Inpatient. Location: Endoscopy.  TEE See scan under cv procedure  Patient Profile     73 y.o. female with a hx of CAD s/p 01/2012 bypass surgery, HFrEF (EF less than 20%)/ ICM, and current smoker, who is being seen today for the evaluation of shortness of breath with known heart failure.  Assessment & Plan    Acute on chronic systolic CHF - SOB and DOE, which are likely multifactorial given low EF <20% with global hypokinesis (concern for akinesis), and pulmonary findings of ground glass on CT (consider COVID-19 rule out) as well as current smoker. Lost to follow-up and medication non-compliance (starting 1 year after CABG), which is likely exacerbating her known HFrEF and ICM. Volume overload with BNP 1289. Desaturation into the 80s. No evidence of acute PE. -  Continue oxygen, breathing treatments. Restarted Coreg 6.25 BID,  lisinopril 10 mg qd. Continue diuresis with Lasix 40 mg IV BID.  Recommend daily BMET with titration of diuresis as renal function allows and for a goal net output of 2L / day until return to baseline weight. Daily I's/O, standing weights.   History of CAD/ICM s/p CABG x2 in 2013, elevated troponin without chest pain - No reported current chest pain. Multiple risk factors with known CAD / CABG in 2013 / current smoker. Medication non-compliance with most recent echo showing global hypokinesis, possible akinesis (echo above). Troponin minimally elevated 0.03 x1, now negative x2.  - Will need further ischemic workup with catheterization. Orders will be placed for [facility] on Monday 3/20. NPO after midnight orders to be placed as well. Further recommendations pending cath. For now, continue 81 mg daily, beta-blocker, atorvastatin 80mg  daily, lisinopril 10mg  daily, and lasix.  LDL 87 and ALT stable. Recommendation for updated liver/renal function in 6-8w, given restarted meds this admission. As outpatient, could consider Entresto if can ensure medication compliance. Consider spironolactone.   Medication noncompliance - Medication compliance encouraged.  Tobacco abuse - Cessation advised  Hypothyroid -- TSH 6.872. Consider PCP follow-up.  COVID-19 Rule Out - SOB and ground glass opacity in setting of severely reduced EF. Consider COVID-19 rule out.  For questions or updates, please contact [facility] Please consult [email or web address] for contact info under        Signed, Lennon Alstrom, PA-C  07/27/2018, 7:58 AM

## 2018-07-27 NOTE — Care Management Important Message (Signed)
Important Message  Patient Details  Name: Dana [name] MRN: [id number] Date of Birth: 04-Nov-1945   Medicare Important Message Given:  Yes    Olegario Messier A Trenda Corliss 07/27/2018, 12:44 PM

## 2018-07-27 NOTE — Progress Notes (Addendum)
[facility] [place] at [facility]     PATIENT NAME: Dana [name]    MR#:  [id number]  DATE OF BIRTH:  1946-04-02  SUBJECTIVE:   Patient here due to her CHF/mild COPD exacerbation.  Still continues to have some cough but overall shortness of breath is improved.  No other acute events overnight.  REVIEW OF SYSTEMS:    Review of Systems  Constitutional: Negative for chills and fever.  HENT: Negative for congestion and tinnitus.   Eyes: Negative for blurred vision and double vision.  Respiratory: Positive for cough and shortness of breath. Negative for wheezing.   Cardiovascular: Positive for orthopnea. Negative for chest pain and PND.  Gastrointestinal: Negative for abdominal pain, diarrhea, nausea and vomiting.  Genitourinary: Negative for dysuria and hematuria.  Skin: Positive for rash.  Neurological: Negative for dizziness, sensory change and focal weakness.  All other systems reviewed and are negative.   Nutrition: Heart Healthy Tolerating Diet: Yes Tolerating PT: Await Eval.       DRUG ALLERGIES:  No Known Allergies  VITALS:  Blood pressure 105/77, pulse 71, temperature 98.2 F (36.8 C), temperature source Oral, resp. rate 20, height 5\' 1"  (1.549 m), weight 73.2 kg, SpO2 95 %.  PHYSICAL EXAMINATION:   Physical Exam  GENERAL:  73 y.o.-year-old patient lying in bed in no acute distress.  EYES: Pupils equal, round, reactive to light and accommodation. No scleral icterus. Extraocular muscles intact.  HEENT: Head atraumatic, normocephalic. Oropharynx and nasopharynx clear.  NECK:  Supple, no jugular venous distention. No thyroid enlargement, no tenderness.  LUNGS: Normal breath sounds bilaterally, no wheezing, bibasilar rales, Occasional rhonchi. No use of accessory muscles of respiration.  CARDIOVASCULAR: S1, S2 normal. No murmurs, rubs, or gallops.  ABDOMEN: Soft, nontender, nondistended. Bowel sounds present. No organomegaly or mass.   EXTREMITIES: No cyanosis, clubbing or edema b/l.    NEUROLOGIC: Cranial nerves II through XII are intact. No focal Motor or sensory deficits b/l.   PSYCHIATRIC: The patient is alert and oriented x 3.  SKIN: Psoriatic plaques on her back and on her elbows and arms bilateral laterally, lesion, or ulcer.    LABORATORY PANEL:   CBC Recent Labs  Lab 07/25/18 1825  WBC 9.5  HGB 12.3  HCT 39.7  PLT 326   ------------------------------------------------------------------------------------------------------------------  Chemistries  Recent Labs  Lab 07/25/18 2245 07/27/18 0417  NA  --  141  K  --  3.5  CL  --  101  CO2  --  28  GLUCOSE  --  96  BUN  --  29*  CREATININE  --  1.19*  CALCIUM  --  9.0  MG 1.7  --   AST 24  --   ALT 24  --   ALKPHOS 70  --   BILITOT 1.0  --    ------------------------------------------------------------------------------------------------------------------  Cardiac Enzymes Recent Labs  Lab 07/26/18 0214  TROPONINI 0.03*   ------------------------------------------------------------------------------------------------------------------  RADIOLOGY:  Dg Chest 2 View  Result Date: 07/25/2018 CLINICAL DATA:  Swelling of the feet and legs for 3 days. EXAM: CHEST - 2 VIEW COMPARISON:  None. FINDINGS: The patient is status post CABG. Cardiomegaly with aortic atherosclerosis is noted, stable in appearance without aneurysmal dilatation. Diffuse mild interstitial prominence may represent bronchitic change. No alveolar consolidation. No cephalization or effusion to suggest edema. No effusion or pneumothorax. IMPRESSION: Cardiomegaly with mild diffuse increase in interstitial prominence may reflect bronchitic change. Electronically Signed   By: Tollie Eth M.D.   On:  07/25/2018 19:22   Ct Angio Chest Pe W And/or Wo Contrast  Result Date: 07/25/2018 CLINICAL DATA:  73 year old female with lower extremity swelling for 3 days. EXAM: CT ANGIOGRAPHY CHEST  WITH CONTRAST TECHNIQUE: Multidetector CT imaging of the chest was performed using the standard protocol during bolus administration of intravenous contrast. Multiplanar CT image reconstructions and MIPs were obtained to evaluate the vascular anatomy. CONTRAST:  32mL OMNIPAQUE IOHEXOL 350 MG/ML SOLN COMPARISON:  Chest radiographs earlier today. FINDINGS: Cardiovascular: Excellent contrast bolus timing in the pulmonary arterial tree. Mild respiratory motion. No focal filling defect identified in the pulmonary arteries to suggest acute pulmonary embolism. No contrast in the aorta. Calcified aortic atherosclerosis. Calcified coronary artery atherosclerosis and prior CABG. Cardiomegaly. No pericardial effusion. Mediastinum/Nodes: Prior CABG. No mediastinal lymphadenopathy. Asymmetric right axillary lymph nodes are at the upper limits of normal (series 4, image 47). Lungs/Pleura: Major airways are patent. There is suggestion of adherent retained secretions in the trachea on series 6, image 19. Mild generalized pulmonary septal thickening. No pulmonary consolidation. Minor pulmonary ground-glass opacity isolated to the azygoesophageal recess (series 6, image 43). There is mild atelectasis associated with cardiomegaly and in the posterior costophrenic angles. No pleural effusion. Upper Abdomen: Round 2.5 centimeter low-density area in the liver has simple fluid density on series 4, image 95. Otherwise negative visible liver, spleen (occasional calcified granulomas), pancreas, adrenal glands, left kidney, and bowel. Musculoskeletal: Prior sternotomy. No acute osseous abnormality identified. Review of the MIP images confirms the above findings. IMPRESSION: 1. No evidence of acute pulmonary embolus. 2. Generalized pulmonary septal thickening suspicious for mild or developing interstitial edema. No pleural effusion. 3. Cardiomegaly. Prior CABG.  Aortic Atherosclerosis (ICD10-I70.0). Electronically Signed   By: Odessa Fleming M.D.    On: 07/25/2018 21:09     ASSESSMENT AND PLAN:   73 year old female with past medical history of hypertension, tobacco abuse, hyperlipidemia COPD who presented to the hospital due to shortness of breath and cough.  1.  Acute respiratory failure with hypoxia-secondary to underlying CHF and also COPD. -Continue O2 supplementation, continue gentle diuresis.  Patient is improving.  Continue scheduled duo nebs, Pulmicort nebs.  2.  CHF- suspected to be acute on chronic systolic CHF.  Patient's previous EF was 15 to 20% and she has not followed up with a physician for years. Patient's echocardiogram showing severe dilated cardiomyopathy with EF of 15 to 20%.  Discussed with cardiology and they plan on reinitiating ischemic work-up and for cardiac catheterization on Monday. -Continue gentle diuresis with IV Lasix and follow renal function closely.  Follow I's and O's and daily weights.  Continue carvedilol, hold ACE inhibitor for now due to worsening renal function.   3.  Dilated cardiomyopathy with history of coronary disease status post bypass- cardiology plans on doing right and left heart catheterization on Monday.  Continue medical management as mentioned above.  4.  COPD- mild acute exacerbation.  No need for IV steroids.   - cont. Duonebs, pulmicort nebs.   5.  Essential hypertension-continue carvedilol hold Lisinopril.   6.  Psoriasis-patient had extensive rash on her back and her elbows and arms.  Will start the patient on some topical steroids with clobetasol.  Explained to the patient that she needs to follow-up with dermatology as an outpatient.   All the records are reviewed and case discussed with Care Management/Social Worker. Management plans discussed with the patient, family and they are in agreement.  CODE STATUS: Full code  DVT Prophylaxis: Lovenox  TOTAL  TIME TAKING CARE OF THIS PATIENT: 35 minutes.   POSSIBLE D/C IN 3-4 DAYS, DEPENDING ON CLINICAL CONDITION.   Houston Siren M.D on 07/27/2018 at 3:08 PM  Between 7am to 6pm - Pager - [phone]  After 6pm go to [email or web address] - Social research officer, government  [facility] [place] [facility]  Office  [phone]  CC: Primary care physician; Patient, No Pcp Per

## 2018-07-28 DIAGNOSIS — F17200 Nicotine dependence, unspecified, uncomplicated: Secondary | ICD-10-CM

## 2018-07-28 LAB — CBC
HCT: 39.6 % (ref 36.0–46.0)
Hemoglobin: 12.4 g/dL (ref 12.0–15.0)
MCH: 29.2 pg (ref 26.0–34.0)
MCHC: 31.3 g/dL (ref 30.0–36.0)
MCV: 93.4 fL (ref 80.0–100.0)
Platelets: 318 10*3/uL (ref 150–400)
RBC: 4.24 MIL/uL (ref 3.87–5.11)
RDW: 14 % (ref 11.5–15.5)
WBC: 8.4 10*3/uL (ref 4.0–10.5)
nRBC: 0 % (ref 0.0–0.2)

## 2018-07-28 LAB — BASIC METABOLIC PANEL
Anion gap: 12 (ref 5–15)
BUN: 28 mg/dL — AB (ref 8–23)
CO2: 30 mmol/L (ref 22–32)
Calcium: 8.9 mg/dL (ref 8.9–10.3)
Chloride: 101 mmol/L (ref 98–111)
Creatinine, Ser: 0.95 mg/dL (ref 0.44–1.00)
GFR calc Af Amer: >60 mL/min (ref >60)
GFR calc non Af Amer: 59 mL/min — ABNORMAL LOW (ref >60)
Glucose, Bld: 88 mg/dL (ref 70–99)
Potassium: 3.8 mmol/L (ref 3.5–5.1)
Sodium: 143 mmol/L (ref 135–145)

## 2018-07-28 MED ORDER — LOSARTAN POTASSIUM 25 MG PO TABS
25.0000 mg | ORAL_TABLET | Freq: Every day | ORAL | Status: DC
  Administered 2018-07-28 – 2018-07-30 (×3): 25 mg via ORAL
  Filled 2018-07-28 (×3): qty 1

## 2018-07-28 MED ORDER — ENOXAPARIN SODIUM 40 MG/0.4ML ~~LOC~~ SOLN
40.0000 mg | SUBCUTANEOUS | Status: DC
  Administered 2018-07-28 – 2018-07-29 (×2): 40 mg via SUBCUTANEOUS
  Filled 2018-07-28 (×2): qty 0.4

## 2018-07-28 MED ORDER — FUROSEMIDE 10 MG/ML IJ SOLN
40.0000 mg | Freq: Two times a day (BID) | INTRAMUSCULAR | Status: DC
  Administered 2018-07-28 – 2018-07-29 (×2): 40 mg via INTRAVENOUS
  Filled 2018-07-28 (×2): qty 4

## 2018-07-28 MED ORDER — SODIUM CHLORIDE 0.9% FLUSH
3.0000 mL | Freq: Two times a day (BID) | INTRAVENOUS | Status: DC
  Administered 2018-07-28 – 2018-07-29 (×4): 3 mL via INTRAVENOUS

## 2018-07-28 NOTE — Progress Notes (Signed)
[facility] - [place] at [facility]     PATIENT NAME: Dana [name]    MR#:  [id number]  DATE OF BIRTH:  November 23, 1945  SUBJECTIVE:   Patient here due to her CHF/mild COPD exacerbation.   Out of bed to chair resting comfortably Still continues to have intermittent episodes of cough but overall shortness of breath is improved.   Rare short runs of nonsustained V. tach on telemetry  REVIEW OF SYSTEMS:    Review of Systems  Constitutional: Negative for chills and fever.  HENT: Negative for congestion and tinnitus.   Eyes: Negative for blurred vision and double vision.  Respiratory: Positive for cough and shortness of breath. Negative for wheezing.   Cardiovascular: Positive for orthopnea. Negative for chest pain and PND.  Gastrointestinal: Negative for abdominal pain, diarrhea, nausea and vomiting.  Genitourinary: Negative for dysuria and hematuria.  Skin: Positive for rash.  Neurological: Negative for dizziness, sensory change and focal weakness.  All other systems reviewed and are negative.   Nutrition: Heart Healthy Tolerating Diet: Yes Tolerating PT: Await Eval.       DRUG ALLERGIES:  No Known Allergies  VITALS:  Blood pressure 118/65, pulse 62, temperature 98.6 F (37 C), temperature source Oral, resp. rate 20, height 5\' 1"  (1.549 m), weight 72.3 kg, SpO2 95 %.  PHYSICAL EXAMINATION:   Physical Exam  GENERAL:  73 y.o.-year-old patient lying in bed in no acute distress.  EYES: Pupils equal, round, reactive to light and accommodation. No scleral icterus. Extraocular muscles intact.  HEENT: Head atraumatic, normocephalic. Oropharynx and nasopharynx clear.  NECK:  Supple, no jugular venous distention. No thyroid enlargement, no tenderness.  LUNGS: Normal breath sounds bilaterally, no wheezing, bibasilar rales, Occasional rhonchi. No use of accessory muscles of respiration.  CARDIOVASCULAR: S1, S2 normal. No murmurs, rubs, or gallops.  ABDOMEN:  Soft, nontender, nondistended. Bowel sounds present. No organomegaly or mass.  EXTREMITIES: No cyanosis, clubbing or edema b/l.    NEUROLOGIC: Cranial nerves II through XII are intact. No focal Motor or sensory deficits b/l.   PSYCHIATRIC: The patient is alert and oriented x 3.  SKIN: Psoriatic plaques on her back and on her elbows and arms bilateral laterally, lesion, or ulcer.    LABORATORY PANEL:   CBC Recent Labs  Lab 07/28/18 0420  WBC 8.4  HGB 12.4  HCT 39.6  PLT 318   ------------------------------------------------------------------------------------------------------------------  Chemistries  Recent Labs  Lab 07/25/18 2245  07/28/18 0420  NA  --    < > 143  K  --    < > 3.8  CL  --    < > 101  CO2  --    < > 30  GLUCOSE  --    < > 88  BUN  --    < > 28*  CREATININE  --    < > 0.95  CALCIUM  --    < > 8.9  MG 1.7  --   --   AST 24  --   --   ALT 24  --   --   ALKPHOS 70  --   --   BILITOT 1.0  --   --    < > = values in this interval not displayed.   ------------------------------------------------------------------------------------------------------------------  Cardiac Enzymes Recent Labs  Lab 07/26/18 0214  TROPONINI 0.03*   ------------------------------------------------------------------------------------------------------------------  RADIOLOGY:  No results found.   ASSESSMENT AND PLAN:   73 year old female with past medical history of hypertension, tobacco abuse,  hyperlipidemia COPD who presented to the hospital due to shortness of breath and cough.  1.  Acute respiratory failure with hypoxia-secondary to underlying acute on chronic systolic CHF and also COPD. -Continue O2 supplementation -1 dose of 40 mg of Lasix IV given yesterday we will continue 40 mg IV twice daily and closely monitor renal function  -Continue scheduled duo nebs, Pulmicort nebs. -Losartan 25 mg is added to the regimen continue Coreg  2.  CHF- suspected to be acute  on chronic systolic CHF.   Patient's previous EF was 15 to 20% and she has not followed up with a physician for years. Patient's echocardiogram showing severe dilated cardiomyopathy with EF of 15 to 20%.   Discussed with cardiology and they plan on reinitiating ischemic work-up and for cardiac catheterization on Monday. -Continue gentle diuresis with IV Lasix and follow renal function closely.   Follow I's and O's and daily weights.   Continue carvedilol, 25 mg of low-dose losartan added to the regimen by cardiology  3.  Dilated cardiomyopathy with history of coronary disease status post bypass- cardiology plans on doing right and left heart catheterization on Monday.  Continue medical management as mentioned above.  4.  COPD- mild acute exacerbation.  No need for IV steroids.   - cont. Duonebs, pulmicort nebs.   5.  Essential hypertension-continue carvedilol hold Lisinopril.   6.  Psoriasis-patient had extensive rash on her back and her elbows and arms.   topical steroids with clobetasol.  Explained to the patient that she needs to follow-up with dermatology as an outpatient.   All the records are reviewed and case discussed with Care Management/Social Worker. Management plans discussed with the patient, she  in agreement.  CODE STATUS: Full code  DVT Prophylaxis: Lovenox  TOTAL TIME TAKING CARE OF THIS PATIENT: 35 minutes.   POSSIBLE D/C IN 2-3 DAYS, DEPENDING ON CLINICAL CONDITION.   Ramonita Lab M.D on 07/28/2018 at 1:23 PM  Between 7am to 6pm - Pager - [phone]  After 6pm go to [email or web address] - Social research officer, government  [facility] [place] [facility]  Office  [phone]  CC: Primary care physician; Patient, No Pcp Per

## 2018-07-28 NOTE — Progress Notes (Signed)
Progress Note  Patient Name: Dana [name] Date of Encounter: 07/28/2018  Primary Cardiologist: Julien Nordmann, MD   Subjective   Denies any significant chest pain Has some chest congestion, has not been ambulating Not on oxygen Telemetry monitor showing desaturations with sats in the high 80s.  Ortho she is asymptomatic Rare short runs of nonsustained VT   Inpatient Medications    Scheduled Meds: . aspirin  81 mg Oral Daily  . atorvastatin  80 mg Oral q1800  . budesonide (PULMICORT) nebulizer solution  0.5 mg Nebulization BID  . carvedilol  6.25 mg Oral BID WC  . clobetasol cream   Topical BID  . enoxaparin (LOVENOX) injection  40 mg Subcutaneous Q24H  . feeding supplement (ENSURE ENLIVE)  237 mL Oral Q24H  . furosemide  40 mg Intravenous BID  . ipratropium-albuterol  3 mL Nebulization Q6H  . losartan  25 mg Oral Daily  . sodium chloride flush  3 mL Intravenous Q12H   Continuous Infusions:  PRN Meds: acetaminophen **OR** acetaminophen, bisacodyl, guaiFENesin-dextromethorphan, ondansetron **OR** ondansetron (ZOFRAN) IV, senna-docusate   Vital Signs    Vitals:   07/28/18 0411 07/28/18 0414 07/28/18 0751 07/28/18 0813  BP: (!) 93/55 (!) 118/57  118/65  Pulse: (!) 57 69 60 62  Resp: 18  18 20   Temp: 97.9 F (36.6 C)   98.6 F (37 C)  TempSrc: Oral   Oral  SpO2: 91%  91% 95%  Weight:      Height:        Intake/Output Summary (Last 24 hours) at 07/28/2018 1202 Last data filed at 07/28/2018 1009 Gross per 24 hour  Intake 243 ml  Output 200 ml  Net 43 ml   Filed Weights   07/26/18 0542 07/27/18 0404 07/28/18 0411  Weight: 74.8 kg 73.2 kg 72.3 kg    Telemetry    SR- Personally Reviewed  ECG  No new tracings- Personally Reviewed  Physical Exam   Constitutional:  oriented to person, place, and time. No distress.  HENT:  Head: Grossly normal Eyes:  no discharge. No scleral icterus.  Neck: No JVD, no carotid bruits  Cardiovascular: Regular rate  and rhythm, no murmurs appreciated Pulmonary/Chest: Wheezing, Rales throughout, moderately decreased breath sounds Abdominal: Soft.  no distension.  no tenderness.  Musculoskeletal: Normal range of motion Neurological:  normal muscle tone. Coordination normal. No atrophy Skin: Skin warm and dry Psychiatric: normal affect, pleasant   Labs    Chemistry Recent Labs  Lab 07/25/18 1825 07/25/18 2245 07/27/18 0417 07/28/18 0420  NA 141  --  141 143  K 3.5  --  3.5 3.8  CL 107  --  101 101  CO2 22  --  28 30  GLUCOSE 122*  --  96 88  BUN 18  --  29* 28*  CREATININE 0.78  --  1.19* 0.95  CALCIUM 9.0  --  9.0 8.9  PROT  --  7.5  --   --   ALBUMIN  --  4.2  --   --   AST  --  24  --   --   ALT  --  24  --   --   ALKPHOS  --  70  --   --   BILITOT  --  1.0  --   --   GFRNONAA >60  --  45* 59*  GFRAA >60  --  52* >60  ANIONGAP 12  --  12 12     Hematology  Recent Labs  Lab 07/25/18 1825 07/28/18 0420  WBC 9.5 8.4  RBC 4.21 4.24  HGB 12.3 12.4  HCT 39.7 39.6  MCV 94.3 93.4  MCH 29.2 29.2  MCHC 31.0 31.3  RDW 14.1 14.0  PLT 326 318    Cardiac Enzymes Recent Labs  Lab 07/25/18 1825 07/25/18 2245 07/26/18 0214  TROPONINI <0.03 <0.03 0.03*   No results for input(s): TROPIPOC in the last 168 hours.   BNP Recent Labs  Lab 07/25/18 1825  BNP 1,289.0*     DDimer No results for input(s): DDIMER in the last 168 hours.   Radiology    No results found.  Cardiac Studies   TTE 07/26/2018 1. The left ventricle has severely reduced systolic function, with an ejection fraction < 20%. The cavity size was severely dilated. Global hypokinesis, Concern for severe hypokinesis/possible akinesis of the anterior/anterseptal wall/apical. Unable to  determine diastolic parameters  2. The right ventricle has mildly reduced systolic function. The cavity was normal. There is no increase in right ventricular wall thickness. Right ventricular systolic pressure is mildly elevated  with an estimated pressure of 37.7 mmHg.  3. The aortic valve is grossly normal Moderate calcification of the aortic valve. Aortic valve regurgitation was not assessed by color flow Doppler.  4. Mitral valve regurgitation is moderate  5. Left atrial size was moderately dilated.  TEE 02/22/2012 Study Conclusions - Left ventricle: The cavity size was moderately dilated. Systolic function was severely reduced. The estimated ejection fraction was in the range of 15% to 20%. - Aortic valve: No evidence of vegetation. Trivial regurgitation. - Aorta: The aorta was moderately calcified. - Mitral valve: No evidence of vegetation. Mild to moderate regurgitation directed centrally. - Right ventricle: Systolic function was mildly to moderately reduced. - Tricuspid valve: No evidence of vegetation. Transesophageal echocardiography. 2D and color Doppler. Patient status: Inpatient. Location: Endoscopy.   Patient Profile     73 y.o. female with a hx of CAD s/p 01/2012 bypass surgery, HFrEF (EF less than 20%)/ ICM, and current smoker, who is being seen today for the evaluation of shortness of breath with known heart failure.  Assessment & Plan    Acute on chronic systolic CHF Severely reduced ejection fraction 20% on last on echocardiogram Suspected ischemic cardiomyopathy given history of coronary disease and bypass surgery 8 years ago -Single dose of Lasix 40 IV given yesterday out of concern for renal function Renal function stable, will increase back to 40 IV twice daily We will add low-dose losartan 25 daily, continue Coreg  History of CAD/ICM s/p CABG x2 in 2013, elevated troponin without chest pain History of coronary disease, bypass surgery 2013 Long history of smoking, medication noncompliance in the past 7 to 8 years/lost to follow-up -Given severely reduced ejection fraction, symptomatic systolic heart failure symptoms, we have scheduled her for right and left heart  catheterization with grafts on Monday morning  Medication noncompliance Stressed importance of following up with cardiology Reports that she felt well and has not followed up with cardiology in 7 years Now with decompensated heart failure  Tobacco abuse - Cessation advised  Hypothyroid -- TSH 6.872.  Outpatient management  COPD Long history of smoking Discussed with nursing, may need to monitor for hypoxia especially with ambulation Numbers may improve with diuresis If still hypoxic on discharge may need home oxygen   Total encounter time more than 25 minutes  Greater than 50% was spent in counseling and coordination of care with the patient  For questions or updates, please contact [facility] Please consult [email or web address] for contact info under        Signed, Julien Nordmann, MD  07/28/2018, 12:02 PM

## 2018-07-28 NOTE — Plan of Care (Signed)
  Problem: Education: Goal: Ability to demonstrate management of disease process will improve Outcome: Progressing Goal: Ability to verbalize understanding of medication therapies will improve Outcome: Progressing Goal: Individualized Educational Video(s) Outcome: Progressing   Problem: Cardiac: Goal: Ability to achieve and maintain adequate cardiopulmonary perfusion will improve Outcome: Progressing Note:  No arrhythmias overnight   Problem: Clinical Measurements: Goal: Will remain free from infection Outcome: Progressing Note:  Remains afebrile Goal: Diagnostic test results will improve Outcome: Progressing   Problem: Clinical Measurements: Goal: Respiratory complications will improve Outcome: Not Progressing Note:  Continues to have bil exp wheezes

## 2018-07-29 LAB — BASIC METABOLIC PANEL
Anion gap: 10 (ref 5–15)
BUN: 30 mg/dL — ABNORMAL HIGH (ref 8–23)
CO2: 30 mmol/L (ref 22–32)
Calcium: 8.9 mg/dL (ref 8.9–10.3)
Chloride: 99 mmol/L (ref 98–111)
Creatinine, Ser: 0.84 mg/dL (ref 0.44–1.00)
GFR calc Af Amer: >60 mL/min (ref >60)
GFR calc non Af Amer: >60 mL/min (ref >60)
GLUCOSE: 92 mg/dL (ref 70–99)
Potassium: 3.7 mmol/L (ref 3.5–5.1)
Sodium: 139 mmol/L (ref 135–145)

## 2018-07-29 MED ORDER — FUROSEMIDE 10 MG/ML IJ SOLN: 40.0000 mg | Freq: Every day | INTRAMUSCULAR | Status: DC

## 2018-07-29 MED ORDER — POTASSIUM CHLORIDE 20 MEQ PO PACK
40.0000 meq | PACK | Freq: Once | ORAL | Status: AC
  Administered 2018-07-29: 40 meq via ORAL
  Filled 2018-07-29: qty 2

## 2018-07-29 MED ORDER — POTASSIUM CHLORIDE CRYS ER 10 MEQ PO TBCR
EXTENDED_RELEASE_TABLET | ORAL | Status: AC
  Filled 2018-07-29: qty 4

## 2018-07-29 MED ORDER — SODIUM CHLORIDE 0.9 % IV SOLN: 250.0000 mL | INTRAVENOUS | Status: DC | PRN

## 2018-07-29 MED ORDER — SODIUM CHLORIDE 0.9 % WEIGHT BASED INFUSION: 1.0000 mL/kg/h | INTRAVENOUS | Status: DC

## 2018-07-29 MED ORDER — SODIUM CHLORIDE 0.9% FLUSH: 3.0000 mL | INTRAVENOUS | Status: DC | PRN

## 2018-07-29 MED ORDER — SODIUM CHLORIDE 0.9 % WEIGHT BASED INFUSION
3.0000 mL/kg/h | INTRAVENOUS | Status: DC
  Administered 2018-07-30: 3 mL/kg/h via INTRAVENOUS

## 2018-07-29 MED ORDER — CARVEDILOL 3.125 MG PO TABS
3.1250 mg | ORAL_TABLET | Freq: Two times a day (BID) | ORAL | Status: DC
  Administered 2018-07-29: 3.125 mg via ORAL
  Filled 2018-07-29: qty 1

## 2018-07-29 MED ORDER — SODIUM CHLORIDE 0.9% FLUSH: 3.0000 mL | Freq: Two times a day (BID) | INTRAVENOUS | Status: DC

## 2018-07-29 NOTE — Plan of Care (Signed)
  Problem: Education: Goal: Ability to demonstrate management of disease process will improve Outcome: Progressing   Problem: Activity: Goal: Capacity to carry out activities will improve Outcome: Progressing   Problem: Education: Goal: Knowledge of General Education information will improve Description Including pain rating scale, medication(s)/side effects and non-pharmacologic comfort measures Outcome: Progressing   Problem: Pain Managment: Goal: General experience of comfort will improve Outcome: Progressing   Problem: Education: Goal: Ability to demonstrate management of disease process will improve Outcome: Progressing   Problem: Activity: Goal: Capacity to carry out activities will improve Outcome: Progressing   Problem: Education: Goal: Knowledge of General Education information will improve Description Including pain rating scale, medication(s)/side effects and non-pharmacologic comfort measures Outcome: Progressing   Problem: Pain Managment: Goal: General experience of comfort will improve Outcome: Progressing

## 2018-07-29 NOTE — H&P (View-Only) (Signed)
Progress Note  Patient Name: Dana [name] Date of Encounter: 07/29/2018  Primary Cardiologist: Julien Nordmann, MD   Subjective   Improved SOB, less tight in her chest Moderate improvement,  Also ABD less tight Rare short runs of nonsustained VT, no sx Received lasix IV BID yesterday   Inpatient Medications    Scheduled Meds: . aspirin  81 mg Oral Daily  . atorvastatin  80 mg Oral q1800  . budesonide (PULMICORT) nebulizer solution  0.5 mg Nebulization BID  . carvedilol  3.125 mg Oral BID WC  . clobetasol cream   Topical BID  . enoxaparin (LOVENOX) injection  40 mg Subcutaneous Q24H  . feeding supplement (ENSURE ENLIVE)  237 mL Oral Q24H  . furosemide  40 mg Intravenous BID  . ipratropium-albuterol  3 mL Nebulization Q6H  . losartan  25 mg Oral Daily  . sodium chloride flush  3 mL Intravenous Q12H   Continuous Infusions:  PRN Meds: acetaminophen **OR** acetaminophen, bisacodyl, guaiFENesin-dextromethorphan, ondansetron **OR** ondansetron (ZOFRAN) IV, senna-docusate   Vital Signs    Vitals:   07/29/18 0643 07/29/18 0738 07/29/18 0838 07/29/18 0958  BP: 121/61  (!) 111/54 103/70  Pulse: 75  69 76  Resp: 18  18   Temp: 98.4 F (36.9 C)  98.1 F (36.7 C)   TempSrc: Oral  Oral   SpO2: 93% 92% 90% 91%  Weight:      Height:        Intake/Output Summary (Last 24 hours) at 07/29/2018 1419 Last data filed at 07/29/2018 1300 Gross per 24 hour  Intake 480 ml  Output 200 ml  Net 280 ml   Filed Weights   07/27/18 0404 07/28/18 0411 07/29/18 0500  Weight: 73.2 kg 72.3 kg 71.6 kg    Telemetry    SR- Personally Reviewed  ECG  No new tracings- Personally Reviewed  Physical Exam  Constitutional:  oriented to person, place, and time. No distress.  HENT:  Head: Normocephalic and atraumatic.  Eyes:  no discharge. No scleral icterus.  Neck: Normal range of motion. Neck supple.  JVD 8+ present.  Cardiovascular: Normal rate, regular rhythm, normal heart sounds  and intact distal pulses. Exam reveals no gallop and no friction rub. No edema No murmur heard. Pulmonary/Chest: scattered rales, coarse Abdominal: Soft.  no distension.  no tenderness.  Musculoskeletal: Normal range of motion.  no  tenderness or deformity.  Neurological:  normal muscle tone. Coordination normal. No atrophy Skin: Skin is warm and dry. No rash noted. not diaphoretic.  Psychiatric:  normal mood and affect. behavior is normal. Thought content normal.      Labs    Chemistry Recent Labs  Lab 07/25/18 2245 07/27/18 0417 07/28/18 0420 07/29/18 0432  NA  --  141 143 139  K  --  3.5 3.8 3.7  CL  --  101 101 99  CO2  --  28 30 30   GLUCOSE  --  96 88 92  BUN  --  29* 28* 30*  CREATININE  --  1.19* 0.95 0.84  CALCIUM  --  9.0 8.9 8.9  PROT 7.5  --   --   --   ALBUMIN 4.2  --   --   --   AST 24  --   --   --   ALT 24  --   --   --   ALKPHOS 70  --   --   --   BILITOT 1.0  --   --   --  GFRNONAA  --  45* 59* >60  GFRAA  --  52* >60 >60  ANIONGAP  --  12 12 10      Hematology Recent Labs  Lab 07/25/18 1825 07/28/18 0420  WBC 9.5 8.4  RBC 4.21 4.24  HGB 12.3 12.4  HCT 39.7 39.6  MCV 94.3 93.4  MCH 29.2 29.2  MCHC 31.0 31.3  RDW 14.1 14.0  PLT 326 318    Cardiac Enzymes Recent Labs  Lab 07/25/18 1825 07/25/18 2245 07/26/18 0214  TROPONINI <0.03 <0.03 0.03*   No results for input(s): TROPIPOC in the last 168 hours.   BNP Recent Labs  Lab 07/25/18 1825  BNP 1,289.0*     DDimer No results for input(s): DDIMER in the last 168 hours.   Radiology    No results found.  Cardiac Studies   TTE 07/26/2018 1. The left ventricle has severely reduced systolic function, with an ejection fraction < 20%. The cavity size was severely dilated. Global hypokinesis, Concern for severe hypokinesis/possible akinesis of the anterior/anterseptal wall/apical. Unable to  determine diastolic parameters  2. The right ventricle has mildly reduced systolic function.  The cavity was normal. There is no increase in right ventricular wall thickness. Right ventricular systolic pressure is mildly elevated with an estimated pressure of 37.7 mmHg.  3. The aortic valve is grossly normal Moderate calcification of the aortic valve. Aortic valve regurgitation was not assessed by color flow Doppler.  4. Mitral valve regurgitation is moderate  5. Left atrial size was moderately dilated.  TEE 02/22/2012 Study Conclusions - Left ventricle: The cavity size was moderately dilated. Systolic function was severely reduced. The estimated ejection fraction was in the range of 15% to 20%. - Aortic valve: No evidence of vegetation. Trivial regurgitation. - Aorta: The aorta was moderately calcified. - Mitral valve: No evidence of vegetation. Mild to moderate regurgitation directed centrally. - Right ventricle: Systolic function was mildly to moderately reduced. - Tricuspid valve: No evidence of vegetation. Transesophageal echocardiography. 2D and color Doppler. Patient status: Inpatient. Location: Endoscopy.   Patient Profile     73 y.o. female with a hx of CAD s/p 01/2012 bypass surgery, HFrEF (EF less than 20%)/ ICM, and current smoker, who is being seen today for the evaluation of shortness of breath with known heart failure.  Assessment & Plan    Acute on chronic systolic CHF Severely reduced ejection fraction 20% on echocardiogram Suspected ischemic cardiomyopathy given history of coronary disease and bypass surgery 8 years ago -will decrease coreg to 3.125 mg BID given los BP, continue losartan 25 daily Lasix down to 40 IV x 1 today, then hold until after cath on monday   History of CAD/ICM s/p CABG x2 in 2013, elevated troponin without chest pain History of coronary disease, bypass surgery 2013 Long history of smoking, medication noncompliance in the past 7 to 8 years/lost to follow-up -Given severely reduced ejection fraction, symptomatic  systolic heart failure symptoms, we have scheduled her for right and left heart catheterization with grafts on Monday morning I have reviewed the risks, indications, and alternatives to cardiac catheterization, possible angioplasty, and stenting with the patient. Risks include but are not limited to bleeding, infection, vascular injury, stroke, myocardial infection, arrhythmia, kidney injury, radiation-related injury in the case of prolonged fluoroscopy use, emergency cardiac surgery, and death. The patient understands the risks of serious complication is 1-2 in 1000 with diagnostic cardiac cath and 1-2% or less with angioplasty/stenting.  --scheduled for Monday AM  Medication noncompliance Stressed  importance of following up with cardiology Reports that she felt well and has not followed up with cardiology in 7 years Now with decompensated heart failure, severely reduced EF, still smoking  Tobacco abuse - Cessation advised Has been without for 5 days so far. Does not want a patch  Hypothyroid -- TSH 6.872.  Outpatient management (has not seen PMD in a long time)  COPD Long history of smoking Would continue to monitor ambulatory sats to see if home oxygen is needed  Dispo: need outpt primary care   Total encounter time more than 25 minutes  Greater than 50% was spent in counseling and coordination of care with the patient   For questions or updates, please contact [facility] Please consult [email or web address] for contact info under        Signed, Julien Nordmann, MD  07/29/2018, 2:19 PM

## 2018-07-29 NOTE — Progress Notes (Signed)
MD notified of low bp 103/70 pulse 76, per Dr. Amado Coe okay to hold carvedilol. Will continue to monitor.

## 2018-07-29 NOTE — Progress Notes (Signed)
 Progress Note  Patient Name: Dana [name] Date of Encounter: 07/29/2018  Primary Cardiologist: Guynell Kleiber, MD   Subjective   Improved SOB, less tight in her chest Moderate improvement,  Also ABD less tight Rare short runs of nonsustained VT, no sx Received lasix IV BID yesterday   Inpatient Medications    Scheduled Meds: . aspirin  81 mg Oral Daily  . atorvastatin  80 mg Oral q1800  . budesonide (PULMICORT) nebulizer solution  0.5 mg Nebulization BID  . carvedilol  3.125 mg Oral BID WC  . clobetasol cream   Topical BID  . enoxaparin (LOVENOX) injection  40 mg Subcutaneous Q24H  . feeding supplement (ENSURE ENLIVE)  237 mL Oral Q24H  . furosemide  40 mg Intravenous BID  . ipratropium-albuterol  3 mL Nebulization Q6H  . losartan  25 mg Oral Daily  . sodium chloride flush  3 mL Intravenous Q12H   Continuous Infusions:  PRN Meds: acetaminophen **OR** acetaminophen, bisacodyl, guaiFENesin-dextromethorphan, ondansetron **OR** ondansetron (ZOFRAN) IV, senna-docusate   Vital Signs    Vitals:   07/29/18 0643 07/29/18 0738 07/29/18 0838 07/29/18 0958  BP: 121/61  (!) 111/54 103/70  Pulse: 75  69 76  Resp: 18  18   Temp: 98.4 F (36.9 C)  98.1 F (36.7 C)   TempSrc: Oral  Oral   SpO2: 93% 92% 90% 91%  Weight:      Height:        Intake/Output Summary (Last 24 hours) at 07/29/2018 1419 Last data filed at 07/29/2018 1300 Gross per 24 hour  Intake 480 ml  Output 200 ml  Net 280 ml   Filed Weights   07/27/18 0404 07/28/18 0411 07/29/18 0500  Weight: 73.2 kg 72.3 kg 71.6 kg    Telemetry    SR- Personally Reviewed  ECG  No new tracings- Personally Reviewed  Physical Exam  Constitutional:  oriented to person, place, and time. No distress.  HENT:  Head: Normocephalic and atraumatic.  Eyes:  no discharge. No scleral icterus.  Neck: Normal range of motion. Neck supple.  JVD 8+ present.  Cardiovascular: Normal rate, regular rhythm, normal heart sounds  and intact distal pulses. Exam reveals no gallop and no friction rub. No edema No murmur heard. Pulmonary/Chest: scattered rales, coarse Abdominal: Soft.  no distension.  no tenderness.  Musculoskeletal: Normal range of motion.  no  tenderness or deformity.  Neurological:  normal muscle tone. Coordination normal. No atrophy Skin: Skin is warm and dry. No rash noted. not diaphoretic.  Psychiatric:  normal mood and affect. behavior is normal. Thought content normal.      Labs    Chemistry Recent Labs  Lab 07/25/18 2245 07/27/18 0417 07/28/18 0420 07/29/18 0432  NA  --  141 143 139  K  --  3.5 3.8 3.7  CL  --  101 101 99  CO2  --  28 30 30  GLUCOSE  --  96 88 92  BUN  --  29* 28* 30*  CREATININE  --  1.19* 0.95 0.84  CALCIUM  --  9.0 8.9 8.9  PROT 7.5  --   --   --   ALBUMIN 4.2  --   --   --   AST 24  --   --   --   ALT 24  --   --   --   ALKPHOS 70  --   --   --   BILITOT 1.0  --   --   --     GFRNONAA  --  45* 59* >60  GFRAA  --  52* >60 >60  ANIONGAP  --  12 12 10      Hematology Recent Labs  Lab 07/25/18 1825 07/28/18 0420  WBC 9.5 8.4  RBC 4.21 4.24  HGB 12.3 12.4  HCT 39.7 39.6  MCV 94.3 93.4  MCH 29.2 29.2  MCHC 31.0 31.3  RDW 14.1 14.0  PLT 326 318    Cardiac Enzymes Recent Labs  Lab 07/25/18 1825 07/25/18 2245 07/26/18 0214  TROPONINI <0.03 <0.03 0.03*   No results for input(s): TROPIPOC in the last 168 hours.   BNP Recent Labs  Lab 07/25/18 1825  BNP 1,289.0*     DDimer No results for input(s): DDIMER in the last 168 hours.   Radiology    No results found.  Cardiac Studies   TTE 07/26/2018 1. The left ventricle has severely reduced systolic function, with an ejection fraction < 20%. The cavity size was severely dilated. Global hypokinesis, Concern for severe hypokinesis/possible akinesis of the anterior/anterseptal wall/apical. Unable to  determine diastolic parameters  2. The right ventricle has mildly reduced systolic function.  The cavity was normal. There is no increase in right ventricular wall thickness. Right ventricular systolic pressure is mildly elevated with an estimated pressure of 37.7 mmHg.  3. The aortic valve is grossly normal Moderate calcification of the aortic valve. Aortic valve regurgitation was not assessed by color flow Doppler.  4. Mitral valve regurgitation is moderate  5. Left atrial size was moderately dilated.  TEE 02/22/2012 Study Conclusions - Left ventricle: The cavity size was moderately dilated. Systolic function was severely reduced. The estimated ejection fraction was in the range of 15% to 20%. - Aortic valve: No evidence of vegetation. Trivial regurgitation. - Aorta: The aorta was moderately calcified. - Mitral valve: No evidence of vegetation. Mild to moderate regurgitation directed centrally. - Right ventricle: Systolic function was mildly to moderately reduced. - Tricuspid valve: No evidence of vegetation. Transesophageal echocardiography. 2D and color Doppler. Patient status: Inpatient. Location: Endoscopy.   Patient Profile     73 y.o. female with a hx of CAD s/p 01/2012 bypass surgery, HFrEF (EF less than 20%)/ ICM, and current smoker, who is being seen today for the evaluation of shortness of breath with known heart failure.  Assessment & Plan    Acute on chronic systolic CHF Severely reduced ejection fraction 20% on echocardiogram Suspected ischemic cardiomyopathy given history of coronary disease and bypass surgery 8 years ago -will decrease coreg to 3.125 mg BID given los BP, continue losartan 25 daily Lasix down to 40 IV x 1 today, then hold until after cath on monday   History of CAD/ICM s/p CABG x2 in 2013, elevated troponin without chest pain History of coronary disease, bypass surgery 2013 Long history of smoking, medication noncompliance in the past 7 to 8 years/lost to follow-up -Given severely reduced ejection fraction, symptomatic  systolic heart failure symptoms, we have scheduled her for right and left heart catheterization with grafts on Monday morning I have reviewed the risks, indications, and alternatives to cardiac catheterization, possible angioplasty, and stenting with the patient. Risks include but are not limited to bleeding, infection, vascular injury, stroke, myocardial infection, arrhythmia, kidney injury, radiation-related injury in the case of prolonged fluoroscopy use, emergency cardiac surgery, and death. The patient understands the risks of serious complication is 1-2 in 1000 with diagnostic cardiac cath and 1-2% or less with angioplasty/stenting.  --scheduled for Monday AM  Medication noncompliance Stressed  importance of following up with cardiology Reports that she felt well and has not followed up with cardiology in 7 years Now with decompensated heart failure, severely reduced EF, still smoking  Tobacco abuse - Cessation advised Has been without for 5 days so far. Does not want a patch  Hypothyroid -- TSH 6.872.  Outpatient management (has not seen PMD in a long time)  COPD Long history of smoking Would continue to monitor ambulatory sats to see if home oxygen is needed  Dispo: need outpt primary care   Total encounter time more than 25 minutes  Greater than 50% was spent in counseling and coordination of care with the patient   For questions or updates, please contact [facility] Please consult [email or web address] for contact info under        Signed, Julien Nordmann, MD  07/29/2018, 2:19 PM

## 2018-07-29 NOTE — Progress Notes (Addendum)
CCMD called and reported that pt just have 12 beats of non sustain V-tach. VSS and asymptomatic.Talked to Dr. Helene Shoe and ordered to give pt 40 mEq potassium tablet oral once. Notified incoming shift. Will continue to monitor.

## 2018-07-29 NOTE — Progress Notes (Signed)
[facility] [place] [facility]     PATIENT NAME: Dana [name]    MR#:  [id number]  DATE OF BIRTH:  [birth year]  SUBJECTIVE:   Patient here due to her CHF/mild COPD exacerbation.   Denies any chest pain.  Ambulating to bathroom without any difficulty of shortness of breath Still continues to have intermittent episodes of cough but overall shortness of breath is improved.   Rare short runs of nonsustained V. tach on telemetry Scheduled for right and left heart cath in a.m.  REVIEW OF SYSTEMS:    Review of Systems  Constitutional: Negative for chills and fever.  HENT: Negative for congestion and tinnitus.   Eyes: Negative for blurred vision and double vision.  Respiratory: Positive for cough and shortness of breath. Negative for wheezing.   Cardiovascular: Positive for orthopnea. Negative for chest pain and PND.  Gastrointestinal: Negative for abdominal pain, diarrhea, nausea and vomiting.  Genitourinary: Negative for dysuria and hematuria.  Skin: Positive for rash.  Neurological: Negative for dizziness, sensory change and focal weakness.  All other systems reviewed and are negative.   Nutrition: Heart Healthy Tolerating Diet: Yes Tolerating PT: Await Eval.       DRUG ALLERGIES:  No Known Allergies  VITALS:  Blood pressure 121/60, pulse 73, temperature 98.1 F (36.7 C), temperature source Oral, resp. rate 18, height 5\' 1"  (1.549 m), weight 71.6 kg, SpO2 90 %.  PHYSICAL EXAMINATION:   Physical Exam  GENERAL:  73 y.o.-year-old patient lying in bed in no acute distress.  EYES: Pupils equal, round, reactive to light and accommodation. No scleral icterus. Extraocular muscles intact.  HEENT: Head atraumatic, normocephalic. Oropharynx and nasopharynx clear.  NECK:  Supple, no jugular venous distention. No thyroid enlargement, no tenderness.  LUNGS: Normal breath sounds bilaterally, no wheezing, bibasilar rales, Occasional rhonchi. No use of  accessory muscles of respiration.  CARDIOVASCULAR: S1, S2 normal. No murmurs, rubs, or gallops.  ABDOMEN: Soft, nontender, nondistended. Bowel sounds present. No organomegaly or mass.  EXTREMITIES: No cyanosis, clubbing or edema b/l.    NEUROLOGIC: Cranial nerves II through XII are intact. No focal Motor or sensory deficits b/l.   PSYCHIATRIC: The patient is alert and oriented x 3.  SKIN: Psoriatic plaques on her back and on her elbows and arms bilateral laterally, lesion, or ulcer.    LABORATORY PANEL:   CBC Recent Labs  Lab 07/28/18 0420  WBC 8.4  HGB 12.4  HCT 39.6  PLT 318   ------------------------------------------------------------------------------------------------------------------  Chemistries  Recent Labs  Lab 07/25/18 2245  07/29/18 0432  NA  --    < > 139  K  --    < > 3.7  CL  --    < > 99  CO2  --    < > 30  GLUCOSE  --    < > 92  BUN  --    < > 30*  CREATININE  --    < > 0.84  CALCIUM  --    < > 8.9  MG 1.7  --   --   AST 24  --   --   ALT 24  --   --   ALKPHOS 70  --   --   BILITOT 1.0  --   --    < > = values in this interval not displayed.   ------------------------------------------------------------------------------------------------------------------  Cardiac Enzymes Recent Labs  Lab 07/26/18 0214  TROPONINI 0.03*   ------------------------------------------------------------------------------------------------------------------  RADIOLOGY:  No results found.  ASSESSMENT AND PLAN:   73 year old female with past medical history of hypertension, tobacco abuse, hyperlipidemia COPD who presented to the hospital due to shortness of breath and cough.  1.  Acute respiratory failure with hypoxia-secondary to underlying acute on chronic systolic CHF and also COPD. -Continue O2 supplementation, wean off as tolerated -continue 40 mg IV twice daily and closely monitor renal function  -Continue scheduled duo nebs, Pulmicort nebs. -Losartan  25 mg is added to the regimen continue Coreg, monitor blood pressure and titrate medications  2.  CHF- suspected to be acute on chronic systolic CHF.   Patient's previous EF was 15 to 20% and she has not followed up with a physician for years. Patient's echocardiogram showing severe dilated cardiomyopathy with EF of 15 to 20%.   Discussed with cardiology and they plan on reinitiating ischemic work-up and for cardiac catheterization on Monday. -Continue gentle diuresis with IV Lasix and follow renal function closely.   Follow I's and O's and daily weights.   Continue carvedilol, 25 mg of low-dose losartan added to the regimen by cardiology  3.  Dilated cardiomyopathy with history of coronary disease status post bypass- cardiology plans on doing right and left heart catheterization on Monday.  Continue medical management as mentioned above.  4.  COPD- mild acute exacerbation.  No need for IV steroids.   - cont. Duonebs, pulmicort nebs.  -Check ambulatory pulse ox prior to the discharge to see if patient meets home oxygen criteria  5.  Essential hypertension-continue carvedilol hold Lisinopril.   6.  Psoriasis-patient had extensive rash on her back and her elbows and arms.   topical steroids with clobetasol.  Explained to the patient that she needs to follow-up with dermatology as an outpatient.   All the records are reviewed and case discussed with Care Management/Social Worker. Management plans discussed with the patient, she  in agreement.  CODE STATUS: Full code  DVT Prophylaxis: Lovenox  TOTAL TIME TAKING CARE OF THIS PATIENT: 27 minutes.   POSSIBLE D/C IN 1-2 DAYS, DEPENDING ON CLINICAL CONDITION.   Ramonita Lab M.D on 07/29/2018 at 2:34 PM  Between 7am to 6pm - Pager - [phone]  After 6pm go to [email or web address] - Social research officer, government  [facility]  Office  [phone]  CC: Primary care physician; Patient, No Pcp Per

## 2018-07-30 ENCOUNTER — Encounter: Payer: Self-pay | Admitting: Cardiovascular Disease

## 2018-07-30 ENCOUNTER — Telehealth: Payer: Self-pay | Admitting: *Deleted

## 2018-07-30 ENCOUNTER — Encounter
Admission: EM | Disposition: A | Discharge status: Home / Self Care | Payer: Self-pay | Source: Home / Self Care | Attending: Specialist

## 2018-07-30 ENCOUNTER — Other Ambulatory Visit: Payer: Self-pay | Admitting: Physician Assistant

## 2018-07-30 DIAGNOSIS — I2510 Atherosclerotic heart disease of native coronary artery without angina pectoris: Secondary | ICD-10-CM

## 2018-07-30 DIAGNOSIS — I5021 Acute systolic (congestive) heart failure: Secondary | ICD-10-CM

## 2018-07-30 DIAGNOSIS — I509 Heart failure, unspecified: Secondary | ICD-10-CM

## 2018-07-30 HISTORY — PX: RIGHT/LEFT HEART CATH AND CORONARY/GRAFT ANGIOGRAPHY: CATH118267

## 2018-07-30 LAB — CBC
HCT: 44.7 % (ref 36.0–46.0)
Hemoglobin: 14.2 g/dL (ref 12.0–15.0)
MCH: 29.3 pg (ref 26.0–34.0)
MCHC: 31.8 g/dL (ref 30.0–36.0)
MCV: 92.4 fL (ref 80.0–100.0)
Platelets: 337 10*3/uL (ref 150–400)
RBC: 4.84 MIL/uL (ref 3.87–5.11)
RDW: 13.7 % (ref 11.5–15.5)
WBC: 11 10*3/uL — AB (ref 4.0–10.5)
nRBC: 0 % (ref 0.0–0.2)

## 2018-07-30 LAB — BASIC METABOLIC PANEL
Anion gap: 13 (ref 5–15)
Anion gap: 14 (ref 5–15)
BUN: 32 mg/dL — ABNORMAL HIGH (ref 8–23)
BUN: 36 mg/dL — ABNORMAL HIGH (ref 8–23)
CO2: 25 mmol/L (ref 22–32)
CO2: 27 mmol/L (ref 22–32)
Calcium: 9.2 mg/dL (ref 8.9–10.3)
Calcium: 9.3 mg/dL (ref 8.9–10.3)
Chloride: 98 mmol/L (ref 98–111)
Chloride: 99 mmol/L (ref 98–111)
Creatinine, Ser: 0.88 mg/dL (ref 0.44–1.00)
Creatinine, Ser: 0.97 mg/dL (ref 0.44–1.00)
GFR calc Af Amer: >60 mL/min (ref >60)
GFR calc non Af Amer: 58 mL/min — ABNORMAL LOW (ref >60)
GFR calc non Af Amer: >60 mL/min (ref >60)
Glucose, Bld: 123 mg/dL — ABNORMAL HIGH (ref 70–99)
Glucose, Bld: 97 mg/dL (ref 70–99)
POTASSIUM: 4 mmol/L (ref 3.5–5.1)
Potassium: 4.1 mmol/L (ref 3.5–5.1)
Sodium: 138 mmol/L (ref 135–145)
Sodium: 138 mmol/L (ref 135–145)

## 2018-07-30 SURGERY — RIGHT/LEFT HEART CATH AND CORONARY/GRAFT ANGIOGRAPHY
Anesthesia: Moderate Sedation

## 2018-07-30 MED ORDER — SPIRONOLACTONE 25 MG PO TABS
25.0000 mg | ORAL_TABLET | Freq: Every day | ORAL | Status: DC
  Administered 2018-07-30: 25 mg via ORAL
  Filled 2018-07-30: qty 1

## 2018-07-30 MED ORDER — SODIUM CHLORIDE 0.9 % IV SOLN: 250.0000 mL | INTRAVENOUS | Status: DC | PRN

## 2018-07-30 MED ORDER — MIDAZOLAM HCL 2 MG/2ML IJ SOLN
INTRAMUSCULAR | Status: AC
  Filled 2018-07-30: qty 2

## 2018-07-30 MED ORDER — ATORVASTATIN CALCIUM 80 MG PO TABS: 80.0000 mg | ORAL_TABLET | Freq: Every day | ORAL | 0 refills | Status: DC

## 2018-07-30 MED ORDER — HEPARIN (PORCINE) IN NACL 1000-0.9 UT/500ML-% IV SOLN
INTRAVENOUS | Status: DC | PRN
  Administered 2018-07-30 (×2): 500 mL

## 2018-07-30 MED ORDER — MIDAZOLAM HCL 2 MG/2ML IJ SOLN
INTRAMUSCULAR | Status: DC | PRN
  Administered 2018-07-30: 1 mg via INTRAVENOUS

## 2018-07-30 MED ORDER — SODIUM CHLORIDE 0.9% FLUSH: 3.0000 mL | INTRAVENOUS | Status: DC | PRN

## 2018-07-30 MED ORDER — ASPIRIN 81 MG PO CHEW: 81.0000 mg | CHEWABLE_TABLET | Freq: Every day | ORAL | 0 refills | Status: DC

## 2018-07-30 MED ORDER — FUROSEMIDE 40 MG PO TABS: 40.0000 mg | ORAL_TABLET | Freq: Every day | ORAL | 0 refills | Status: DC

## 2018-07-30 MED ORDER — FENTANYL CITRATE (PF) 100 MCG/2ML IJ SOLN
INTRAMUSCULAR | Status: DC | PRN
  Administered 2018-07-30: 50 ug via INTRAVENOUS

## 2018-07-30 MED ORDER — LOSARTAN POTASSIUM 25 MG PO TABS: 25.0000 mg | ORAL_TABLET | Freq: Every day | ORAL | 0 refills | Status: DC

## 2018-07-30 MED ORDER — CARVEDILOL 3.125 MG PO TABS: 3.1250 mg | ORAL_TABLET | Freq: Two times a day (BID) | ORAL | 0 refills | Status: DC

## 2018-07-30 MED ORDER — FUROSEMIDE 40 MG PO TABS
40.0000 mg | ORAL_TABLET | Freq: Every day | ORAL | Status: DC
  Administered 2018-07-30: 40 mg via ORAL
  Filled 2018-07-30: qty 1

## 2018-07-30 MED ORDER — SPIRONOLACTONE 25 MG PO TABS: 25.0000 mg | ORAL_TABLET | Freq: Every day | ORAL | 0 refills | Status: DC

## 2018-07-30 MED ORDER — HEPARIN (PORCINE) IN NACL 1000-0.9 UT/500ML-% IV SOLN
INTRAVENOUS | Status: AC
  Filled 2018-07-30: qty 1000

## 2018-07-30 MED ORDER — IOPAMIDOL (ISOVUE-300) INJECTION 61%
INTRAVENOUS | Status: DC | PRN
  Administered 2018-07-30: 80 mL via INTRA_ARTERIAL

## 2018-07-30 MED ORDER — FENTANYL CITRATE (PF) 100 MCG/2ML IJ SOLN
INTRAMUSCULAR | Status: AC
  Filled 2018-07-30: qty 2

## 2018-07-30 MED ORDER — BUDESONIDE 0.5 MG/2ML IN SUSP: 0.5000 mg | Freq: Two times a day (BID) | RESPIRATORY_TRACT | 0 refills | Status: DC

## 2018-07-30 MED ORDER — ENSURE ENLIVE PO LIQD: 237.0000 mL | ORAL | 12 refills | Status: DC

## 2018-07-30 MED ORDER — SODIUM CHLORIDE 0.9% FLUSH: 3.0000 mL | Freq: Two times a day (BID) | INTRAVENOUS | Status: DC

## 2018-07-30 SURGICAL SUPPLY — 15 items
CATH INFINITI 5 FR IM (CATHETERS) ×3 IMPLANT
CATH INFINITI 5FR ANG PIGTAIL (CATHETERS) ×3 IMPLANT
CATH INFINITI 5FR JL4 (CATHETERS) ×3 IMPLANT
CATH INFINITI JR4 5F (CATHETERS) ×3 IMPLANT
CATH SWANZ 7F THERMO (CATHETERS) ×3 IMPLANT
DEVICE CLOSURE MYNXGRIP 5F (Vascular Products) ×3 IMPLANT
GUIDEWIRE EMER 3M J .025X150CM (WIRE) ×3 IMPLANT
KIT MANI 3VAL PERCEP (MISCELLANEOUS) ×3 IMPLANT
KIT RIGHT HEART (MISCELLANEOUS) ×3 IMPLANT
NEEDLE PERC 18GX7CM (NEEDLE) ×3 IMPLANT
PACK CARDIAC CATH (CUSTOM PROCEDURE TRAY) ×3 IMPLANT
SHEATH AVANTI 5FR X 11CM (SHEATH) ×3 IMPLANT
SHEATH AVANTI 7FRX11 (SHEATH) ×3 IMPLANT
WIRE EMERALD 3MM-J .035X260CM (WIRE) ×3 IMPLANT
WIRE GUIDERIGHT .035X150 (WIRE) ×3 IMPLANT

## 2018-07-30 NOTE — Interval H&P Note (Signed)
Cath Lab Visit (complete for each Cath Lab visit)  Clinical Evaluation Leading to the Procedure:   ACS: Yes.    Non-ACS:  n/a NYHA class 4      History and Physical Interval Note:  07/30/2018 7:59 AM  Dana [name]  has presented today for surgery, with the diagnosis of Coronary Artery Disease.  The various methods of treatment have been discussed with the patient and family. After consideration of risks, benefits and other options for treatment, the patient has consented to  Procedure(s): RIGHT/LEFT HEART CATH AND CORONARY/GRAFT ANGIOGRAPHY (N/A) as a surgical intervention.  The patient's history has been reviewed, patient examined, no change in status, stable for surgery.  I have reviewed the patient's chart and labs.  Questions were answered to the patient's satisfaction.     Lorine Bears

## 2018-07-30 NOTE — Progress Notes (Signed)
Patient clinically stable post heart cath. Sinus per monitor. Denies complaints. No bleeding nor hematoma at right groin site. Report called to care nurse with questions answered.

## 2018-07-30 NOTE — Care Management Important Message (Signed)
Important Message  Patient Details  Name: Dana [name] MRN: [id number] Date of Birth: 05-20-1945   Medicare Important Message Given:  Yes    [name] 07/30/2018, 1:11 PM

## 2018-07-30 NOTE — Telephone Encounter (Signed)
-----   Message from Lennon Alstrom, [place] sent at 07/30/2018  1:03 PM EDT ----- Regarding: TCM appointment Hello,  This patient was admitted and seen at [facility] for heart failure and CAD and had a right and left heart cardiac catheterization done today.  We are expecting discharge later today 3/30.  (1) Can you please call and arrange / schedule for follow-up virtual TCM appointment with Dr. Mariah Milling within the next two weeks?  (2) Also, can you set up for a CMP within the next 1 week and to be done at the medical mall?   Thank you! SignedLennon Alstrom, PA-C 07/30/2018, 1:04 PM Pager [phone]

## 2018-07-30 NOTE — TOC Transition Note (Signed)
Transition of Care [facility]) - CM/SW Discharge Note   Patient Details  Name: Dana [name] MRN: [id number] Date of Birth: 26-Apr-1946  Transition of Care [facility]) CM/SW Contact:  Sherren Kerns, RN Phone Number: 07/30/2018, 11:55 AM   Clinical Narrative:   Patient discharging today.  No further needs identified by [facility].    Final next level of care: Home/Self Care Barriers to Discharge: No Barriers Identified   Patient Goals and CMS Choice Patient states their goals for this hospitalization and ongoing recovery are:: get well and go home      Discharge Placement                       Discharge Plan and Services   Discharge Planning Services: CM Consult, HF Clinic                HH Arranged: Patient Refused [facility]     Social Determinants of Health (SDOH) Interventions     Readmission Risk Interventions Readmission Risk Prevention Plan 07/26/2018  Post Dischage Appt Complete  Medication Screening Complete  Transportation Screening Complete  Some recent data might be hidden

## 2018-07-30 NOTE — Progress Notes (Signed)
Progress Note  Patient Name: Dana [name] Date of Encounter: 07/30/2018  Primary Cardiologist: Julien Nordmann, MD   Subjective   Patient is status post right and left heart cardiac catheterization with coronary angiography today.    No chest pain reported at this time.  No complaint of palpitations or racing heart rate.  She does feel like her lower extremity swelling has significantly improved since her admission, as well as her breathing.  Does have an intermittent cough and still occasionally feels short of breath.  Patient is eager to go home and start her lifestyle changes / enjoy the weather.   Inpatient Medications    Scheduled Meds: . aspirin  81 mg Oral Daily  . atorvastatin  80 mg Oral q1800  . budesonide (PULMICORT) nebulizer solution  0.5 mg Nebulization BID  . carvedilol  3.125 mg Oral BID WC  . clobetasol cream   Topical BID  . enoxaparin (LOVENOX) injection  40 mg Subcutaneous Q24H  . feeding supplement (ENSURE ENLIVE)  237 mL Oral Q24H  . furosemide  40 mg Oral Daily  . ipratropium-albuterol  3 mL Nebulization Q6H  . losartan  25 mg Oral Daily  . sodium chloride flush  3 mL Intravenous Q12H  . sodium chloride flush  3 mL Intravenous Q12H  . spironolactone  25 mg Oral Daily   Continuous Infusions: . sodium chloride     PRN Meds: sodium chloride, acetaminophen **OR** acetaminophen, bisacodyl, guaiFENesin-dextromethorphan, ondansetron **OR** ondansetron (ZOFRAN) IV, senna-docusate, sodium chloride flush   Vital Signs    Vitals:   07/30/18 0920 07/30/18 0930 07/30/18 0945 07/30/18 1000  BP: 122/64 113/60 124/77 (!) 117/57  Pulse:      Resp: 19 (!) 24 20 18   Temp:      TempSrc:      SpO2: 98% 98% 98%   Weight:      Height:        Intake/Output Summary (Last 24 hours) at 07/30/2018 1250 Last data filed at 07/30/2018 1059 Gross per 24 hour  Intake 600 ml  Output 820 ml  Net -220 ml   Filed Weights   07/29/18 0500 07/30/18 0500 07/30/18 0718   Weight: 71.6 kg 69.7 kg 69.7 kg    Telemetry    Sinus rhythm- Personally Reviewed  ECG    No new tracings- Personally Reviewed  Physical Exam   GEN: No acute distress.  Sitting on the edge of the bed Cardiac: RRR, 1/6 systolic murmurs, rubs, or gallops.  Respiratory: Bilateral wheezing, coarse breath sounds, rhonchi cleared with cough GI: Soft, nontender, non-distended  MS: Improved non-pitting lower extremity edema; No deformity.  Psoriasis noted on bilateral lower extremities, elbows, and lower back. Neuro:  Nonfocal  Psych: Normal affect   Labs    Chemistry Recent Labs  Lab 07/25/18 2245  07/29/18 0432 07/30/18 0000 07/30/18 0425  NA  --    < > 139 138 138  K  --    < > 3.7 4.0 4.1  CL  --    < > 99 98 99  CO2  --    < > 30 27 25   GLUCOSE  --    < > 92 123* 97  BUN  --    < > 30* 36* 32*  CREATININE  --    < > 0.84 0.97 0.88  CALCIUM  --    < > 8.9 9.2 9.3  PROT 7.5  --   --   --   --  ALBUMIN 4.2  --   --   --   --   AST 24  --   --   --   --   ALT 24  --   --   --   --   ALKPHOS 70  --   --   --   --   BILITOT 1.0  --   --   --   --   GFRNONAA  --    < > >60 58* >60  GFRAA  --    < > >60 >60 >60  ANIONGAP  --    < > 10 13 14    < > = values in this interval not displayed.     Hematology Recent Labs  Lab 07/25/18 1825 07/28/18 0420 07/30/18 0000  WBC 9.5 8.4 11.0*  RBC 4.21 4.24 4.84  HGB 12.3 12.4 14.2  HCT 39.7 39.6 44.7  MCV 94.3 93.4 92.4  MCH 29.2 29.2 29.3  MCHC 31.0 31.3 31.8  RDW 14.1 14.0 13.7  PLT 326 318 337    Cardiac Enzymes Recent Labs  Lab 07/25/18 1825 07/25/18 2245 07/26/18 0214  TROPONINI <0.03 <0.03 0.03*   No results for input(s): TROPIPOC in the last 168 hours.   BNP Recent Labs  Lab 07/25/18 1825  BNP 1,289.0*     DDimer No results for input(s): DDIMER in the last 168 hours.   Radiology    No results found.  Cardiac Studies   R/LHC with Graft and Cors  07/30/2018  Mid LM to Dist LM lesion is 20%  stenosed.  Prox LAD lesion is 80% stenosed.  Ost 1st Diag lesion is 80% stenosed.  Mid Cx lesion is 80% stenosed.  SVG and is normal in caliber.  The graft exhibits no disease.  LIMA and is normal in caliber.  The graft exhibits no disease. 1.  Significant two-vessel coronary artery disease involving proximal LAD at the bifurcation of first diagonal as well as mid left circumflex.  Patent grafts including LIMA to LAD and SVG to diagonal.  The LAD gets mostly antegrade flow but in spite of that the LIMA is not atretic.  Left circumflex distribution area is medium in size. 2.  Right heart catheterization showed mildly elevated filling pressures with pulmonary wedge pressure of 13 mmHg, mild pulmonary hypertension at 38 over 40 mmHg and severely reduced cardiac output at 3.13 with an index of 1.81. Recommendations: No need to revascularize the LAD given that the LIMA is patent.  PCI of the left circumflex is possible but I doubt it will make significant difference on ejection fraction.  This should be reserved for refractory angina. Recommend optimizing medical therapy for chronic systolic heart failure. I switch furosemide to oral.  I added spironolactone.  The patient can likely be discharged home tomorrow if she remains stable. The plan is to switch her from losartan to [facility] as an outpatient.  TTE 07/26/2018 1. The left ventricle has severely reduced systolic function, with an ejection fraction < 20%. The cavity size was severely dilated. Global hypokinesis, Concern for severe hypokinesis/possible akinesis of the anterior/anterseptal wall/apical. Unable to  determine diastolic parameters  2. The right ventricle has mildly reduced systolic function. The cavity was normal. There is no increase in right ventricular wall thickness. Right ventricular systolic pressure is mildly elevated with an estimated pressure of 37.7 mmHg.  3. The aortic valve is grossly normal Moderate calcification of  the aortic valve. Aortic valve regurgitation was not assessed by  color flow Doppler.  4. Mitral valve regurgitation is moderate  5. Left atrial size was moderately dilated.   Patient Profile     73 y.o. female with a history of CAD s/p 01/2012 bypass surgery, HFrEF/ICM (EF <20%, 07/2018), current smoker, and who is being seen today for the evaluation of shortness of breath and HFrEF and who is s/p cardiac catheterization today 07/30/2018.  Assessment & Plan    Acute on chronic systolic CHF  - Echo as above showing EF less than 20% and hypokinesis, elevated RV systolic pressure.  --Significant output this admission as IV diuresed this admission with almost 7 lbs weight loss and -4 L per documentation. Renal function stable with creatinine 0.88.  Potassium at goal. - Transitioned to oral Lasix 40mg  daily and started Spironolactone after cath.  Will need follow-up BMET in 1 week with message sent to arrange collection at the medical mall.   --TCM virtual appointment (per current Covid19 pandemic protocol) in approximately 2 weeks with Dr. Mariah Milling. Scheduling notified.  CAD/ICM s/p CABGx2 (2013), troponin elevation without chest pain - H/o CAD as in HPI with bypass in 2013. Troponin elevation this admission with minimal 0.03 elevation and without chest pain.  Significant 2v disease, mildly elevated PASP/PCWP, and severely reduced CO as above in CV studies. PCI not performed with recommendation for optimized medical therapy at this time.  In the future, could consider PCI of the left circumflex for refractory angina. --Catheterization site non-tender and without signs of infection, active bleeding s/p cath -Plan to switch from Losartan to [facility] as an outpatient. -Continue ASA 81 mg, Coreg 3.125 mg twice daily, atorvastatin 80 mg daily, losartan 25 mg daily, and medical management as above.  Follow-up bmet in 1 week as above.  Will need updated lipids/ liver function in approximately 6 to 8 weeks.   NSVT - Asx. Short runs of NSVT seen on telemetry. Could consider Zio in future if patient becomes symptomatic.   Medication noncompliance -Lost to follow-up after CABG in 2013 and stopped medications 1 year later. Compliance stressed, aggressive risk factor modification recommended.  Tobacco abuse -Cessation advised.  Lifestyle changes discussed with patient.  Patient stated will attempt to stop smoking.   Hypothyroid -TSH 6.872.  Recommendation for optimization of thyroid function as an outpatient to reduce risk factors.  COPD -Long history of smoking.  Patient agreeable to attempt smoking cessation at this time.   For questions or updates, please contact [facility] Please consult [email or web address] for contact info under        Signed, Lennon Alstrom, PA-C  07/30/2018, 12:50 PM

## 2018-07-30 NOTE — Discharge Instructions (Signed)
Acute Coronary Syndrome    Acute coronary syndrome (ACS) is a serious problem in which there is suddenly not enough blood and oxygen reaching the heart. ACS can result in chest pain or a heart attack.  This condition is a medical emergency. If you have any symptoms of this condition, get help right away.  What are the causes?  This condition may be caused by:   Buildup of fat and cholesterol inside of the arteries (atherosclerosis). This is the most common cause. The buildup (plaque) can cause blood vessels in the heart (coronary arteries) to become narrow or blocked, which reduces blood flow to the heart. Plaque can also break off and lead to a clot, which can block an artery and cause a heart attack or stroke.   Sudden tightening of the muscles around the coronary arteries (coronary spasm).   Tearing of a coronary artery (spontaneous coronary artery dissection).   Very low blood pressure (hypotension).   An abnormal heartbeat (arrhythmia).   Other medical conditions that cause a decrease of oxygen to the heart, such as anemiaorrespiratory failure.   Using cocaine or methamphetamine.  What increases the risk?  The following factors may make you more likely to develop this condition:   Age. The risk for ACS increases as you get older.   History of chest pain, heart attack, peripheral artery disease, or stroke.   Having taken chemotherapy or immune-suppressing medicines.   Being female.   Family history of chest pain, heart disease, or stroke.   Smoking.   Not exercising enough.   Being overweight.   High cholesterol.   High blood pressure (hypertension).   Diabetes.   Excessive alcohol use.  What are the signs or symptoms?  Common symptoms of this condition include:   Chest pain. The pain may last a long time, or it may stop and come back (recur). It may feel like:  ? Crushing or squeezing.  ? Tightness, pressure, fullness, or heaviness.   Arm, neck, jaw, or back pain.   Heartburn or  indigestion.   Shortness of breath.   Nausea.   Sudden cold sweats.   Light-headedness.   Dizziness, or passing out.   Tiredness (fatigue).  Sometimes there are no symptoms.  How is this diagnosed?  This condition may be diagnosed based on:   Your medical history and symptoms.   An electrocardiogram (ECG). This imaging test measures the heart's electrical activity.   Blood tests. Cardiac blood tests may need to be repeated at designated time intervals.   Chest X-ray.   A CT scan of the chest.   A coronary angiogram. This is a procedure in which dye is injected into the bloodstream and then X-rays are taken to show if there is a blockage in a coronary artery.   Exercise stress testing.   Echocardiography. This is a test that uses sound waves to produce detailed images of the heart.  How is this treated?  The treatment is to restore blood flow to the heart as soon as possible. Treatment for this condition may include:   Oxygen therapy.   Medicines, such as:  ? Antiplatelet medicines and blood-thinning medicines, such as aspirin. These help prevent blood clots.  ? Medicine that dissolves any blood clots (fibrinolytic therapy).  ? Blood pressure medicines.  ? Nitroglycerin. This helps relieve chest pain and widens blood vessels to improve blood flow.  ? Pain medicine.  ? Cholesterol-lowering medicine.   Surgery, such as:  ? Coronary angioplasty with   stent placement. This involves placing a small piece of metal that looks like mesh or a spring into a narrow coronary artery. This widens the artery and keep it open.  ? Coronary artery bypass surgery. This involves taking a section of a blood vessel from a different part of your body, and placing it on the blocked coronary artery to allow blood to flow around (bypass) the blockage.   Cardiac rehabilitation. This is a program that helps improve your health and well-being. It includes exercise training, education, and counseling to help you recover.  Follow  these instructions at home:  Eating and drinking   Eat a heart-healthy diet that includes whole grains, fruits and vegetables, lean proteins, and low-fat or nonfat dairy products.   Limit how much salt (sodium) you eat as told by your health care provider. Follow instructions from your health care provider about any other eating or drinking restrictions, such as limiting foods that are high in fat and processed sugars.   Use healthy cooking methods such as roasting, grilling, broiling, baking, poaching, steaming, or stir-frying.   Talk with a dietitian to learn about healthy cooking methods and how to eat less sodium.  Medicines   Take over-the-counter and prescription medicines only as told by your health care provider.   Do not take these medicines unless your health care provider approves:  ? Vitamin supplements that contain vitamin A or vitamin E.  ? Nonsteroidal anti-inflammatory drugs (NSAIDs), such as ibuprofen, naproxen, or celecoxib.  ? Hormone replacement therapy that contains estrogen.  If you are taking blood thinners:   Talk with your health care provider before you take any medicines that contain aspirin or NSAIDs. These medicines increase your risk for dangerous bleeding.   Take your medicine exactly as told, at the same time every day.   Avoid activities that could cause injury or bruising, and follow instructions about how to prevent falls.   Wear a medical alert bracelet, and carry a card that lists what medicines you take.  Activity   Join a cardiac rehabilitation program. An exercise plan will be developed for you.   Ask your health care provider:  ? What activities and exercises are safe for you.  ? If you should follow specific instructions about lifting, driving, or climbing stairs.  Lifestyle   Do not use any products that contain nicotine or tobacco, such as cigarettes and e-cigarettes. If you need help quitting, ask your health care provider.   If your health care provider  says that alcohol is safe for you, limit your alcohol intake to no more than 1 drink a day. One drink equals 12 oz of beer, 5 oz of wine, or 1 oz of hard liquor.   Maintain a healthy weight. If you need to lose weight, work with your health care provider to do so safely.  General instructions   Tell all the health care providers who care for you about your heart condition, including your dentist. This may affect the medicines or treatment you receive.   Manage any other health conditions you have, such as hypertension or diabetes. These conditions affect your heart.   Learn ways to manage stress.   Get screened for depression, and get mental health treatment if you need it. People with ACS are at higher risk for depression.   Keep your vaccinations up to date. Get the flu shot (influenza vaccine) every year.   If directed, monitor your blood pressure at home.   Keep all   follow-up visits as told by your health care provider. This is important.  Contact a health care provider if:   You feel overwhelmed or sad.   You have trouble doing your daily activities.  Get help right away if:   You have pain in your chest, neck, arm, jaw, stomach, or back that recurs, and:  ? It lasts for more than a few minutes.  ? It is not relieved by taking the medicineyour health care provider prescribed.   You have unexplained:  ? Heavy sweating.  ? Heartburn or indigestion.  ? Nausea or vomiting.  ? Shortness of breath.  ? Difficulty breathing.  ? Fatigue.  ? Nervousness or anxiety.  ? Weakness.  ? Diarrhea.  ? Dark stools or blood in your stool.   You have sudden light-headedness or dizziness.   Your blood pressure is higher than 180/120.   You faint.   You have thoughts about hurting yourself.  These symptoms may represent a serious problem that is an emergency. Do not wait to see if the symptoms will go away. Get medical help right away. Call your local emergency services (911 in the [place] Do not drive yourself to the  hospital.  If you ever feel like you may hurt yourself or others, or have thoughts about taking your own life, get help right away. You can go to your nearest emergency department or call:   Emergency services (911 in the [place]   A suicide crisis helpline, such as the National Suicide Prevention Lifeline at 1-800-273-8255. This is open 24 hours a day.  Summary   Acute coronary syndrome (ACS) is when there is not enough blood and oxygen being supplied to the heart. ACS can result in chest pain or a heart attack.   Acute coronary syndrome is a medical emergency. If you have any symptoms of this condition, get help right away.   Treatment includes medicines and procedures to open the blocked arteries and restore blood flow.  This information is not intended to replace advice given to you by your health care provider. Make sure you discuss any questions you have with your health care provider.  Document Released: 04/18/2005 Document Revised: 12/27/2016 Document Reviewed: 12/27/2016  Elsevier Interactive Patient Education  2019 [employer]

## 2018-07-31 NOTE — Telephone Encounter (Signed)
Patient agreed to the consent and telephone visit.  [facility]work.  She wrote this down on her calendar.  Patient contacted regarding discharge from [facility] on 07/30/18.  Patient understands to follow up with provider Gollan on 08/08/18 at 1:40pm at [facility]. Patient understands discharge instructions? yes Patient understands medications and regiment? yes Patient understands to bring all medications to this visit? yes        Virtual Visit Pre-Appointment Phone Call  Steps For Call:  1. Confirm consent - "In the setting of the current Covid19 crisis, you are scheduled for a (phone or video) visit with your provider on (date) at (time).  Just as we do with many in-office visits, in order for you to participate in this visit, we must obtain consent.  If you'd like, I can send this to your mychart (if signed up) or email for you to review.  Otherwise, I can obtain your verbal consent now.  All virtual visits are billed to your insurance company just like a normal visit would be.  By agreeing to a virtual visit, we'd like you to understand that the technology does not allow for your provider to perform an examination, and thus may limit your provider's ability to fully assess your condition.  Finally, though the technology is pretty good, we cannot assure that it will always work on either your or our end, and in the setting of a video visit, we may have to convert it to a phone-only visit.  In either situation, we cannot ensure that we have a secure connection.  Are you willing to proceed?"  2. Give patient instructions for WebEx download to smartphone as below if video visit  3. Advise patient to be prepared with any vital sign or heart rhythm information, their current medicines, and a piece of paper and pen handy for any instructions they may receive the day of their visit  4. Inform patient they will receive a phone  call 15 minutes prior to their appointment time (may be from unknown caller ID) so they should be prepared to answer  5. Confirm that appointment type is correct in Epic appointment notes (video vs telephone)    TELEPHONE CALL NOTE  Roneisha Urbain has been deemed a candidate for a follow-up tele-health visit to limit community exposure during the Covid-19 pandemic. I spoke with the patient via phone to ensure availability of phone/video source, confirm preferred email & phone number, and discuss instructions and expectations.  I reminded Elsbeth Friederichs to be prepared with any vital sign and/or heart rhythm information that could potentially be obtained via home monitoring, at the time of her visit. I reminded [name] to expect a phone call at the time of her visit if her visit.  Did the patient verbally acknowledge consent to treatment? yes  Catalina Gravel, RN 07/31/2018 9:23 AM   DOWNLOADING THE WEBEX SOFTWARE TO SMARTPHONE  - If Apple, go to [employer] and type in WebEx in the search bar. Download [employer], the blue/green circle. The app is free but as with any other app downloads, their phone may require them to verify saved payment information or Apple password. The patient does NOT have to create an account.  - If Android, ask patient to go to [employer] and type in WebEx in the search bar. Download [employer], the blue/green circle. The app is free but as with  any other app downloads, their phone may require them to verify saved payment information or Android password. The patient does NOT have to create an account.   CONSENT FOR TELE-HEALTH VISIT - PLEASE REVIEW  I hereby voluntarily request, consent and authorize [facility] and its employed or contracted physicians, physician assistants, nurse practitioners or other licensed health care professionals (the Practitioner), to provide me with telemedicine health care services (the  "Services") as deemed necessary by the treating Practitioner. I acknowledge and consent to receive the Services by the Practitioner via telemedicine. I understand that the telemedicine visit will involve communicating with the Practitioner through live audiovisual communication technology and the disclosure of certain medical information by electronic transmission. I acknowledge that I have been given the opportunity to request an in-person assessment or other available alternative prior to the telemedicine visit and am voluntarily participating in the telemedicine visit.  I understand that I have the right to withhold or withdraw my consent to the use of telemedicine in the course of my care at any time, without affecting my right to future care or treatment, and that the Practitioner or I may terminate the telemedicine visit at any time. I understand that I have the right to inspect all information obtained and/or recorded in the course of the telemedicine visit and may receive copies of available information for a reasonable fee.  I understand that some of the potential risks of receiving the Services via telemedicine include:  [name] Delay or interruption in medical evaluation due to technological equipment failure or disruption; . Information transmitted may not be sufficient (e.g. poor resolution of images) to allow for appropriate medical decision making by the Practitioner; and/or  . In rare instances, security protocols could fail, causing a breach of personal health information.  Furthermore, I acknowledge that it is my responsibility to provide information about my medical history, conditions and care that is complete and accurate to the best of my ability. I acknowledge that Practitioner's advice, recommendations, and/or decision may be based on factors not within their control, such as incomplete or inaccurate data provided by me or distortions of diagnostic images or specimens that may result from  electronic transmissions. I understand that the practice of medicine is not an exact science and that Practitioner makes no warranties or guarantees regarding treatment outcomes. I acknowledge that I will receive a copy of this consent concurrently upon execution via email to the email address I last provided but may also request a printed copy by calling the office of [facility].    I understand that my insurance will be billed for this visit.   I have read or had this consent read to me. . I understand the contents of this consent, which adequately explains the benefits and risks of the Services being provided via telemedicine.  . I have been provided ample opportunity to ask questions regarding this consent and the Services and have had my questions answered to my satisfaction. . I give my informed consent for the services to be provided through the use of telemedicine in my medical care  By participating in this telemedicine visit I agree to the above.

## 2018-08-01 ENCOUNTER — Telehealth: Payer: Self-pay

## 2018-08-01 NOTE — Telephone Encounter (Signed)
TELEPHONE CALL NOTE  Dana [name] has been deemed a candidate for a follow-up tele-health visit to limit community exposure during the Covid-19 pandemic. I spoke with the patient via phone to ensure availability of phone/video source, confirm preferred email & phone number, discuss instructions and expectations, and review consent.   I reminded Dana [name] to be prepared with any vital sign and/or heart rhythm information that could potentially be obtained via home monitoring, at the time of her visit.  Finally, I reminded Dana [name] to expect an e-mail containing a link for their video-based visit approximately 15 minutes before her visit, or alternatively, a phone call at the time of her visit if her visit is planned to be a phone encounter.  Did the patient verbally consent to treatment as below? YES  [name], CMA 08/01/2018 12:32 PM  CONSENT FOR TELE-HEALTH VISIT - PLEASE REVIEW  I hereby voluntarily request, consent and authorize [facility] and its employed or contracted physicians, physician assistants, nurse practitioners or other licensed health care professionals (the Practitioner), to provide me with telemedicine health care services (the "Services") as deemed necessary by the treating Practitioner. I acknowledge and consent to receive the Services by the Practitioner via telemedicine. I understand that the telemedicine visit will involve communicating with the Practitioner through telephonic communication technology and the disclosure of certain medical information by electronic transmission. I acknowledge that I have been given the opportunity to request an in-person assessment or other available alternative prior to the telemedicine visit and am voluntarily participating in the telemedicine visit.  I understand that I have the right to withhold or withdraw my consent to the use of telemedicine in the course of my care at any time, without  affecting my right to future care or treatment, and that the Practitioner or I may terminate the telemedicine visit at any time. I understand that I have the right to inspect all information obtained and/or recorded in the course of the telemedicine visit and may receive copies of available information for a reasonable fee.  I understand that some of the potential risks of receiving the Services via telemedicine include:  [name] Delay or interruption in medical evaluation due to technological equipment failure or disruption; . Information transmitted may not be sufficient (e.g. poor resolution of images) to allow for appropriate medical decision making by the Practitioner; and/or  . In rare instances, security protocols could fail, causing a breach of personal health information.  Furthermore, I acknowledge that it is my responsibility to provide information about my medical history, conditions and care that is complete and accurate to the best of my ability. I acknowledge that Practitioner's advice, recommendations, and/or decision may be based on factors not within their control, such as incomplete or inaccurate data provided by me or lack of visual representation. I understand that the practice of medicine is not an exact science and that Practitioner makes no warranties or guarantees regarding treatment outcomes. I acknowledge that I will receive a copy of this consent concurrently upon execution via email to the email address I last provided but may also request a printed copy by calling the office of [facility].    I understand that my insurance may be billed for this visit.   I have read or had this consent read to me. . I understand the contents of this consent, which adequately explains the benefits and risks of the Services being provided via telemedicine.  . I have been  provided ample opportunity to ask questions regarding this consent and the Services and have had my questions  answered to my satisfaction. . I give my informed consent for the services to be provided through the use of telemedicine in my medical care  By participating in this telemedicine visit I agree to the above.

## 2018-08-01 NOTE — Telephone Encounter (Signed)
   TELEPHONE CALL NOTE  This patient has been deemed a candidate for follow-up tele-health visit to limit community exposure during the Covid-19 pandemic. I spoke with the patient via phone to discuss instructions. The patient was advised to review the section on consent for treatment as well. The patient will receive a phone call 2-3 days prior to their E-Visit at which time consent will be verbally confirmed. A Virtual Office Visit appointment type has been scheduled for 08/03/2018  with Clarisa Kindred FNP.  [name], CMA 08/01/2018 12:31 PM

## 2018-08-02 NOTE — Discharge Summary (Signed)
[facility] [place] [facility]   PATIENT NAME: Dana [name]    MR#:  [id number]  DATE OF BIRTH:  1946-02-16  DATE OF ADMISSION:  07/25/2018   ADMITTING PHYSICIAN: Barbaraann Rondo, MD  DATE OF DISCHARGE: 07/30/2018  3:08 PM  PRIMARY CARE PHYSICIAN: Patient, No Pcp Per   ADMISSION DIAGNOSIS:  Acute respiratory failure with hypoxia (HCC) [J96.01] Dyspnea, unspecified type [R06.00] Acute on chronic congestive heart failure, unspecified heart failure type (HCC) [I50.9] DISCHARGE DIAGNOSIS:  Active Problems:   Acute on chronic systolic CHF (congestive heart failure) (HCC)   Acute on chronic congestive heart failure (HCC)  SECONDARY DIAGNOSIS:   Past Medical History:  Diagnosis Date   CHF (congestive heart failure) (HCC)    Complication of anesthesia    "difficulty waking up, it was all day long"   Coronary artery disease    Hyperlipidemia    LBBB (left bundle branch block)    Psoriasis    HOSPITAL COURSE:  73 y.o. female with a hx of CADs/p10/2013 bypass surgery, HFrEF(EF less than 20%)/ ICM,and current smoker,admitted for exacerbation of  heart failure.  * Acute on chronic systolic CHF Severely reduced ejection fraction 20% on echocardiogram - due to ischemic cardiomyopathy given history of coronary disease and bypass surgery 8 years ago - started  Coreg, Lasix, spironolactone.  and the plan is to switch her from losartan to [facility] as an outpatient.  * History of CAD/ICM s/p CABG x2 in 2013,elevated troponin without chest pain History of coronary disease, bypass surgery 2013 Long history of smoking, medication noncompliance in the past 7 to 8 years/lost to follow-up - s/p cath on 3/30 showing significant two-vessel coronary artery disease involving proximal LAD at the bifurcation of first diagonal as well as mid left circumflex.  Patent grafts including LIMA to LAD and SVG to diagonal.  The LAD gets mostly antegrade flow but in  spite of that the LIMA is not atretic.  Left circumflex distribution area is medium in size. - Right heart catheterization showed mildly elevated filling pressures with pulmonary wedge pressure of 13 mmHg, mild pulmonary hypertension at 38 over 40 mmHg and severely reduced cardiac output at 3.13 with an index of 1.81. - Cardio recommended not to revascularize the LAD given that the LIMA is patent.  PCI of the left circumflex is reserved for refractory angina. - Currently optimizing medical therapy for chronic systolic heart failure.  * Medicationnoncompliance Stressed importance of following up with cardiology Reports that she felt well and has not followed up with cardiology in 7 years Now with decompensated heart failure, severely reduced EF, still smoking  *Tobacco abuse - Cessation advised Has been without for 5 days so far. Does not want a patch  * Hypothyroid -- TSH 6.872.  Outpatient management (has not seen PMD in a long time)  * COPD Long history of smoking Would continue to monitor ambulatory sats to see if home oxygen is needed  * Acute respiratory failure with hypoxia-secondary to underlying acute on chronic systolic CHF and also COPD. -now resolved. didn't qualify for O2  *  Essential hypertension-continue carvedilol, lasix, spironolactone, losartan  * Psoriasis-patient had extensive rash on her back and her elbows and arms.   topical steroids with clobetasol.  Explained to the patient that she needs to follow-up with dermatology as an outpatient. DISCHARGE CONDITIONS:  stable CONSULTS OBTAINED:  Treatment Team:  Barbaraann Rondo, MD Antonieta Iba, MD DRUG ALLERGIES:  No Known Allergies DISCHARGE MEDICATIONS:  Allergies as of 07/30/2018   No Known Allergies     Medication List    STOP taking these medications   aspirin 325 MG EC tablet Replaced by:  aspirin 81 MG chewable tablet   ferrous sulfate 325 (65 FE) MG tablet   lisinopril 10 MG  tablet Commonly known as:  PRINIVIL,ZESTRIL   nicotine 14 mg/24hr patch Commonly known as:  Nicoderm CQ     TAKE these medications   aspirin 81 MG chewable tablet Chew 1 tablet (81 mg total) by mouth daily. Replaces:  aspirin 325 MG EC tablet   atorvastatin 80 MG tablet Commonly known as:  LIPITOR Take 1 tablet (80 mg total) by mouth daily at 6 PM for 30 days.   budesonide 0.5 MG/2ML nebulizer solution Commonly known as:  PULMICORT Take 2 mLs (0.5 mg total) by nebulization 2 (two) times daily for 30 days.   carvedilol 3.125 MG tablet Commonly known as:  COREG Take 1 tablet (3.125 mg total) by mouth 2 (two) times daily with a meal. What changed:    medication strength  how much to take   feeding supplement (ENSURE ENLIVE) Liqd Take 237 mLs by mouth daily.   furosemide 40 MG tablet Commonly known as:  LASIX Take 1 tablet (40 mg total) by mouth daily.   losartan 25 MG tablet Commonly known as:  COZAAR Take 1 tablet (25 mg total) by mouth daily.   spironolactone 25 MG tablet Commonly known as:  ALDACTONE Take 1 tablet (25 mg total) by mouth daily.        DISCHARGE INSTRUCTIONS:   DIET:  Regular diet DISCHARGE CONDITION:  Good ACTIVITY:  Activity as tolerated OXYGEN:  Home Oxygen: No.  Oxygen Delivery: room air DISCHARGE LOCATION:  home   If you experience worsening of your admission symptoms, develop shortness of breath, life threatening emergency, suicidal or homicidal thoughts you must seek medical attention immediately by calling 911 or calling your MD immediately  if symptoms less severe.  You Must read complete instructions/literature along with all the possible adverse reactions/side effects for all the Medicines you take and that have been prescribed to you. Take any new Medicines after you have completely understood and accpet all the possible adverse reactions/side effects.   Please note  You were cared for by a hospitalist during your hospital  stay. If you have any questions about your discharge medications or the care you received while you were in the hospital after you are discharged, you can call the unit and asked to speak with the hospitalist on call if the hospitalist that took care of you is not available. Once you are discharged, your primary care physician will handle any further medical issues. Please note that NO REFILLS for any discharge medications will be authorized once you are discharged, as it is imperative that you return to your primary care physician (or establish a relationship with a primary care physician if you do not have one) for your aftercare needs so that they can reassess your need for medications and monitor your lab values.    On the day of Discharge:  VITAL SIGNS:  Blood pressure 120/61, pulse 75, temperature (!) 97.4 F (36.3 C), temperature source Oral, resp. rate 20, height 5\' 1"  (1.549 m), weight 69.7 kg, SpO2 98 %. PHYSICAL EXAMINATION:  GENERAL:  73 y.o.-year-old patient lying in the bed with no acute distress.  EYES: Pupils equal, round, reactive to light and accommodation. No scleral icterus. Extraocular muscles intact.  HEENT: Head atraumatic, normocephalic. Oropharynx and nasopharynx clear.  NECK:  Supple, no jugular venous distention. No thyroid enlargement, no tenderness.  LUNGS: Normal breath sounds bilaterally, no wheezing, rales,rhonchi or crepitation. No use of accessory muscles of respiration.  CARDIOVASCULAR: S1, S2 normal. No murmurs, rubs, or gallops.  ABDOMEN: Soft, non-tender, non-distended. Bowel sounds present. No organomegaly or mass.  EXTREMITIES: No pedal edema, cyanosis, or clubbing.  NEUROLOGIC: Cranial nerves II through XII are intact. Muscle strength 5/5 in all extremities. Sensation intact. Gait not checked.  PSYCHIATRIC: The patient is alert and oriented x 3.  SKIN: No obvious rash, lesion, or ulcer.  DATA REVIEW:   CBC Recent Labs  Lab 07/30/18 0000  WBC 11.0*    HGB 14.2  HCT 44.7  PLT 337    Chemistries  Recent Labs  Lab 07/30/18 0425  NA 138  K 4.1  CL 99  CO2 25  GLUCOSE 97  BUN 32*  CREATININE 0.88  CALCIUM 9.3     Follow-up Information    [facility] Follow up on 08/03/2018.   Specialty:  Cardiology Why:  at 10:00am This will be a virtual visit Contact information: [address] [place] [address] [phone]       Antonieta Iba, MD. Schedule an appointment as soon as possible for a visit on 08/08/2018.   Specialty:  Cardiology Why:  virtual appointment. patient needs to call to give consent. appointment @ 1:40pm Medical Mall: Please go to the medical mall for your labs in approximately 1 week after discharge. You will check in at the desk on the right.  Contact information: [address] [place] [address] [phone]           Management plans discussed with the patient, family and they are in agreement.  CODE STATUS: Prior   TOTAL TIME TAKING CARE OF THIS PATIENT: 45 minutes.    Delfino Lovett M.D on 08/02/2018 at 7:41 AM  Between 7am to 6pm - Pager - [phone]  After 6pm go to [email or web address] - Social research officer, government  [facility] [place] [facility]  Office  [phone]  CC: Primary care physician; Patient, No Pcp Per   Note: This dictation was prepared with Dragon dictation along with smaller phrase technology. Any transcriptional errors that result from this process are unintentional.

## 2018-08-03 ENCOUNTER — Encounter: Payer: Self-pay | Admitting: Family

## 2018-08-03 ENCOUNTER — Ambulatory Visit: Payer: Medicare Other | Attending: Family | Admitting: Family

## 2018-08-03 ENCOUNTER — Other Ambulatory Visit: Payer: Self-pay

## 2018-08-03 VITALS — BP 115/64 | HR 92 | Wt 154.0 lb

## 2018-08-03 DIAGNOSIS — I5022 Chronic systolic (congestive) heart failure: Secondary | ICD-10-CM

## 2018-08-03 DIAGNOSIS — Z720 Tobacco use: Secondary | ICD-10-CM

## 2018-08-03 MED ORDER — ALBUTEROL SULFATE HFA 108 (90 BASE) MCG/ACT IN AERS: 2.0000 | INHALATION_SPRAY | Freq: Four times a day (QID) | RESPIRATORY_TRACT | 2 refills | Status: DC | PRN

## 2018-08-03 NOTE — Patient Instructions (Addendum)
Continue weighing daily and call for an overnight weight gain of > 2 pounds or a weekly weight gain of >5 pounds.  Please call [facility] to schedule primary care appointment.    Low-Sodium Eating Plan Sodium, which is an element that makes up salt, helps you maintain a healthy balance of fluids in your body. Too much sodium can increase your blood pressure and cause fluid and waste to be held in your body. Your health care provider or dietitian may recommend following this plan if you have high blood pressure (hypertension), kidney disease, liver disease, or heart failure. Eating less sodium can help lower your blood pressure, reduce swelling, and protect your heart, liver, and kidneys. What are tips for following this plan? General guidelines  Most people on this plan should limit their sodium intake to 2,000 mg (milligrams) of sodium each day. Reading food labels   The Nutrition Facts label lists the amount of sodium in one serving of the food. If you eat more than one serving, you must multiply the listed amount of sodium by the number of servings.  Choose foods with less than 140 mg of sodium per serving.  Avoid foods with 300 mg of sodium or more per serving. Shopping  Look for lower-sodium products, often labeled as "low-sodium" or "no salt added."  Always check the sodium content even if foods are labeled as "unsalted" or "no salt added".  Buy fresh foods. ? Avoid canned foods and premade or frozen meals. ? Avoid canned, cured, or processed meats  Buy breads that have less than 80 mg of sodium per slice. Cooking  Eat more home-cooked food and less restaurant, buffet, and fast food.  Avoid adding salt when cooking. Use salt-free seasonings or herbs instead of table salt or sea salt. Check with your health care provider or pharmacist before using salt substitutes.  Cook with plant-based oils, such as canola, sunflower, or olive oil. Meal planning  When eating at a  restaurant, ask that your food be prepared with less salt or no salt, if possible.  Avoid foods that contain MSG (monosodium glutamate). MSG is sometimes added to [place] food, bouillon, and some canned foods. What foods are recommended? The items listed may not be a complete list. Talk with your dietitian about what dietary choices are best for you. Grains Low-sodium cereals, including oats, puffed wheat and rice, and shredded wheat. Low-sodium crackers. Unsalted rice. Unsalted pasta. Low-sodium bread. Whole-grain breads and whole-grain pasta. Vegetables Fresh or frozen vegetables. "No salt added" canned vegetables. "No salt added" tomato sauce and paste. Low-sodium or reduced-sodium tomato and vegetable juice. Fruits Fresh, frozen, or canned fruit. Fruit juice. Meats and other protein foods Fresh or frozen (no salt added) meat, poultry, seafood, and fish. Low-sodium canned tuna and salmon. Unsalted nuts. Dried peas, beans, and lentils without added salt. Unsalted canned beans. Eggs. Unsalted nut butters. Dairy Milk. Soy milk. Cheese that is naturally low in sodium, such as ricotta cheese, fresh mozzarella, or Swiss cheese Low-sodium or reduced-sodium cheese. Cream cheese. Yogurt. Fats and oils Unsalted butter. Unsalted margarine with no trans fat. Vegetable oils such as canola or olive oils. Seasonings and other foods Fresh and dried herbs and spices. Salt-free seasonings. Low-sodium mustard and ketchup. Sodium-free salad dressing. Sodium-free light mayonnaise. Fresh or refrigerated horseradish. Lemon juice. Vinegar. Homemade, reduced-sodium, or low-sodium soups. Unsalted popcorn and pretzels. Low-salt or salt-free chips. What foods are not recommended? The items listed may not be a complete list. Talk with your dietitian  about what dietary choices are best for you. Grains Instant hot cereals. Bread stuffing, pancake, and biscuit mixes. Croutons. Seasoned rice or pasta mixes. Noodle soup  cups. Boxed or frozen macaroni and cheese. Regular salted crackers. Self-rising flour. Vegetables Sauerkraut, pickled vegetables, and relishes. Olives. [place] fries. Onion rings. Regular canned vegetables (not low-sodium or reduced-sodium). Regular canned tomato sauce and paste (not low-sodium or reduced-sodium). Regular tomato and vegetable juice (not low-sodium or reduced-sodium). Frozen vegetables in sauces. Meats and other protein foods Meat or fish that is salted, canned, smoked, spiced, or pickled. Bacon, ham, sausage, hotdogs, corned beef, chipped beef, packaged lunch meats, salt pork, jerky, pickled herring, anchovies, regular canned tuna, sardines, salted nuts. Dairy Processed cheese and cheese spreads. Cheese curds. Blue cheese. Feta cheese. String cheese. Regular cottage cheese. Buttermilk. Canned milk. Fats and oils Salted butter. Regular margarine. Ghee. Bacon fat. Seasonings and other foods Onion salt, garlic salt, seasoned salt, table salt, and sea salt. Canned and packaged gravies. Worcestershire sauce. Tartar sauce. Barbecue sauce. Teriyaki sauce. Soy sauce, including reduced-sodium. Steak sauce. Fish sauce. Oyster sauce. Cocktail sauce. Horseradish that you find on the shelf. Regular ketchup and mustard. Meat flavorings and tenderizers. Bouillon cubes. Hot sauce and Tabasco sauce. Premade or packaged marinades. Premade or packaged taco seasonings. Relishes. Regular salad dressings. Salsa. Potato and tortilla chips. Corn chips and puffs. Salted popcorn and pretzels. Canned or dried soups. Pizza. Frozen entrees and pot pies. Summary  Eating less sodium can help lower your blood pressure, reduce swelling, and protect your heart, liver, and kidneys.  Most people on this plan should limit their sodium intake to 1,500-2,000 mg (milligrams) of sodium each day.  Canned, boxed, and frozen foods are high in sodium. Restaurant foods, fast foods, and pizza are also very high in sodium. You  also get sodium by adding salt to food.  Try to cook at home, eat more fresh fruits and vegetables, and eat less fast food, canned, processed, or prepared foods. This information is not intended to replace advice given to you by your health care provider. Make sure you discuss any questions you have with your health care provider. Document Released: 10/08/2001 Document Revised: 04/11/2016 Document Reviewed: 04/11/2016 Elsevier Interactive Patient Education  2019 [employer].

## 2018-08-03 NOTE — Progress Notes (Signed)
Virtual Visit via Telephone Note     Evaluation Performed:  Initial visit  This visit type was conducted due to national recommendations for restrictions regarding the COVID-19 Pandemic (e.g. social distancing).  This format is felt to be most appropriate for this patient at this time.  All issues noted in this document were discussed and addressed.  No physical exam was performed (except for noted visual exam findings with Video Visits).  Please refer to the patient's chart (MyChart message for video visits and phone note for telephone visits) for the patient's consent to telehealth for [facility]  Date:  08/03/2018   ID:  Dana [name], DOB 12/13/1945, MRN [id number]  Patient Location:  [address] [facility] [place] [address]   Provider location:   [facility] [address] [place], [place] [address]  PCP:  Patient, No Pcp Per  Cardiologist:  Julien Nordmann, MD  Electrophysiologist:  None   Chief Complaint: Shortness of breath  History of Present Illness:    Dana [name] is a 73 y.o. female who presents via audio/video conferencing for a telehealth visit today.  Patient verified her DOB and address.  The patient does not have symptoms concerning for COVID-19 infection (fever, chills, cough, or new SHORTNESS OF BREATH).   Patient reports minimal shortness of breath upon moderate exertion. She describes this as having been present for several weeks but has been improving. She has associated minimal fatigue, difficulty sleeping at times, runny nose, headache, leg soreness and dizziness along with this. She denies any weight gain, sore throat, cough, wheezing, chest pain, palpitations or swelling in her legs/ abdomen.   Prior CV studies:   The following studies were reviewed today:  Echo report from 07/26/2018 reviewed and showed an EF of <20% along with moderate MR and an elevated PA pressure of 37.7 mm Hg.   Catheterization done  07/30/2018 showed significant two-vessel CAD with patent grafts LIMA to LAD and SVG to diagonal.   Past Medical History:  Diagnosis Date  . CHF (congestive heart failure) (HCC)   . Complication of anesthesia    "difficulty waking up, it was all day long"  . Coronary artery disease   . Hyperlipidemia   . LBBB (left bundle branch block)   . Psoriasis    Past Surgical History:  Procedure Laterality Date  . CARDIAC CATHETERIZATION  02/17/2012  . CORONARY ARTERY BYPASS GRAFT  02/23/2012   Procedure: CORONARY ARTERY BYPASS GRAFTING (CABG);  Surgeon: Loreli Slot, MD;  Location[facility] OR;  Service: Open Heart Surgery;  Laterality: N/A;  coronary artery bypass graft on pump times two utlizing left internal mammary artery and right greater saphenous vein harvested endoscopically, transesophageal echocardiogram   . NO PAST SURGERIES    . RIGHT/LEFT HEART CATH AND CORONARY/GRAFT ANGIOGRAPHY N/A 07/30/2018   Procedure: RIGHT/LEFT HEART CATH AND CORONARY/GRAFT ANGIOGRAPHY;  Surgeon: Iran Ouch, MD;  Location: [facility] INVASIVE CV LAB;  Service: Cardiovascular;  Laterality: N/A;  . TEE WITHOUT CARDIOVERSION  02/22/2012   Procedure: TRANSESOPHAGEAL ECHOCARDIOGRAM (TEE);  Surgeon: Dolores Patty, MD;  Location: [facility] ENDOSCOPY;  Service: Cardiovascular;  Laterality: N/A;     Current Meds  Medication Sig  . aspirin 81 MG chewable tablet Chew 1 tablet (81 mg total) by mouth daily.  [name] atorvastatin (LIPITOR) 80 MG tablet Take 1 tablet (80 mg total) by mouth daily at 6 PM for 30 days.  . carvedilol (COREG) 3.125 MG tablet Take  1 tablet (3.125 mg total) by mouth 2 (two) times daily with a meal.  . furosemide (LASIX) 40 MG tablet Take 1 tablet (40 mg total) by mouth daily.  [name] losartan (COZAAR) 25 MG tablet Take 1 tablet (25 mg total) by mouth daily.  [name] spironolactone (ALDACTONE) 25 MG tablet Take 1 tablet (25 mg total) by mouth daily.     Allergies:   Patient has no known allergies.   Social  History   Tobacco Use  . Smoking status: Current Every Day Smoker    Last attempt to quit: 01/31/2012    Years since quitting: 6.5  . Smokeless tobacco: Never Used  . Tobacco comment: Smoked but cut down when started to get sick  Substance Use Topics  . Alcohol use: No  . Drug use: No     Family Hx: The patient's family history includes Heart disease in her father.  ROS:   Please see the history of present illness.     All other systems reviewed and are negative.   Labs/Other Tests and Data Reviewed:    Recent Labs: 07/25/2018: ALT 24; B Natriuretic Peptide 1,289.0; Magnesium 1.7 07/26/2018: TSH 1.523 07/30/2018: BUN 32; Creatinine, Ser 0.88; Hemoglobin 14.2; Platelets 337; Potassium 4.1; Sodium 138   Recent Lipid Panel Lab Results  Component Value Date/Time   CHOL 156 07/27/2018 04:17 AM   CHOL 123 02/17/2012 03:14 AM   TRIG 99 07/27/2018 04:17 AM   TRIG 99 02/17/2012 03:14 AM   HDL 49 07/27/2018 04:17 AM   HDL 30 (L) 02/17/2012 03:14 AM   CHOLHDL 3.2 07/27/2018 04:17 AM   LDLCALC 87 07/27/2018 04:17 AM   LDLCALC 73 02/17/2012 03:14 AM    Wt Readings from Last 3 Encounters:  08/03/18 154 lb (69.9 kg)  07/30/18 153 lb 10.6 oz (69.7 kg)  04/13/12 145 lb 12 oz (66.1 kg)     Exam:    Vital Signs:  BP 115/64 Comment: self-reported  Pulse 92 Comment: self-reported  Wt 154 lb (69.9 kg) Comment: self-reported  BMI 29.10 kg/m    Well nourished, well developed female in no  acute distress.   ASSESSMENT & PLAN:    1. Chronic heart failure with reduced ejection fraction- - NYHA class II - euvolemic per patient's description - weighing daily and reminded her to call for an overnight weight gain of >2 pounds or a weekly weight gain of >5 pounds - not adding salt and is trying to monitor her sodium intake. Will mail dietary information so that she can closely follow a low sodium diet - discussed titrating up carvedilol and switching her losartan to entresto in the  future - BNP 07/25/2018 was 1289.0 - she says that her nebulizer was too expensive ($500) and would like an inhaler. Will change to albuterol and explained that she needed to get established with a PCP for further treatment.   2: Tobacco use- - patient quit smoking while in the hospital and hasn't resumed - complete cessation discussed for 3 minutes with her - BMP from 07/30/2018 reviewed and showed sodium 138, potassium 4.1, creatinine 0.88 and GFR >60 - she is planning on calling [facility] to get a new patient appointment scheduled.   COVID-19 Education: The signs and symptoms of COVID-19 were discussed with the patient and how to seek care for testing (follow up with PCP or arrange E-visit).  The importance of social distancing was discussed today.  Patient Risk:   After full review of this patients clinical  status, I feel that they are at least moderate risk at this time.  Time:   Today, I have spent 17 minutes with the patient with telehealth technology discussing heart failure, medications, daily weights and symptoms to call about.     Medication Adjustments/Labs and Tests Ordered: Current medicines are reviewed at length with the patient today.  Concerns regarding medicines are outlined above.  Tests Ordered: No orders of the defined types were placed in this encounter.  Medication Changes: Meds ordered this encounter  Medications  . albuterol (PROVENTIL HFA;VENTOLIN HFA) 108 (90 Base) MCG/ACT inhaler    Sig: Inhale 2 puffs into the lungs every 6 (six) hours as needed for wheezing or shortness of breath.    Dispense:  1 Inhaler    Refill:  2    Disposition: Follow-up in 1 month or sooner for any questions/problems before then.   Signed, Delma Freeze, FNP  08/03/2018 10:18 AM    [facility]

## 2018-08-06 NOTE — Progress Notes (Signed)
Telephone Visit     Evaluation Performed:  Follow-up visit  This visit type was conducted due to national recommendations for restrictions regarding the COVID-19 Pandemic (e.g. social distancing).  This format is felt to be most appropriate for this patient at this time.  All issues noted in this document were discussed and addressed.  No physical exam was performed (except for noted visual exam findings with Telehealth visits).  See MyChart message from today for the patient's consent to telehealth for [facility].  Date:  08/07/2018   ID:  Dana [name], DOB 07/02/1945, MRN [id number]  Patient Location:  [address] [place] [id number]   Provider location:   [facility], [place] office  PCP:  Patient, No Pcp Per  Cardiologist:  Fonnie MuGollan, [facility]  Chief Complaint:  Cardiomyopathy, tired   History of Present Illness:    Dana PullerGloria Henion is a 73 y.o. female who presents via audio/video conferencing for a telehealth visit today.   The patient does not symptoms concerning for COVID-19 infection (fever, chills, cough, or new SHORTNESS OF BREATH).   Patient has a past medical history of  CADs/p10/2013 bypass surgery,  HFrEF(EF less than 20%)/ ICM,  current smoker, Admission to the hospital March 2020 with acute on chronic systolic CHF, shortness of breath with known heart failure.  Echocardiogram in the hospital showing ejection fraction less than 20%  Cardiac catheterization July 30, 2019 1.  Significant two-vessel coronary artery disease involving proximal LAD at the bifurcation of first diagonal as well as mid left circumflex.  Patent grafts including LIMA to LAD and SVG to diagonal.  The LAD gets mostly antegrade flow but in spite of that the LIMA is not atretic.  Left circumflex distribution area is medium in size.  Since she has been home Breath ok  Weight 154 pounds Stable since home 122/67 Pulse 92  Using an inhaler  Rare  vertigo, Lasts 5 to 6 sec   Prior CV studies:   The following studies were reviewed today:  TTE 07/26/2018 1. The left ventricle has severely reduced systolic function, with an ejection fraction <20%. The cavity size was severely dilated. Global hypokinesis, Concern for severe hypokinesis/possible akinesis of the anterior/anterseptal wall/apical. Unable to  determine diastolic parameters 2. The right ventricle has mildly reduced systolic function. The cavity was normal. There is no increase in right ventricular wall thickness. Right ventricular systolic pressure is mildly elevated with an estimated pressure of 37.7 mmHg. 3. The aortic valve is grossly normal Moderate calcification of the aortic valve. Aortic valve regurgitation was not assessed by color flow Doppler. 4. Mitral valve regurgitation is moderate 5. Left atrial size was moderately dilated.  Cath 07/30/2018  Mid LM to Dist LM lesion is 20% stenosed.  Prox LAD lesion is 80% stenosed.  Ost 1st Diag lesion is 80% stenosed.  Mid Cx lesion is 80% stenosed.  SVG and is normal in caliber.  The graft exhibits no disease.  LIMA and is normal in caliber.  The graft exhibits no disease.   1.  Significant two-vessel coronary artery disease involving proximal LAD at the bifurcation of first diagonal as well as mid left circumflex.  Patent grafts including LIMA to LAD and SVG to diagonal.  The LAD gets mostly antegrade flow but in spite of that the LIMA is not atretic.  Left circumflex distribution area is medium in size. 2.  Right heart catheterization showed mildly elevated filling pressures with pulmonary wedge pressure  of 13 mmHg, mild pulmonary hypertension at 38 over 40 mmHg and severely reduced cardiac output at 3.13 with an index of 1.81.  Recommendations: No need to revascularize the LAD given that the LIMA is patent.   PCI of the left circumflex is possible but I doubt it will make significant difference on ejection  fraction.  This should be reserved for refractory angina.   Past Medical History:  Diagnosis Date  . CHF (congestive heart failure) (HCC)   . Complication of anesthesia    "difficulty waking up, it was all day long"  . Coronary artery disease   . Hyperlipidemia   . LBBB (left bundle branch block)   . Psoriasis    Past Surgical History:  Procedure Laterality Date  . CARDIAC CATHETERIZATION  02/17/2012  . CORONARY ARTERY BYPASS GRAFT  02/23/2012   Procedure: CORONARY ARTERY BYPASS GRAFTING (CABG);  Surgeon: Loreli Slot, MD;  [facility];  Service: Open Heart Surgery;  Laterality: N/A;  coronary artery bypass graft on pump times two utlizing left internal mammary artery and right greater saphenous vein harvested endoscopically, transesophageal echocardiogram   . NO PAST SURGERIES    . RIGHT/LEFT HEART CATH AND CORONARY/GRAFT ANGIOGRAPHY N/A 07/30/2018   Procedure: RIGHT/LEFT HEART CATH AND CORONARY/GRAFT ANGIOGRAPHY;  Surgeon: Iran Ouch, MD;  Location: [facility] INVASIVE CV LAB;  Service: Cardiovascular;  Laterality: N/A;  . TEE WITHOUT CARDIOVERSION  02/22/2012   Procedure: TRANSESOPHAGEAL ECHOCARDIOGRAM (TEE);  Surgeon: Dolores Patty, MD;  Location: [facility] ENDOSCOPY;  Service: Cardiovascular;  Laterality: N/A;     Current Meds  Medication Sig  . albuterol (PROVENTIL HFA;VENTOLIN HFA) 108 (90 Base) MCG/ACT inhaler Inhale 2 puffs into the lungs every 6 (six) hours as needed for wheezing or shortness of breath.  [name] aspirin 81 MG chewable tablet Chew 1 tablet (81 mg total) by mouth daily.  [name] atorvastatin (LIPITOR) 80 MG tablet Take 1 tablet (80 mg total) by mouth daily at 6 PM for 30 days.  . carvedilol (COREG) 3.125 MG tablet Take 1 tablet (3.125 mg total) by mouth 2 (two) times daily with a meal.  . feeding supplement, ENSURE ENLIVE, (ENSURE ENLIVE) LIQD Take 237 mLs by mouth daily.  . furosemide (LASIX) 40 MG tablet Take 1 tablet (40 mg total) by mouth daily.  [name] losartan  (COZAAR) 25 MG tablet Take 1 tablet (25 mg total) by mouth daily.  [name] spironolactone (ALDACTONE) 25 MG tablet Take 1 tablet (25 mg total) by mouth daily.     Allergies:   Patient has no known allergies.    Social History   Tobacco Use  . Smoking status: Current Every Day Smoker    Last attempt to quit: 01/31/2012    Years since quitting: 6.5  . Smokeless tobacco: Never Used  . Tobacco comment: Smoked but cut down when started to get sick  Substance Use Topics  . Alcohol use: No  . Drug use: No     Current Outpatient Medications on File Prior to Visit  Medication Sig Dispense Refill  . albuterol (PROVENTIL HFA;VENTOLIN HFA) 108 (90 Base) MCG/ACT inhaler Inhale 2 puffs into the lungs every 6 (six) hours as needed for wheezing or shortness of breath. 1 Inhaler 2  . aspirin 81 MG chewable tablet Chew 1 tablet (81 mg total) by mouth daily. 30 tablet 0  . atorvastatin (LIPITOR) 80 MG tablet Take 1 tablet (80 mg total) by mouth daily at 6 PM for 30 days. 30 tablet 0  .  carvedilol (COREG) 3.125 MG tablet Take 1 tablet (3.125 mg total) by mouth 2 (two) times daily with a meal. 60 tablet 0  . feeding supplement, ENSURE ENLIVE, (ENSURE ENLIVE) LIQD Take 237 mLs by mouth daily. 237 mL 12  . furosemide (LASIX) 40 MG tablet Take 1 tablet (40 mg total) by mouth daily. 30 tablet 0  . losartan (COZAAR) 25 MG tablet Take 1 tablet (25 mg total) by mouth daily. 30 tablet 0  . spironolactone (ALDACTONE) 25 MG tablet Take 1 tablet (25 mg total) by mouth daily. 30 tablet 0   No current facility-administered medications on file prior to visit.      Family Hx: The patient's family history includes Heart disease in her father.  ROS:   Please see the history of present illness.    Review of Systems  Constitutional: Negative.   Respiratory: Negative.   Cardiovascular: Negative.   Gastrointestinal: Negative.   Musculoskeletal: Negative.   Neurological: Negative.   Psychiatric/Behavioral: Negative.    All other systems reviewed and are negative.    Labs/Other Tests and Data Reviewed:    Recent Labs: 07/25/2018: ALT 24; B Natriuretic Peptide 1,289.0; Magnesium 1.7 07/26/2018: TSH 1.523 07/30/2018: BUN 32; Creatinine, Ser 0.88; Hemoglobin 14.2; Platelets 337; Potassium 4.1; Sodium 138   Recent Lipid Panel Lab Results  Component Value Date/Time   CHOL 156 07/27/2018 04:17 AM   CHOL 123 02/17/2012 03:14 AM   TRIG 99 07/27/2018 04:17 AM   TRIG 99 02/17/2012 03:14 AM   HDL 49 07/27/2018 04:17 AM   HDL 30 (L) 02/17/2012 03:14 AM   CHOLHDL 3.2 07/27/2018 04:17 AM   LDLCALC 87 07/27/2018 04:17 AM   LDLCALC 73 02/17/2012 03:14 AM    Wt Readings from Last 3 Encounters:  08/03/18 154 lb (69.9 kg)  07/30/18 153 lb 10.6 oz (69.7 kg)  04/13/12 145 lb 12 oz (66.1 kg)     Exam:    Vital Signs: Vital signs as detailed above in HPI  Well nourished, well developed female in no acute distress. Constitutional:  oriented to person, place, and time. No distress.     ASSESSMENT & PLAN:    Acute on chronic systolic CHF Severely reduced ejection fraction 20% on echocardiogram We will stop losartan Start entresto 24/26/po BID   History of CAD/ICM s/p CABG x2 in 2013,elevated troponin without chest pain History of coronary disease, bypass surgery 2013 Long history of smoking, medication noncompliance in the past 7 to 8 years/lost to   Tobacco abuse We have encouraged her to continue to work on weaning her cigarettes and smoking cessation. She will continue to work on this and does not want any assistance with chantix.   Hypothyroid -- TSH 6.872.  Outpatient management (has not seen PMD in a long time)  COPD Long history of smoking No oxygen at home   COVID-19 Education: The signs and symptoms of COVID-19 were discussed with the patient and how to seek care for testing (follow up with PCP or arrange E-visit).  The importance of social distancing was discussed today.  Patient  Risk:   After full review of this patients clinical status, I feel that they are at least moderate risk at this time.  Time:   Today, I have spent 25 minutes with the patient with telehealth technology discussing  systolic CHF, salt intake, moderating fluid intake, daily weights Current weight 254 pounds, stable and feels relatively well   Medication Adjustments/Labs and Tests Ordered: Current medicines are reviewed at  length with the patient today.  Concerns regarding medicines are outlined above.   Tests Ordered: No tests ordered   Medication Changes: Medications as detailed above   Disposition: Follow-up in 1 month   Signed, Julien Nordmann, MD  08/07/2018 1:27 PM    [facility] [address], [place], [place] [address]

## 2018-08-07 ENCOUNTER — Telehealth (INDEPENDENT_AMBULATORY_CARE_PROVIDER_SITE_OTHER): Payer: Medicare Other | Admitting: Cardiovascular Disease

## 2018-08-07 ENCOUNTER — Other Ambulatory Visit: Payer: Self-pay

## 2018-08-07 DIAGNOSIS — I255 Ischemic cardiomyopathy: Secondary | ICD-10-CM

## 2018-08-07 DIAGNOSIS — I5022 Chronic systolic (congestive) heart failure: Secondary | ICD-10-CM

## 2018-08-07 DIAGNOSIS — J432 Centrilobular emphysema: Secondary | ICD-10-CM

## 2018-08-07 DIAGNOSIS — I25118 Atherosclerotic heart disease of native coronary artery with other forms of angina pectoris: Secondary | ICD-10-CM

## 2018-08-07 DIAGNOSIS — Z720 Tobacco use: Secondary | ICD-10-CM

## 2018-08-07 MED ORDER — SACUBITRIL-VALSARTAN 24-26 MG PO TABS: 1.0000 | ORAL_TABLET | Freq: Two times a day (BID) | ORAL | 11 refills | Status: DC

## 2018-08-07 NOTE — Telephone Encounter (Signed)
Left voicemail message for patient to call back to review medications, vital signs, allergies, and to see if video visit is available with her.

## 2018-08-07 NOTE — Patient Instructions (Addendum)
Please call [phone] and they will be able to provide you with a list of available primary care providers.   Medication Instructions:  Your physician has recommended you make the following change in your medication:  1. START Entresto 24/26 mg one tablet twice a day When you get the Entresto STOP the Losartan  If you need a refill on your cardiac medications before your next appointment, please call your pharmacy.   Lab work: No new labs needed   If you have labs (blood work) drawn today and your tests are completely normal, you will receive your results only by: [name] MyChart Message (if you have MyChart) OR . A paper copy in the mail If you have any lab test that is abnormal or we need to change your treatment, we will call you to review the results.   Testing/Procedures: No new testing needed   Follow-Up: At [facility], you and your health needs are our priority.  As part of our continuing mission to provide you with exceptional heart care, we have created designated Provider Care Teams.  These Care Teams include your primary Cardiologist (physician) and Advanced Practice Providers (APPs -  Physician Assistants and Nurse Practitioners) who all work together to provide you with the care you need, when you need it.  . You will need a follow up appointment in 1 month  . Providers on your designated Care Team:   . Nicolasa Ducking, NP . Eula Listen, PA-C  . Marisue Ivan, PA-C  Any Other Special Instructions Will Be Listed Below (If Applicable).  For educational health videos Log in to : [email or web address] Or : [email or web address], password : [email or web address]

## 2018-08-07 NOTE — Progress Notes (Signed)
No answer. No voicemail. Will try again later. 

## 2018-08-07 NOTE — Telephone Encounter (Signed)
Called and someone answered and then hung up. Called back and left voicemail message that we are trying to review her information for appointment today.

## 2018-08-08 ENCOUNTER — Telehealth: Payer: Self-pay

## 2018-08-08 ENCOUNTER — Other Ambulatory Visit: Payer: Self-pay

## 2018-08-08 ENCOUNTER — Telehealth: Payer: Medicare Other | Admitting: Cardiovascular Disease

## 2018-08-08 MED ORDER — SACUBITRIL-VALSARTAN 24-26 MG PO TABS: 1.0000 | ORAL_TABLET | Freq: Two times a day (BID) | ORAL | 11 refills | Status: DC

## 2018-08-08 NOTE — Progress Notes (Signed)
Spoke with patient.  Scheduled her for a 1 month follow up.  Scheduled for 09/10/2018 @ 9:40am  Patient also wanted me to resent Entrest[facility] to [facility] in [place] so she could get it through the drive thru.  Made patient aware that I have resent the medication to [facility] and we look forward to talking with her on 09/10/18

## 2018-08-08 NOTE — Telephone Encounter (Signed)
[name] contacted the office stating she had been started on a new medicine. Entresto. She states that it is a $500 copay and she can not afford that kind of cost. She does not currently have drug prescription coverage.   It looks like she was started on the Entresto by Dossie Arbour MD. Patient was not provided with 30 Day voucher. I advised patient that we could call in voucher to [facility]. We will also initiate an application for the [employer] Patient Assistance Program. I advised MS. Constantino that the application will be mailed to her for a signature and she will need to mail it to the program after signing. I will include a per addressed and stamped envelope for her convience.   [facility] was give Voucher information and copay reduced to $0 for a 30 day supply.  Voucher information as followsRichrd Humbles: [id number]          RXGRP: [id number] RXPCN: [id number]            ISSUER: [id number] ID: [id number]   [name] was advised to pick up Medication from [facility] in [place].

## 2018-08-17 DIAGNOSIS — I25118 Atherosclerotic heart disease of native coronary artery with other forms of angina pectoris: Secondary | ICD-10-CM | POA: Diagnosis not present

## 2018-08-17 DIAGNOSIS — I255 Ischemic cardiomyopathy: Secondary | ICD-10-CM | POA: Diagnosis not present

## 2018-08-17 DIAGNOSIS — I5022 Chronic systolic (congestive) heart failure: Secondary | ICD-10-CM

## 2018-08-17 DIAGNOSIS — J432 Centrilobular emphysema: Secondary | ICD-10-CM

## 2018-08-17 DIAGNOSIS — Z720 Tobacco use: Secondary | ICD-10-CM

## 2018-08-27 ENCOUNTER — Telehealth: Payer: Self-pay

## 2018-08-27 NOTE — Telephone Encounter (Signed)
TELEPHONE CALL NOTE  Dana [name] has been deemed a candidate for a follow-up tele-health visit to limit community exposure during the Covid-19 pandemic. I spoke with the patient via phone to ensure availability of phone/video source, confirm preferred email & phone number, discuss instructions and expectations, and review consent.   I reminded Dana [name] to be prepared with any vital sign and/or heart rhythm information that could potentially be obtained via home monitoring, at the time of her visit.  Finally, I reminded Dana [name] to expect an e-mail containing a link for their video-based visit approximately 15 minutes before her visit, or alternatively, a phone call at the time of her visit if her visit is planned to be a phone encounter.  Did the patient verbally consent to treatment as below? YES  [name], CMA 08/27/2018 10:22 AM  CONSENT FOR TELE-HEALTH VISIT - PLEASE REVIEW  I hereby voluntarily request, consent and authorize [facility] and its employed or contracted physicians, physician assistants, nurse practitioners or other licensed health care professionals (the Practitioner), to provide me with telemedicine health care services (the "Services") as deemed necessary by the treating Practitioner. I acknowledge and consent to receive the Services by the Practitioner via telemedicine. I understand that the telemedicine visit will involve communicating with the Practitioner through telephonic communication technology and the disclosure of certain medical information by electronic transmission. I acknowledge that I have been given the opportunity to request an in-person assessment or other available alternative prior to the telemedicine visit and am voluntarily participating in the telemedicine visit.  I understand that I have the right to withhold or withdraw my consent to the use of telemedicine in the course of my care at any time, without  affecting my right to future care or treatment, and that the Practitioner or I may terminate the telemedicine visit at any time. I understand that I have the right to inspect all information obtained and/or recorded in the course of the telemedicine visit and may receive copies of available information for a reasonable fee.  I understand that some of the potential risks of receiving the Services via telemedicine include:  [name] Delay or interruption in medical evaluation due to technological equipment failure or disruption; . Information transmitted may not be sufficient (e.g. poor resolution of images) to allow for appropriate medical decision making by the Practitioner; and/or  . In rare instances, security protocols could fail, causing a breach of personal health information.  Furthermore, I acknowledge that it is my responsibility to provide information about my medical history, conditions and care that is complete and accurate to the best of my ability. I acknowledge that Practitioner's advice, recommendations, and/or decision may be based on factors not within their control, such as incomplete or inaccurate data provided by me or lack of visual representation. I understand that the practice of medicine is not an exact science and that Practitioner makes no warranties or guarantees regarding treatment outcomes. I acknowledge that I will receive a copy of this consent concurrently upon execution via email to the email address I last provided but may also request a printed copy by calling the office of [facility].    I understand that my insurance may be billed for this visit.   I have read or had this consent read to me. . I understand the contents of this consent, which adequately explains the benefits and risks of the Services being provided via telemedicine.  . I have been  provided ample opportunity to ask questions regarding this consent and the Services and have had my questions  answered to my satisfaction. . I give my informed consent for the services to be provided through the use of telemedicine in my medical care  By participating in this telemedicine visit I agree to the above.

## 2018-08-27 NOTE — Telephone Encounter (Signed)
   TELEPHONE CALL NOTE  This patient has been deemed a candidate for follow-up tele-health visit to limit community exposure during the Covid-19 pandemic. I spoke with the patient via phone to discuss instructions. The patient was advised to review the section on consent for treatment as well. The patient will receive a phone call 2-3 days prior to their E-Visit at which time consent will be verbally confirmed. A Virtual Office Visit appointment type has been scheduled for 08/28/2018 with Clarisa Kindred FNP.  Nira Retort L, CMA 08/27/2018 10:21 AM

## 2018-08-28 ENCOUNTER — Ambulatory Visit: Payer: Medicare Other | Attending: Family | Admitting: Family

## 2018-08-28 ENCOUNTER — Other Ambulatory Visit: Payer: Self-pay

## 2018-08-28 ENCOUNTER — Encounter: Payer: Self-pay | Admitting: Family

## 2018-08-28 VITALS — BP 107/68 | HR 94 | Wt 154.0 lb

## 2018-08-28 DIAGNOSIS — Z720 Tobacco use: Secondary | ICD-10-CM

## 2018-08-28 DIAGNOSIS — I5022 Chronic systolic (congestive) heart failure: Secondary | ICD-10-CM

## 2018-08-28 MED ORDER — FUROSEMIDE 40 MG PO TABS: 40.0000 mg | ORAL_TABLET | Freq: Every day | ORAL | 5 refills | Status: DC

## 2018-08-28 MED ORDER — ATORVASTATIN CALCIUM 80 MG PO TABS: 80.0000 mg | ORAL_TABLET | Freq: Every day | ORAL | 5 refills | Status: DC

## 2018-08-28 MED ORDER — CARVEDILOL 3.125 MG PO TABS: 3.1250 mg | ORAL_TABLET | Freq: Two times a day (BID) | ORAL | 5 refills | Status: DC

## 2018-08-28 MED ORDER — SPIRONOLACTONE 25 MG PO TABS: 25.0000 mg | ORAL_TABLET | Freq: Every day | ORAL | 5 refills | Status: DC

## 2018-08-28 MED ORDER — ASPIRIN 81 MG PO CHEW: 81.0000 mg | CHEWABLE_TABLET | Freq: Every day | ORAL | 5 refills | Status: AC

## 2018-08-28 MED ORDER — ALBUTEROL SULFATE HFA 108 (90 BASE) MCG/ACT IN AERS: 2.0000 | INHALATION_SPRAY | Freq: Four times a day (QID) | RESPIRATORY_TRACT | 5 refills | Status: DC | PRN

## 2018-08-28 NOTE — Progress Notes (Signed)
Virtual Visit via Telephone Note    Evaluation Performed:  Initial visit  This visit type was conducted due to national recommendations for restrictions regarding the COVID-19 Pandemic (e.g. social distancing).  This format is felt to be most appropriate for this patient at this time.  All issues noted in this document were discussed and addressed.  No physical exam was performed (except for noted visual exam findings with Video Visits).  Please refer to the patient's chart (MyChart message for video visits and phone note for telephone visits) for the patient's consent to telehealth for [facility]  Date:  08/28/2018   ID:  Dana [name], DOB Jun 17, 1945, MRN [id number]  Patient Location:  [address] [facility] [place] [address]   Provider location:   [facility] [address] [place], [place] [address]  PCP:  Patient, No Pcp Per  Cardiologist:  Julien Nordmann, MD Electrophysiologist:  None   Chief Complaint:  Minimal shortness of breath  History of Present Illness:    Dana [name] is a 73 y.o. female who presents via audio/video conferencing for a telehealth visit today.  Patient verified DOB and address.  The patient does not have symptoms concerning for COVID-19 infection (fever, chills, cough, or new SHORTNESS OF BREATH).   Patient reports minimal shortness of breath upon moderate exertion. She describes this as having been present for several months although is getting quite better. She has associated fatigue and difficulty sleeping at times. She denies any sore throat, cough, chest pain, palpitations, pedal edema, abdominal distention, dizziness or weight gain. She is trying to remain active but gets leg pain when she is on her feet for too long. She hasn't been smoking for about 2 months and seems to be doing well with that. She had some confusion regarding her medications and had continued to take losartan along with entresto as she  thought the entresto replaced her carvedilol. Is in the process of getting established with a PCP.   Prior CV studies:   The following studies were reviewed today:  Echo report from 07/26/2018 reviewed and showed an EF of <20% along with moderate MR.  Cardiac catheterization showed significant two-vessel disease in proximal LAD but was not revascularized due to a patent LIMA.   Past Medical History:  Diagnosis Date  . CHF (congestive heart failure) (HCC)   . Complication of anesthesia    "difficulty waking up, it was all day long"  . Coronary artery disease   . Hyperlipidemia   . LBBB (left bundle branch block)   . Psoriasis    Past Surgical History:  Procedure Laterality Date  . CARDIAC CATHETERIZATION  02/17/2012  . CORONARY ARTERY BYPASS GRAFT  02/23/2012   Procedure: CORONARY ARTERY BYPASS GRAFTING (CABG);  Surgeon: Loreli Slot, MD;  Location: [facility] OR;  Service: Open Heart Surgery;  Laterality: N/A;  coronary artery bypass graft on pump times two utlizing left internal mammary artery and right greater saphenous vein harvested endoscopically, transesophageal echocardiogram   . NO PAST SURGERIES    . RIGHT/LEFT HEART CATH AND CORONARY/GRAFT ANGIOGRAPHY N/A 07/30/2018   Procedure: RIGHT/LEFT HEART CATH AND CORONARY/GRAFT ANGIOGRAPHY;  Surgeon: Iran Ouch, MD;  Location: [facility] INVASIVE CV LAB;  Service: Cardiovascular;  Laterality: N/A;  . TEE WITHOUT CARDIOVERSION  02/22/2012   Procedure: TRANSESOPHAGEAL ECHOCARDIOGRAM (TEE);  Surgeon: Dolores Patty, MD;  Location: [facility] ENDOSCOPY;  Service: Cardiovascular;  Laterality: N/A;     Current Meds  Medication Sig  . aspirin 81 MG chewable tablet Chew 1 tablet (81 mg total) by mouth daily.  [name] atorvastatin (LIPITOR) 80 MG tablet Take 1 tablet (80 mg total) by mouth daily at 6 PM for 30 days.  . feeding supplement, ENSURE ENLIVE, (ENSURE ENLIVE) LIQD Take 237 mLs by mouth daily.  . furosemide (LASIX) 40 MG tablet Take 1  tablet (40 mg total) by mouth daily.  [name] losartan (COZAAR) 25 MG tablet Take 1 tablet (25 mg total) by mouth daily.  . sacubitril-valsartan (ENTRESTO) 24-26 MG Take 1 tablet by mouth 2 (two) times daily.  [name] spironolactone (ALDACTONE) 25 MG tablet Take 1 tablet (25 mg total) by mouth daily.     Allergies:   Patient has no known allergies.   Social History   Tobacco Use  . Smoking status: Current Every Day Smoker    Last attempt to quit: 01/31/2012    Years since quitting: 6.5  . Smokeless tobacco: Never Used  . Tobacco comment: Smoked but cut down when started to get sick  Substance Use Topics  . Alcohol use: No  . Drug use: No     Family Hx: The patient's family history includes Heart disease in her father.  ROS:   Please see the history of present illness.     All other systems reviewed and are negative.   Labs/Other Tests and Data Reviewed:    Recent Labs: 07/25/2018: ALT 24; B Natriuretic Peptide 1,289.0; Magnesium 1.7 07/26/2018: TSH 1.523 07/30/2018: BUN 32; Creatinine, Ser 0.88; Hemoglobin 14.2; Platelets 337; Potassium 4.1; Sodium 138   Recent Lipid Panel Lab Results  Component Value Date/Time   CHOL 156 07/27/2018 04:17 AM   CHOL 123 02/17/2012 03:14 AM   TRIG 99 07/27/2018 04:17 AM   TRIG 99 02/17/2012 03:14 AM   HDL 49 07/27/2018 04:17 AM   HDL 30 (L) 02/17/2012 03:14 AM   CHOLHDL 3.2 07/27/2018 04:17 AM   LDLCALC 87 07/27/2018 04:17 AM   LDLCALC 73 02/17/2012 03:14 AM    Wt Readings from Last 3 Encounters:  08/28/18 154 lb (69.9 kg)  08/03/18 154 lb (69.9 kg)  07/30/18 153 lb 10.6 oz (69.7 kg)     Exam:    Vital Signs:  BP 107/68 Comment: self-reported  Pulse 94 Comment: self-reported  Wt 154 lb (69.9 kg) Comment: self-reported  BMI 29.10 kg/m    Well nourished, well developed female in no  acute distress.   ASSESSMENT & PLAN:    1.Chronic heart failure with reduced ejection fraction- - NYHA class II - euvolemic today based on patient's  description of symptoms - weighing daily and she says that her weight has been stable. Reminded to call for an overnight weight gain of  >2 pounds or a weekly weight gain of >5 pounds - adds "very little" salt to her food. Encouraged her to not add any salt to her food. Has been reading food labels for sodium content.  - clarified that she should stop taking losartan, continue entresto and resume carvedilol. Explained the uses of the medications and patient verbalized understanding of these instructions. New prescriptions were sent to her pharmacy.  - hope to titrate up entresto/ carvedilol in the future  2: Tobacco use-   - she says that she stopped smoking ~ 2 months ago and reports doing well with that - continued cessation discussed for 3 minutes with her  COVID-19 Education: The signs and symptoms of COVID-19 were discussed with the patient and how  to seek care for testing (follow up with PCP or arrange E-visit).  The importance of social distancing was discussed today.  Patient Risk:   After full review of this patients clinical status, I feel that they are at least moderate risk at this time.  Time:   Today, I have spent 17 minutes with the patient with telehealth technology discussing medications, daily weight and symptoms to report.     Medication Adjustments/Labs and Tests Ordered: Current medicines are reviewed at length with the patient today.  Concerns regarding medicines are outlined above.   Tests Ordered: No orders of the defined types were placed in this encounter.  Medication Changes: No orders of the defined types were placed in this encounter.   Disposition:  Follow-up in 1 month or sooner for any questions/problems before then.   Signed, Delma Freezeina A Fredericka Bottcher, FNP  08/28/2018 11:09 AM    [facility]

## 2018-08-28 NOTE — Patient Instructions (Addendum)
Continue weighing daily and call for an overnight weight gain of > 2 pounds or a weekly weight gain of >5 pounds. 

## 2018-09-05 ENCOUNTER — Telehealth: Payer: Self-pay

## 2018-09-05 NOTE — Telephone Encounter (Signed)
Virtual Visit Pre-Appointment Phone Call  "(Name), I am calling you today to discuss your upcoming appointment. We are currently trying to limit exposure to the virus that causes COVID-19 by seeing patients at home rather than in the office."  1. "What is the BEST phone number to call the day of the visit?" - include this in appointment notes  2. "Do you have or have access to (through a family member/friend) a smartphone with video capability that we can use for your visit?" a. If yes - list this number in appt notes as "cell" (if different from BEST phone #) and list the appointment type as a VIDEO visit in appointment notes b. If no - list the appointment type as a PHONE visit in appointment notes  3. Confirm consent - "In the setting of the current Covid19 crisis, you are scheduled for a (phone or video) visit with your provider on (date) at (time).  Just as we do with many in-office visits, in order for you to participate in this visit, we must obtain consent.  If you'd like, I can send this to your mychart (if signed up) or email for you to review.  Otherwise, I can obtain your verbal consent now.  All virtual visits are billed to your insurance company just like a normal visit would be.  By agreeing to a virtual visit, we'd like you to understand that the technology does not allow for your provider to perform an examination, and thus may limit your provider's ability to fully assess your condition. If your provider identifies any concerns that need to be evaluated in person, we will make arrangements to do so.  Finally, though the technology is pretty good, we cannot assure that it will always work on either your or our end, and in the setting of a video visit, we may have to convert it to a phone-only visit.  In either situation, we cannot ensure that we have a secure connection.  Are you willing to proceed?" STAFF: Did the patient verbally acknowledge consent to telehealth visit? Document  YES/NO here: YES  4. Advise patient to be prepared - "Two hours prior to your appointment, go ahead and check your blood pressure, pulse, oxygen saturation, and your weight (if you have the equipment to check those) and write them all down. When your visit starts, your provider will ask you for this information. If you have an Apple Watch or Kardia device, please plan to have heart rate information ready on the day of your appointment. Please have a pen and paper handy nearby the day of the visit as well."  5. Give patient instructions for MyChart download to smartphone OR [email or web address] as below if video visit (depending on what platform provider is using)  6. Inform patient they will receive a phone call 15 minutes prior to their appointment time (may be from unknown caller ID) so they should be prepared to answer    TELEPHONE CALL NOTE  Dana [name] has been deemed a candidate for a follow-up tele-health visit to limit community exposure during the Covid-19 pandemic. I spoke with the patient via phone to ensure availability of phone/video source, confirm preferred email & phone number, and discuss instructions and expectations.  I reminded Dana [name] to be prepared with any vital sign and/or heart rhythm information that could potentially be obtained via home monitoring, at the time of her visit. I reminded Dana [name] to expect a phone call prior to her visit.  [name], [place] 09/05/2018 10:24 AM   INSTRUCTIONS FOR DOWNLOADING THE MYCHART APP TO SMARTPHONE  - The patient must first make sure to have activated MyChart and know their login information - If Apple, go to [employer] and type in MyChart in the search bar and download the app. If Android, ask patient to go to [employer] and type in [place] in the search bar and download the app. The app is free but as with any other app downloads, their phone may require them to verify saved payment information or  Apple/Android password.  - The patient will need to then log into the app with their MyChart username and password, and select [place] as their healthcare provider to link the account. When it is time for your visit, go to the MyChart app, find appointments, and click Begin Video Visit. Be sure to Select Allow for your device to access the Microphone and Camera for your visit. You will then be connected, and your provider will be with you shortly.  **If they have any issues connecting, or need assistance please contact MyChart service desk [phone])**  **If using a computer, in order to ensure the best quality for their visit they will need to use either of the following Internet Browsers: [employer] or Google Chrome**  IF USING DOXIMITY or DOXY.ME - The patient will receive a link just prior to their visit by text.     FULL LENGTH CONSENT FOR TELE-HEALTH VISIT   I hereby voluntarily request, consent and authorize [facility] and its employed or contracted physicians, physician assistants, nurse practitioners or other licensed health care professionals (the Practitioner), to provide me with telemedicine health care services (the "Services") as deemed necessary by the treating Practitioner. I acknowledge and consent to receive the Services by the Practitioner via telemedicine. I understand that the telemedicine visit will involve communicating with the Practitioner through live audiovisual communication technology and the disclosure of certain medical information by electronic transmission. I acknowledge that I have been given the opportunity to request an in-person assessment or other available alternative prior to the telemedicine visit and am voluntarily participating in the telemedicine visit.  I understand that I have the right to withhold or withdraw my consent to the use of telemedicine in the course of my care at any time, without affecting my right to future care  or treatment, and that the Practitioner or I may terminate the telemedicine visit at any time. I understand that I have the right to inspect all information obtained and/or recorded in the course of the telemedicine visit and may receive copies of available information for a reasonable fee.  I understand that some of the potential risks of receiving the Services via telemedicine include:  [name] Delay or interruption in medical evaluation due to technological equipment failure or disruption; . Information transmitted may not be sufficient (e.g. poor resolution of images) to allow for appropriate medical decision making by the Practitioner; and/or  . In rare instances, security protocols could fail, causing a breach of personal health information.  Furthermore, I acknowledge that it is my responsibility to provide information about my medical history, conditions and care that is complete and accurate to the best of my ability. I acknowledge that Practitioner's advice, recommendations, and/or decision may be based on factors not within their control, such as incomplete or inaccurate data provided by me or distortions of diagnostic images or specimens that may result from electronic transmissions. I understand that  the practice of medicine is not an exact science and that Practitioner makes no warranties or guarantees regarding treatment outcomes. I acknowledge that I will receive a copy of this consent concurrently upon execution via email to the email address I last provided but may also request a printed copy by calling the office of [place].    I understand that my insurance will be billed for this visit.   I have read or had this consent read to me. . I understand the contents of this consent, which adequately explains the benefits and risks of the Services being provided via telemedicine.  . I have been provided ample opportunity to ask questions regarding this consent and the Services and have had  my questions answered to my satisfaction. . I give my informed consent for the services to be provided through the use of telemedicine in my medical care  By participating in this telemedicine visit I agree to the above.

## 2018-09-09 DIAGNOSIS — J432 Centrilobular emphysema: Secondary | ICD-10-CM | POA: Insufficient documentation

## 2018-09-09 DIAGNOSIS — I25118 Atherosclerotic heart disease of native coronary artery with other forms of angina pectoris: Secondary | ICD-10-CM | POA: Insufficient documentation

## 2018-09-09 NOTE — Progress Notes (Addendum)
Virtual Visit via Telephone Note   This visit type was conducted due to national recommendations for restrictions regarding the COVID-19 Pandemic (e.g. social distancing) in an effort to limit this patient's exposure and mitigate transmission in our community.  Due to her co-morbid illnesses, this patient is at least at moderate risk for complications without adequate follow up.  This format is felt to be most appropriate for this patient at this time.  The patient did not have access to video technology/had technical difficulties with video requiring transitioning to audio format only (telephone).  All issues noted in this document were discussed and addressed.  No physical exam could be performed with this format.  Please refer to the patient's chart for her  consent to telehealth for [facility].   I connected with  Dana [name] on 09/10/18 by a video enabled telemedicine application and verified that I am speaking with the correct person using two identifiers. I discussed the limitations of evaluation and management by telemedicine. The patient expressed understanding and agreed to proceed.   Evaluation Performed:  Follow-up visit  Date:  09/10/2018   ID:  Dana [name], DOB 07/18/1945, MRN [id number]  Patient Location:  [address] [place] [id number]   Provider location:   [facility], [place] office  PCP:  Patient, No Pcp Per  Cardiologist:  Fonnie MuGollan, [facility]   Chief Complaint:  SOB    History of Present Illness:    Dana [name] is a 73 y.o. female who presents via audio/video conferencing for a telehealth visit today.   The patient does not symptoms concerning for COVID-19 infection (fever, chills, cough, or new SHORTNESS OF BREATH).   Patient has a past medical history of smoking, anxiety attacks ,  [facility] in October 2013 with systolic CHF,   s evere left ventricular dysfunction with an EF of 15% on echocardiogram,  cardiac  catheterization showing severe proximal LAD and ostial diagonal disease, moderate proximal left circumflex disease,  [facility], had coronary bypass grafting x2 on 02/23/2012  with  epicardial pacemaker lead on the posterior lateral wall  Lost to follow-up/medication noncompliance In the hospital March 2020 exacerbation of her systolic CHF Catheterization March 2020 Still smoking Who f/u for acute on chronic diastolic and systolic CHF  Discharge from the hospital July 30, 2018 for acute on chronic diastolic and systolic CHF, COPD exacerbation  On her last clinic visit August 07, 2018 She was started on [place] When seen by CHF clinic 3 weeks later she was taking losartan and Entresto and had stopped the carvedilol  Weight 154, at home, stable Breathing is "fine" Not using nebs much  Using an inhaler  Active  No regular exercise program  Discussed recent hospital findings on echocardiogram and catheterization  Past medical records reviewed with her in detail Echocardiogram 02/17/2012 showed ejection fraction less than 25%, severe global hypokinesis, normal right ventricular systolic function, mild to moderate MR, mild to moderate TR, right ventricular systolic pressure estimated at 50-60 mmHg  Cardiac cath 02/17/2012 showing severe proximal LAD and ostial diagonal disease, bifurcation disease. Estimated at 95%. Moderate proximal left circumflex disease, small to moderate size vessel. Severely depressed ejection fraction estimated at 25%.    Prior CV studies:   The following studies were reviewed today:  TTE 07/26/2018 1. The left ventricle has severely reduced systolic function, with an ejection fraction <20%. The cavity size was severely dilated. Global hypokinesis, Concern for severe hypokinesis/possible  akinesis of the anterior/anterseptal wall/apical. Unable to  determine diastolic parameters 2. The right ventricle has mildly reduced systolic function. The cavity  was normal. There is no increase in right ventricular wall thickness. Right ventricular systolic pressure is mildly elevated with an estimated pressure of 37.7 mmHg. 3. The aortic valve is grossly normal Moderate calcification of the aortic valve. Aortic valve regurgitation was not assessed by color flow Doppler. 4. Mitral valve regurgitation is moderate 5. Left atrial size was moderately dilated.   Cath 07/30/2018  Mid LM to Dist LM lesion is 20% stenosed.  Prox LAD lesion is 80% stenosed.  Ost 1st Diag lesion is 80% stenosed.  Mid Cx lesion is 80% stenosed.  SVG and is normal in caliber.  The graft exhibits no disease.  LIMA and is normal in caliber.  The graft exhibits no disease.   1. Significant two-vessel coronary artery disease involving proximal LAD at the bifurcation of first diagonal as well as mid left circumflex. Patent grafts including LIMA to LAD and SVG to diagonal. The LAD gets mostly antegrade flow but in spite of that the LIMA is not atretic. Left circumflex distribution area is medium in size. 2. Right heart catheterization showed mildly elevated filling pressures with pulmonary wedge pressure of 13 mmHg, mild pulmonary hypertension at 38 over 40 mmHg and severely reduced cardiac output at 3.13 with an index of 1.81.  Recommendations: No need to revascularize the LAD given that the LIMA is patent.  PCI of the left circumflex is possible but I doubt it will make significant difference on ejection fraction. This should be reserved for refractory angina.  Past Medical History:  Diagnosis Date  . CHF (congestive heart failure) (HCC)   . Complication of anesthesia    "difficulty waking up, it was all day long"  . Coronary artery disease   . Hyperlipidemia   . LBBB (left bundle branch block)   . Psoriasis    Past Surgical History:  Procedure Laterality Date  . CARDIAC CATHETERIZATION  02/17/2012  . CORONARY ARTERY BYPASS GRAFT  02/23/2012    Procedure: CORONARY ARTERY BYPASS GRAFTING (CABG);  Surgeon: Loreli Slot, MD;  Location: [facility] OR;  Service: Open Heart Surgery;  Laterality: N/A;  coronary artery bypass graft on pump times two utlizing left internal mammary artery and right greater saphenous vein harvested endoscopically, transesophageal echocardiogram   . NO PAST SURGERIES    . RIGHT/LEFT HEART CATH AND CORONARY/GRAFT ANGIOGRAPHY N/A 07/30/2018   Procedure: RIGHT/LEFT HEART CATH AND CORONARY/GRAFT ANGIOGRAPHY;  Surgeon: Iran Ouch, MD;  Location: [facility] INVASIVE CV LAB;  Service: Cardiovascular;  Laterality: N/A;  . TEE WITHOUT CARDIOVERSION  02/22/2012   Procedure: TRANSESOPHAGEAL ECHOCARDIOGRAM (TEE);  Surgeon: Dolores Patty, MD;  [facility];  Service: Cardiovascular;  Laterality: N/A;     Current Meds  Medication Sig  . albuterol (VENTOLIN HFA) 108 (90 Base) MCG/ACT inhaler Inhale 2 puffs into the lungs every 6 (six) hours as needed for wheezing or shortness of breath.  [name] aspirin 81 MG chewable tablet Chew 1 tablet (81 mg total) by mouth daily.  [name] atorvastatin (LIPITOR) 80 MG tablet Take 1 tablet (80 mg total) by mouth daily at 6 PM.  . carvedilol (COREG) 3.125 MG tablet Take 1 tablet (3.125 mg total) by mouth 2 (two) times daily with a meal.  . feeding supplement, ENSURE ENLIVE, (ENSURE ENLIVE) LIQD Take 237 mLs by mouth daily.  . furosemide (LASIX) 40 MG tablet Take 1 tablet (40 mg  total) by mouth daily.  . sacubitril-valsartan (ENTRESTO) 24-26 MG Take 1 tablet by mouth 2 (two) times daily.  [name] spironolactone (ALDACTONE) 25 MG tablet Take 1 tablet (25 mg total) by mouth daily.     Allergies:   Patient has no known allergies.   Social History   Tobacco Use  . Smoking status: Current Every Day Smoker    Last attempt to quit: 01/31/2012    Years since quitting: 6.6  . Smokeless tobacco: Never Used  . Tobacco comment: Smoked but cut down when started to get sick  Substance Use Topics  .  Alcohol use: No  . Drug use: No     Current Outpatient Medications on File Prior to Visit  Medication Sig Dispense Refill  . albuterol (VENTOLIN HFA) 108 (90 Base) MCG/ACT inhaler Inhale 2 puffs into the lungs every 6 (six) hours as needed for wheezing or shortness of breath. 1 Inhaler 5  . aspirin 81 MG chewable tablet Chew 1 tablet (81 mg total) by mouth daily. 30 tablet 5  . atorvastatin (LIPITOR) 80 MG tablet Take 1 tablet (80 mg total) by mouth daily at 6 PM. 30 tablet 5  . carvedilol (COREG) 3.125 MG tablet Take 1 tablet (3.125 mg total) by mouth 2 (two) times daily with a meal. 60 tablet 5  . feeding supplement, ENSURE ENLIVE, (ENSURE ENLIVE) LIQD Take 237 mLs by mouth daily. 237 mL 12  . furosemide (LASIX) 40 MG tablet Take 1 tablet (40 mg total) by mouth daily. 30 tablet 5  . sacubitril-valsartan (ENTRESTO) 24-26 MG Take 1 tablet by mouth 2 (two) times daily. 60 tablet 11  . spironolactone (ALDACTONE) 25 MG tablet Take 1 tablet (25 mg total) by mouth daily. 30 tablet 5   No current facility-administered medications on file prior to visit.      Family Hx: The patient's family history includes Heart disease in her father.  ROS:   Please see the history of present illness.    Review of Systems  Constitutional: Negative.   HENT: Negative.   Respiratory: Positive for shortness of breath.   Cardiovascular: Negative.   Gastrointestinal: Negative.   Musculoskeletal: Negative.   Neurological: Negative.   Psychiatric/Behavioral: Negative.   All other systems reviewed and are negative.     Labs/Other Tests and Data Reviewed:    Recent Labs: 07/25/2018: ALT 24; B Natriuretic Peptide 1,289.0; Magnesium 1.7 07/26/2018: TSH 1.523 07/30/2018: BUN 32; Creatinine, Ser 0.88; Hemoglobin 14.2; Platelets 337; Potassium 4.1; Sodium 138   Recent Lipid Panel Lab Results  Component Value Date/Time   CHOL 156 07/27/2018 04:17 AM   CHOL 123 02/17/2012 03:14 AM   TRIG 99 07/27/2018 04:17 AM    TRIG 99 02/17/2012 03:14 AM   HDL 49 07/27/2018 04:17 AM   HDL 30 (L) 02/17/2012 03:14 AM   CHOLHDL 3.2 07/27/2018 04:17 AM   LDLCALC 87 07/27/2018 04:17 AM   LDLCALC 73 02/17/2012 03:14 AM    Wt Readings from Last 3 Encounters:  09/10/18 154 lb (69.9 kg)  08/28/18 154 lb (69.9 kg)  08/03/18 154 lb (69.9 kg)     Exam:    Vital Signs: Vital signs may also be detailed in the HPI BP 117/61   Pulse 92   Ht  (1.549 m)   Wt 154 lb (69.9 kg)   BMI 29.10 kg/m   Wt Readings from Last 3 Encounters:  09/10/18 154 lb (69.9 kg)  08/28/18 154 lb (69.9 kg)  08/03/18 154 lb (  69.9 kg)   Temp Readings from Last 3 Encounters:  07/30/18 (!) 97.4 F (36.3 C) (Oral)  02/29/12 97.5 F (36.4 C) (Oral)   BP Readings from Last 3 Encounters:  09/10/18 117/61  08/28/18 107/68  08/03/18 115/64   Pulse Readings from Last 3 Encounters:  09/10/18 92  08/28/18 94  08/03/18 92     Well nourished, well developed female in no acute distress. Constitutional:  oriented to person, place, and time. No distress.    ASSESSMENT & PLAN:    Coronary artery disease of native artery of native heart with stable angina pectoris (HCC) Currently with no symptoms of angina. No further workup at this time. Continue current medication regimen.  Stressed importance of smoking cessation  Chronic systolic (congestive) heart failure (HCC) Ejection fraction 20% dating back 7 years Weight is stable 154 pounds Again we recommended extra Lasix for weight over 157 pounds  Cardiomyopathy, ischemic Still smoking, discussed with her in detail She denies any anginal symptoms  Centrilobular emphysema (HCC) We have encouraged her to continue to work on weaning her cigarettes and smoking cessation. She will continue to work on this and does not want any assistance with chantix.  Stressed to her the importance of smoking cessation   COVID-19 Education: The signs and symptoms of COVID-19 were discussed with the  patient and how to seek care for testing (follow up with PCP or arrange E-visit).  The importance of social distancing was discussed today.  Patient Risk:   After full review of this patients clinical status, I feel that they are at least moderate risk at this time.  Time:   Today, I have spent 25 minutes with the patient with telehealth technology discussing the cardiac and medical problems/diagnoses detailed above   10 min spent reviewing the chart prior to patient visit today   Medication Adjustments/Labs and Tests Ordered: Current medicines are reviewed at length with the patient today.  Concerns regarding medicines are outlined above.   Tests Ordered: No tests ordered   Medication Changes: No changes made   Disposition: Follow-up in 3 months   Signed, Julien Nordmann, MD  09/10/2018 10:00 AM    [facility] [address], [place], [place] [address]

## 2018-09-10 ENCOUNTER — Other Ambulatory Visit: Payer: Self-pay

## 2018-09-10 ENCOUNTER — Encounter: Payer: Self-pay | Admitting: Cardiovascular Disease

## 2018-09-10 ENCOUNTER — Telehealth (INDEPENDENT_AMBULATORY_CARE_PROVIDER_SITE_OTHER): Payer: Medicare Other | Admitting: Cardiovascular Disease

## 2018-09-10 VITALS — BP 117/61 | HR 92 | Ht 61.0 in | Wt 154.0 lb

## 2018-09-10 DIAGNOSIS — J432 Centrilobular emphysema: Secondary | ICD-10-CM | POA: Diagnosis not present

## 2018-09-10 DIAGNOSIS — I5022 Chronic systolic (congestive) heart failure: Secondary | ICD-10-CM

## 2018-09-10 DIAGNOSIS — I255 Ischemic cardiomyopathy: Secondary | ICD-10-CM | POA: Diagnosis not present

## 2018-09-10 DIAGNOSIS — I25118 Atherosclerotic heart disease of native coronary artery with other forms of angina pectoris: Secondary | ICD-10-CM | POA: Diagnosis not present

## 2018-09-10 NOTE — Patient Instructions (Addendum)
Labs needed as she has started Entresto recently CMP at her convenience   Medication Instructions:  No changes  If you need a refill on your cardiac medications before your next appointment, please call your pharmacy.    Lab work: We would like you to have a CMP done. Please go to [facility] Entrance of the hospital and check in at the front desk and they will direct you where to go. We will call you with your results.    If you have labs (blood work) drawn today and your tests are completely normal, you will receive your results only by: [name] MyChart Message (if you have MyChart) OR . A paper copy in the mail If you have any lab test that is abnormal or we need to change your treatment, we will call you to review the results.   Testing/Procedures: No new testing needed   Follow-Up: At [facility], you and your health needs are our priority.  As part of our continuing mission to provide you with exceptional heart care, we have created designated Provider Care Teams.  These Care Teams include your primary Cardiologist (physician) and Advanced Practice Providers (APPs -  Physician Assistants and Nurse Practitioners) who all work together to provide you with the care you need, when you need it.  . You will need a follow up appointment in 3 month .   Please call our office 2 months in advance to schedule this appointment.    . Providers on your designated Care Team:   . Nicolasa Ducking, NP . Eula Listen, PA-C . Marisue Ivan, PA-C  Any Other Special Instructions Will Be Listed Below (If Applicable).  For educational health videos Log in to : [email or web address] Or : [email or web address], password : [email or web address]

## 2018-09-25 ENCOUNTER — Telehealth: Payer: Self-pay

## 2018-09-25 NOTE — Telephone Encounter (Signed)
TELEPHONE CALL NOTE  Dana [name] has been deemed a candidate for a follow-up tele-health visit to limit community exposure during the Covid-19 pandemic. I spoke with the patient via phone to ensure availability of phone/video source, confirm preferred email & phone number, discuss instructions and expectations, and review consent.   I reminded Dana [name] to be prepared with any vital sign and/or heart rhythm information that could potentially be obtained via home monitoring, at the time of her visit.  Finally, I reminded Dana [name] to expect an e-mail containing a link for their video-based visit approximately 15 minutes before her visit, or alternatively, a phone call at the time of her visit if her visit is planned to be a phone encounter.  Did the patient verbally consent to treatment as below? YES  [name], CMA 09/25/2018 10:56 AM  CONSENT FOR TELE-HEALTH VISIT - PLEASE REVIEW  I hereby voluntarily request, consent and authorize [facility] and its employed or contracted physicians, physician assistants, nurse practitioners or other licensed health care professionals (the Practitioner), to provide me with telemedicine health care services (the "Services") as deemed necessary by the treating Practitioner. I acknowledge and consent to receive the Services by the Practitioner via telemedicine. I understand that the telemedicine visit will involve communicating with the Practitioner through telephonic communication technology and the disclosure of certain medical information by electronic transmission. I acknowledge that I have been given the opportunity to request an in-person assessment or other available alternative prior to the telemedicine visit and am voluntarily participating in the telemedicine visit.  I understand that I have the right to withhold or withdraw my consent to the use of telemedicine in the course of my care at any time, without  affecting my right to future care or treatment, and that the Practitioner or I may terminate the telemedicine visit at any time. I understand that I have the right to inspect all information obtained and/or recorded in the course of the telemedicine visit and may receive copies of available information for a reasonable fee.  I understand that some of the potential risks of receiving the Services via telemedicine include:  [name] Delay or interruption in medical evaluation due to technological equipment failure or disruption; . Information transmitted may not be sufficient (e.g. poor resolution of images) to allow for appropriate medical decision making by the Practitioner; and/or  . In rare instances, security protocols could fail, causing a breach of personal health information.  Furthermore, I acknowledge that it is my responsibility to provide information about my medical history, conditions and care that is complete and accurate to the best of my ability. I acknowledge that Practitioner's advice, recommendations, and/or decision may be based on factors not within their control, such as incomplete or inaccurate data provided by me or lack of visual representation. I understand that the practice of medicine is not an exact science and that Practitioner makes no warranties or guarantees regarding treatment outcomes. I acknowledge that I will receive a copy of this consent concurrently upon execution via email to the email address I last provided but may also request a printed copy by calling the office of [facility].    I understand that my insurance may be billed for this visit.   I have read or had this consent read to me. . I understand the contents of this consent, which adequately explains the benefits and risks of the Services being provided via telemedicine.  . I have been  provided ample opportunity to ask questions regarding this consent and the Services and have had my questions  answered to my satisfaction. . I give my informed consent for the services to be provided through the use of telemedicine in my medical care  By participating in this telemedicine visit I agree to the above.

## 2018-09-25 NOTE — Telephone Encounter (Signed)
   TELEPHONE CALL NOTE  This patient has been deemed a candidate for follow-up tele-health visit to limit community exposure during the Covid-19 pandemic. I spoke with the patient via phone to discuss instructions. The patient was advised to review the section on consent for treatment as well. The patient will receive a phone call 2-3 days prior to their E-Visit at which time consent will be verbally confirmed. A Virtual Office Visit appointment type has been scheduled for 09/26/2018 with Clarisa Kindred FNP.  [name], CMA 09/25/2018 10:56 AM

## 2018-09-26 ENCOUNTER — Other Ambulatory Visit: Payer: Self-pay

## 2018-09-26 ENCOUNTER — Encounter: Payer: Self-pay | Admitting: Family

## 2018-09-26 ENCOUNTER — Ambulatory Visit: Payer: Medicare Other | Attending: Family | Admitting: Family

## 2018-09-26 VITALS — BP 118/59 | HR 91 | Wt 155.0 lb

## 2018-09-26 DIAGNOSIS — Z720 Tobacco use: Secondary | ICD-10-CM

## 2018-09-26 DIAGNOSIS — I5022 Chronic systolic (congestive) heart failure: Secondary | ICD-10-CM

## 2018-09-26 MED ORDER — CARVEDILOL 3.125 MG PO TABS: 3.1250 mg | ORAL_TABLET | Freq: Two times a day (BID) | ORAL | 5 refills | Status: DC

## 2018-09-26 MED ORDER — SACUBITRIL-VALSARTAN 24-26 MG PO TABS: 1.0000 | ORAL_TABLET | Freq: Two times a day (BID) | ORAL | 11 refills | Status: DC

## 2018-09-26 MED ORDER — SPIRONOLACTONE 25 MG PO TABS: 25.0000 mg | ORAL_TABLET | Freq: Every day | ORAL | 5 refills | Status: DC

## 2018-09-26 MED ORDER — ATORVASTATIN CALCIUM 80 MG PO TABS: 80.0000 mg | ORAL_TABLET | Freq: Every day | ORAL | 5 refills | Status: DC

## 2018-09-26 MED ORDER — FUROSEMIDE 40 MG PO TABS: 40.0000 mg | ORAL_TABLET | Freq: Every day | ORAL | 5 refills | Status: DC

## 2018-09-26 NOTE — Patient Instructions (Signed)
Continue weighing daily and call for an overnight weight gain of > 2 pounds or a weekly weight gain of >5 pounds. 

## 2018-09-26 NOTE — Progress Notes (Signed)
Virtual Visit via Telephone Note   Evaluation Performed:  Follow-up visit  This visit type was conducted due to national recommendations for restrictions regarding the COVID-19 Pandemic (e.g. social distancing).  This format is felt to be most appropriate for this patient at this time.  All issues noted in this document were discussed and addressed.  No physical exam was performed (except for noted visual exam findings with Video Visits).  Please refer to the patient's chart (MyChart message for video visits and phone note for telephone visits) for the patient's consent to telehealth for [facility]  Date:  09/26/2018   ID:  Rejina Lundin, DOB [birth year], MRN [id number]  Patient Location:  [address] [facility] [place] [address]   Provider location:   [facility] [address] [place], [place] [address]  PCP:  Patient, No Pcp Per  Cardiologist:  Julien Nordmann, MD  Electrophysiologist:  None   Chief Complaint:  Shortness of breath  History of Present Illness:    Dana [name] is a 73 y.o. female who presents via audio/video conferencing for a telehealth visit today.  Patient verified DOB and address.  The patient does not have symptoms concerning for COVID-19 infection (fever, chills, cough, or new SHORTNESS OF BREATH).   Patient reports minimal shortness of breath upon moderate exertion. She describes this as chronic in nature having been present for several years. She has associated anxiety and fatigue along with this. She denies any dizziness, swelling in her legs/ abdomen, palpitations, chest pain, cough, difficulty sleeping or weight gain. Has resumed smoking some since the pandemic.   Prior CV studies:   The following studies were reviewed today:  Echo report from 07/26/2018 reviewed and showed an EF of <20% along with moderate MR.   Past Medical History:  Diagnosis Date  . CHF (congestive heart failure) (HCC)   . Complication of  anesthesia    "difficulty waking up, it was all day long"  . Coronary artery disease   . Hyperlipidemia   . LBBB (left bundle branch block)   . Psoriasis    Past Surgical History:  Procedure Laterality Date  . CARDIAC CATHETERIZATION  02/17/2012  . CORONARY ARTERY BYPASS GRAFT  02/23/2012   Procedure: CORONARY ARTERY BYPASS GRAFTING (CABG);  Surgeon: Loreli Slot, MD;  Location: [facility] OR;  Service: Open Heart Surgery;  Laterality: N/A;  coronary artery bypass graft on pump times two utlizing left internal mammary artery and right greater saphenous vein harvested endoscopically, transesophageal echocardiogram   . NO PAST SURGERIES    . RIGHT/LEFT HEART CATH AND CORONARY/GRAFT ANGIOGRAPHY N/A 07/30/2018   Procedure: RIGHT/LEFT HEART CATH AND CORONARY/GRAFT ANGIOGRAPHY;  Surgeon: Iran Ouch, MD;  Location: [facility] INVASIVE CV LAB;  Service: Cardiovascular;  Laterality: N/A;  . TEE WITHOUT CARDIOVERSION  02/22/2012   Procedure: TRANSESOPHAGEAL ECHOCARDIOGRAM (TEE);  Surgeon: Dolores Patty, MD;  Location: [facility] ENDOSCOPY;  Service: Cardiovascular;  Laterality: N/A;     Current Meds  Medication Sig  . albuterol (VENTOLIN HFA) 108 (90 Base) MCG/ACT inhaler Inhale 2 puffs into the lungs every 6 (six) hours as needed for wheezing or shortness of breath.  [name] aspirin 81 MG chewable tablet Chew 1 tablet (81 mg total) by mouth daily.  [name] atorvastatin (LIPITOR) 80 MG tablet Take 1 tablet (80 mg total) by mouth daily at 6 PM.  . carvedilol (COREG) 3.125 MG tablet Take 1 tablet (3.125 mg total) by mouth 2 (two)  times daily with a meal.  . furosemide (LASIX) 40 MG tablet Take 1 tablet (40 mg total) by mouth daily.  . sacubitril-valsartan (ENTRESTO) 24-26 MG Take 1 tablet by mouth 2 (two) times daily.  [name] spironolactone (ALDACTONE) 25 MG tablet Take 1 tablet (25 mg total) by mouth daily.     Allergies:   Patient has no known allergies.   Social History   Tobacco Use  . Smoking status: Current  Every Day Smoker    Last attempt to quit: 01/31/2012    Years since quitting: 6.6  . Smokeless tobacco: Never Used  . Tobacco comment: Smoked but cut down when started to get sick  Substance Use Topics  . Alcohol use: No  . Drug use: No     Family Hx: The patient's family history includes Heart disease in her father.  ROS:   Please see the history of present illness.     All other systems reviewed and are negative.   Labs/Other Tests and Data Reviewed:    Recent Labs: 07/25/2018: ALT 24; B Natriuretic Peptide 1,289.0; Magnesium 1.7 07/26/2018: TSH 1.523 07/30/2018: BUN 32; Creatinine, Ser 0.88; Hemoglobin 14.2; Platelets 337; Potassium 4.1; Sodium 138   Recent Lipid Panel Lab Results  Component Value Date/Time   CHOL 156 07/27/2018 04:17 AM   CHOL 123 02/17/2012 03:14 AM   TRIG 99 07/27/2018 04:17 AM   TRIG 99 02/17/2012 03:14 AM   HDL 49 07/27/2018 04:17 AM   HDL 30 (L) 02/17/2012 03:14 AM   CHOLHDL 3.2 07/27/2018 04:17 AM   LDLCALC 87 07/27/2018 04:17 AM   LDLCALC 73 02/17/2012 03:14 AM    Wt Readings from Last 3 Encounters:  09/26/18 155 lb (70.3 kg)  09/10/18 154 lb (69.9 kg)  08/28/18 154 lb (69.9 kg)     Exam:    Vital Signs:  BP (!) 118/59 Comment: self-reported  Pulse 91 Comment: self-reported  Wt 155 lb (70.3 kg) Comment: self-reported  BMI 29.29 kg/m    Well nourished, well developed female in no  acute distress.   ASSESSMENT & PLAN:    1.Chronic heart failure with reduced ejection fraction- - NYHA class II - euvolemic today based on patient's description of symptoms - weighing daily and she says that her weight has been stable. Reminded to call for an overnight weight gain of  >2 pounds or a weekly weight gain of >5 pounds - adds "very little" salt to her food. Encouraged her to not add any salt to her food. Has been reading food labels for sodium content.  - would like to titrate up entresto/ carvedilol in the future once we can more closely  monitor her BP and lab work - may benefit from [employer] as well  2: Tobacco use-   - says that with the stress of the pandemic she has resumed tobacco but is only smoking 1 cigarette every few days - complete cessation discussed for 3 minutes with her     COVID-19 Education: The signs and symptoms of COVID-19 were discussed with the patient and how to seek care for testing (follow up with PCP or arrange E-visit).  The importance of social distancing was discussed today.  Patient Risk:   After full review of this patients clinical status, I feel that they are at least moderate risk at this time.  Time:   Today, I have spent 10 minutes with the patient with telehealth technology discussing medications, diet and weight.     Medication Adjustments/Labs and  Tests Ordered: Current medicines are reviewed at length with the patient today.  Concerns regarding medicines are outlined above.   Tests Ordered: No orders of the defined types were placed in this encounter.  Medication Changes: No orders of the defined types were placed in this encounter.   Disposition:  Follow-up in 2 months or sooner for any questions/problems before then.   Signed, Delma Freeze, FNP  09/26/2018 11:20 AM    [facility]

## 2018-11-26 ENCOUNTER — Ambulatory Visit: Payer: Medicare Other | Admitting: Family

## 2019-07-26 ENCOUNTER — Telehealth: Payer: Self-pay | Admitting: Family

## 2019-07-26 NOTE — Telephone Encounter (Signed)
Called both numbers several times with no answer and no way to lvm regarding a medication application for entreso I received a reminder on that is about to expire. Need patient to come in to sign paperwork so we can send it off to company.    Deetta Perla, [place]

## 2020-06-22 ENCOUNTER — Telehealth: Payer: Self-pay | Admitting: Cardiovascular Disease

## 2020-06-22 NOTE — Telephone Encounter (Signed)
3 attempts to schedule fu appt from recall list.   Deleting recall.   

## 2021-01-16 ENCOUNTER — Encounter (HOSPITAL_COMMUNITY): Payer: Self-pay | Admitting: Emergency Medicine

## 2021-01-16 ENCOUNTER — Emergency Department (HOSPITAL_COMMUNITY): Payer: Medicare Other

## 2021-01-16 ENCOUNTER — Inpatient Hospital Stay (HOSPITAL_COMMUNITY)
Admission: EM | Admit: 2021-01-16 | Discharge: 2021-01-22 | DRG: 291 | Disposition: A | Discharge status: Home / Self Care | Payer: Medicare Other | Attending: Internal Medicine | Admitting: Internal Medicine

## 2021-01-16 ENCOUNTER — Other Ambulatory Visit: Payer: Self-pay

## 2021-01-16 DIAGNOSIS — I130 Hypertensive heart and chronic kidney disease with heart failure and stage 1 through stage 4 chronic kidney disease, or unspecified chronic kidney disease: Secondary | ICD-10-CM | POA: Diagnosis not present

## 2021-01-16 DIAGNOSIS — I248 Other forms of acute ischemic heart disease: Secondary | ICD-10-CM | POA: Diagnosis present

## 2021-01-16 DIAGNOSIS — Z951 Presence of aortocoronary bypass graft: Secondary | ICD-10-CM

## 2021-01-16 DIAGNOSIS — Z79899 Other long term (current) drug therapy: Secondary | ICD-10-CM

## 2021-01-16 DIAGNOSIS — R0602 Shortness of breath: Secondary | ICD-10-CM | POA: Diagnosis not present

## 2021-01-16 DIAGNOSIS — I2510 Atherosclerotic heart disease of native coronary artery without angina pectoris: Secondary | ICD-10-CM | POA: Diagnosis present

## 2021-01-16 DIAGNOSIS — F17210 Nicotine dependence, cigarettes, uncomplicated: Secondary | ICD-10-CM | POA: Diagnosis present

## 2021-01-16 DIAGNOSIS — N1832 Chronic kidney disease, stage 3b: Secondary | ICD-10-CM | POA: Diagnosis present

## 2021-01-16 DIAGNOSIS — I447 Left bundle-branch block, unspecified: Secondary | ICD-10-CM | POA: Diagnosis present

## 2021-01-16 DIAGNOSIS — I214 Non-ST elevation (NSTEMI) myocardial infarction: Secondary | ICD-10-CM

## 2021-01-16 DIAGNOSIS — L409 Psoriasis, unspecified: Secondary | ICD-10-CM | POA: Diagnosis present

## 2021-01-16 DIAGNOSIS — I255 Ischemic cardiomyopathy: Secondary | ICD-10-CM | POA: Diagnosis present

## 2021-01-16 DIAGNOSIS — I509 Heart failure, unspecified: Secondary | ICD-10-CM

## 2021-01-16 DIAGNOSIS — Z20822 Contact with and (suspected) exposure to covid-19: Secondary | ICD-10-CM | POA: Diagnosis present

## 2021-01-16 DIAGNOSIS — I428 Other cardiomyopathies: Secondary | ICD-10-CM | POA: Diagnosis present

## 2021-01-16 DIAGNOSIS — Z8249 Family history of ischemic heart disease and other diseases of the circulatory system: Secondary | ICD-10-CM

## 2021-01-16 DIAGNOSIS — I083 Combined rheumatic disorders of mitral, aortic and tricuspid valves: Secondary | ICD-10-CM | POA: Diagnosis present

## 2021-01-16 DIAGNOSIS — Z7982 Long term (current) use of aspirin: Secondary | ICD-10-CM

## 2021-01-16 DIAGNOSIS — I5023 Acute on chronic systolic (congestive) heart failure: Secondary | ICD-10-CM | POA: Diagnosis present

## 2021-01-16 DIAGNOSIS — J441 Chronic obstructive pulmonary disease with (acute) exacerbation: Secondary | ICD-10-CM

## 2021-01-16 DIAGNOSIS — E785 Hyperlipidemia, unspecified: Secondary | ICD-10-CM | POA: Diagnosis present

## 2021-01-16 DIAGNOSIS — I471 Supraventricular tachycardia: Secondary | ICD-10-CM | POA: Diagnosis not present

## 2021-01-16 DIAGNOSIS — Z91120 Patient's intentional underdosing of medication regimen due to financial hardship: Secondary | ICD-10-CM

## 2021-01-16 DIAGNOSIS — N1831 Chronic kidney disease, stage 3a: Secondary | ICD-10-CM | POA: Diagnosis present

## 2021-01-16 LAB — CBC WITH DIFFERENTIAL/PLATELET
Abs Immature Granulocytes: 0.03 10*3/uL (ref 0.00–0.07)
Basophils Absolute: 0.1 10*3/uL (ref 0.0–0.1)
Basophils Relative: 1 %
Eosinophils Absolute: 0 10*3/uL (ref 0.0–0.5)
Eosinophils Relative: 0 %
HCT: 40.5 % (ref 36.0–46.0)
Hemoglobin: 12.4 g/dL (ref 12.0–15.0)
Immature Granulocytes: 0 %
Lymphocytes Relative: 11 %
Lymphs Abs: 1.1 10*3/uL (ref 0.7–4.0)
MCH: 28.8 pg (ref 26.0–34.0)
MCHC: 30.6 g/dL (ref 30.0–36.0)
MCV: 94 fL (ref 80.0–100.0)
Monocytes Absolute: 0.9 10*3/uL (ref 0.1–1.0)
Monocytes Relative: 9 %
Neutro Abs: 7.7 10*3/uL (ref 1.7–7.7)
Neutrophils Relative %: 79 %
Platelets: 296 10*3/uL (ref 150–400)
RBC: 4.31 MIL/uL (ref 3.87–5.11)
RDW: 14.4 % (ref 11.5–15.5)
WBC: 9.8 10*3/uL (ref 4.0–10.5)
nRBC: 0 % (ref 0.0–0.2)

## 2021-01-16 LAB — BASIC METABOLIC PANEL
Anion gap: 15 (ref 5–15)
BUN: 24 mg/dL — ABNORMAL HIGH (ref 8–23)
CO2: 23 mmol/L (ref 22–32)
Calcium: 9.5 mg/dL (ref 8.9–10.3)
Chloride: 103 mmol/L (ref 98–111)
Creatinine, Ser: 1.11 mg/dL — ABNORMAL HIGH (ref 0.44–1.00)
GFR, Estimated: 51 mL/min — ABNORMAL LOW (ref >60)
Glucose, Bld: 105 mg/dL — ABNORMAL HIGH (ref 70–99)
Potassium: 4.4 mmol/L (ref 3.5–5.1)
Sodium: 141 mmol/L (ref 135–145)

## 2021-01-16 LAB — I-STAT VENOUS BLOOD GAS, ED
Acid-Base Excess: 4 mmol/L — ABNORMAL HIGH (ref 0.0–2.0)
Bicarbonate: 25 mmol/L (ref 20.0–28.0)
Calcium, Ion: 0.82 mmol/L — CL (ref 1.15–1.40)
HCT: 40 % (ref 36.0–46.0)
Hemoglobin: 13.6 g/dL (ref 12.0–15.0)
O2 Saturation: 93 %
Potassium: 7.5 mmol/L (ref 3.5–5.1)
Sodium: 136 mmol/L (ref 135–145)
TCO2: 26 mmol/L (ref 22–32)
pCO2, Ven: 27.4 mmHg — ABNORMAL LOW (ref 44.0–60.0)
pH, Ven: 7.568 — ABNORMAL HIGH (ref 7.250–7.430)
pO2, Ven: 56 mmHg — ABNORMAL HIGH (ref 32.0–45.0)

## 2021-01-16 LAB — RESP PANEL BY RT-PCR (FLU A&B, COVID) ARPGX2
Influenza A by PCR: NEGATIVE
Influenza B by PCR: NEGATIVE
SARS Coronavirus 2 by RT PCR: NEGATIVE

## 2021-01-16 LAB — BRAIN NATRIURETIC PEPTIDE: B Natriuretic Peptide: 2682.2 pg/mL — ABNORMAL HIGH (ref 0.0–100.0)

## 2021-01-16 LAB — TROPONIN I (HIGH SENSITIVITY): Troponin I (High Sensitivity): 34 ng/L — ABNORMAL HIGH (ref <18)

## 2021-01-16 LAB — MAGNESIUM: Magnesium: 1.8 mg/dL (ref 1.7–2.4)

## 2021-01-16 MED ORDER — SODIUM CHLORIDE 0.9% FLUSH: 3.0000 mL | INTRAVENOUS | Status: DC | PRN

## 2021-01-16 MED ORDER — ONDANSETRON HCL 4 MG/2ML IJ SOLN: 4.0000 mg | Freq: Four times a day (QID) | INTRAMUSCULAR | Status: DC | PRN

## 2021-01-16 MED ORDER — IPRATROPIUM-ALBUTEROL 0.5-2.5 (3) MG/3ML IN SOLN
3.0000 mL | Freq: Once | RESPIRATORY_TRACT | Status: AC
  Administered 2021-01-16: 3 mL via RESPIRATORY_TRACT
  Filled 2021-01-16: qty 3

## 2021-01-16 MED ORDER — ATORVASTATIN CALCIUM 80 MG PO TABS
80.0000 mg | ORAL_TABLET | Freq: Every day | ORAL | Status: DC
  Administered 2021-01-17 – 2021-01-21 (×5): 80 mg via ORAL
  Filled 2021-01-16 (×6): qty 1

## 2021-01-16 MED ORDER — ASPIRIN 81 MG PO CHEW
81.0000 mg | CHEWABLE_TABLET | Freq: Every day | ORAL | Status: DC
  Administered 2021-01-17 – 2021-01-22 (×6): 81 mg via ORAL
  Filled 2021-01-16 (×6): qty 1

## 2021-01-16 MED ORDER — ENOXAPARIN SODIUM 40 MG/0.4ML IJ SOSY
40.0000 mg | PREFILLED_SYRINGE | Freq: Every day | INTRAMUSCULAR | Status: DC
  Administered 2021-01-16 – 2021-01-20 (×5): 40 mg via SUBCUTANEOUS
  Filled 2021-01-16 (×5): qty 0.4

## 2021-01-16 MED ORDER — ACETAMINOPHEN 325 MG PO TABS
650.0000 mg | ORAL_TABLET | ORAL | Status: DC | PRN
  Administered 2021-01-19: 650 mg via ORAL
  Filled 2021-01-16: qty 2

## 2021-01-16 MED ORDER — ASPIRIN 81 MG PO CHEW
324.0000 mg | CHEWABLE_TABLET | Freq: Once | ORAL | Status: AC
  Administered 2021-01-16: 324 mg via ORAL
  Filled 2021-01-16: qty 4

## 2021-01-16 MED ORDER — FUROSEMIDE 10 MG/ML IJ SOLN
40.0000 mg | Freq: Once | INTRAMUSCULAR | Status: AC
  Administered 2021-01-16: 40 mg via INTRAVENOUS
  Filled 2021-01-16: qty 4

## 2021-01-16 MED ORDER — FUROSEMIDE 10 MG/ML IJ SOLN
40.0000 mg | Freq: Two times a day (BID) | INTRAMUSCULAR | Status: DC
  Administered 2021-01-17 (×2): 40 mg via INTRAVENOUS
  Filled 2021-01-16 (×3): qty 4

## 2021-01-16 MED ORDER — SODIUM CHLORIDE 0.9 % IV SOLN: 250.0000 mL | INTRAVENOUS | Status: DC | PRN

## 2021-01-16 MED ORDER — IPRATROPIUM-ALBUTEROL 0.5-2.5 (3) MG/3ML IN SOLN
3.0000 mL | RESPIRATORY_TRACT | Status: DC
  Administered 2021-01-17 (×4): 3 mL via RESPIRATORY_TRACT
  Filled 2021-01-16 (×4): qty 3

## 2021-01-16 MED ORDER — SODIUM CHLORIDE 0.9 % IV SOLN
100.0000 mg | Freq: Once | INTRAVENOUS | Status: AC
  Administered 2021-01-16: 100 mg via INTRAVENOUS
  Filled 2021-01-16: qty 100

## 2021-01-16 MED ORDER — METHYLPREDNISOLONE SODIUM SUCC 125 MG IJ SOLR
60.0000 mg | Freq: Once | INTRAMUSCULAR | Status: AC
  Administered 2021-01-16: 60 mg via INTRAVENOUS
  Filled 2021-01-16: qty 2

## 2021-01-16 MED ORDER — PREDNISONE 20 MG PO TABS
40.0000 mg | ORAL_TABLET | Freq: Every day | ORAL | Status: AC
  Administered 2021-01-17 – 2021-01-20 (×4): 40 mg via ORAL
  Filled 2021-01-16 (×4): qty 2

## 2021-01-16 MED ORDER — SODIUM CHLORIDE 0.9% FLUSH
3.0000 mL | Freq: Two times a day (BID) | INTRAVENOUS | Status: DC
  Administered 2021-01-16 – 2021-01-22 (×12): 3 mL via INTRAVENOUS

## 2021-01-16 NOTE — ED Notes (Signed)
Pt ambulated with assistance on pulse oximeter. At sitting position, pt was at 92% on room air. Able to walk around her room, oxygen remained between 90-92%. Pt endorsed significantly increased shortness of breath while ambulating self, and expiratory wheezing was noted.

## 2021-01-16 NOTE — ED Notes (Signed)
Pt's son, Arlys John, spoke with this RN on the phone for an update on pt.

## 2021-01-16 NOTE — ED Triage Notes (Signed)
Pt c/o increased shortness of breath x 1 month, generalized weakness and BLE swelling. Hx chf.

## 2021-01-16 NOTE — ED Notes (Signed)
Son Arlys John [phone] would like an update

## 2021-01-16 NOTE — ED Provider Notes (Signed)
[facility] EMERGENCY DEPARTMENT Provider Note   CSN: [id number] Arrival date & time: 01/16/21  1504     History Chief Complaint  Patient presents with   Shortness of Breath    Dana [name] is a 75 y.o. female.  This is a 75 y.o. female with significant medical history as below, including CHF, CAD, HLD, S/p CABG who presents to the ED with complaint of dyspnea.   Duration: 1 month, worse in the past 3 to 4 days.  Unable to ambulate more than 5 to 10 feet without severe dyspnea.  Patient also reports she stopped taking her diuretic approximately 1 month ago because it was too expensive. Onset: At rest and with minimal exertion. Timing: Constant Exacerbating/Alleviating Factors: Worse when lying flat, worse when trying to sleep.  Worse with minimal exertion. Associated Symptoms: Nonproductive cough Pertinent Negatives: No fevers, chills, chest pain, nausea or vomiting.  No recent travel or sick contacts. No black stool or BRBPR. No urinary or bowel changes.      The history is provided by the patient. No language interpreter was used.  Shortness of Breath Severity:  Moderate Onset quality:  Gradual Duration:  4 weeks Timing:  Constant Progression:  Worsening Chronicity:  Recurrent Context: activity   Relieved by:  Rest Associated symptoms: cough   Associated symptoms: no abdominal pain, no chest pain, no fever, no headaches, no rash and no vomiting       Past Medical History:  Diagnosis Date   CHF (congestive heart failure) (HCC)    Complication of anesthesia    "difficulty waking up, it was all day long"   Coronary artery disease    Hyperlipidemia    LBBB (left bundle branch block)    Psoriasis     Patient Active Problem List   Diagnosis Date Noted   Coronary artery disease of native artery of native heart with stable angina pectoris (HCC) 09/09/2018   Centrilobular emphysema (HCC) 09/09/2018   Acute on chronic congestive heart failure  (HCC)    Chronic systolic (congestive) heart failure (HCC) 07/25/2018   S/P CABG x 2 04/13/2012   Mitral regurgitation 02/22/2012   Acute coronary syndrome (HCC) 02/18/2012   Cardiomyopathy, ischemic 02/18/2012   Acute systolic CHF (congestive heart failure) (HCC) 02/18/2012   Tobacco abuse 02/18/2012   Left bundle branch block 02/18/2012   Iron deficiency 02/18/2012    Past Surgical History:  Procedure Laterality Date   CARDIAC CATHETERIZATION  02/17/2012   CORONARY ARTERY BYPASS GRAFT  02/23/2012   Procedure: CORONARY ARTERY BYPASS GRAFTING (CABG);  Surgeon: Loreli Slot, MD;  Location: [facility] OR;  Service: Open Heart Surgery;  Laterality: N/A;  coronary artery bypass graft on pump times two utlizing left internal mammary artery and right greater saphenous vein harvested endoscopically, transesophageal echocardiogram    NO PAST SURGERIES     RIGHT/LEFT HEART CATH AND CORONARY/GRAFT ANGIOGRAPHY N/A 07/30/2018   Procedure: RIGHT/LEFT HEART CATH AND CORONARY/GRAFT ANGIOGRAPHY;  Surgeon: Iran Ouch, MD;  Location: [facility] INVASIVE CV LAB;  Service: Cardiovascular;  Laterality: N/A;   TEE WITHOUT CARDIOVERSION  02/22/2012   Procedure: TRANSESOPHAGEAL ECHOCARDIOGRAM (TEE);  Surgeon: Dolores Patty, MD;  Location: [facility] ENDOSCOPY;  Service: Cardiovascular;  Laterality: N/A;     OB History   No obstetric history on file.     Family History  Problem Relation Age of Onset   Heart disease Father     Social History   Tobacco Use   Smoking status: Every  Day    Types: Cigarettes    Last attempt to quit: 01/31/2012    Years since quitting: 8.9   Smokeless tobacco: Never   Tobacco comments:    Smoked but cut down when started to get sick  Substance Use Topics   Alcohol use: No   Drug use: No    Home Medications Prior to Admission medications   Medication Sig Start Date End Date Taking? Authorizing Provider  aspirin 81 MG chewable tablet Chew 1 tablet (81 mg total) by  mouth daily. 08/28/18  Yes Hackney, Inetta Fermo A, FNP  albuterol (VENTOLIN HFA) 108 (90 Base) MCG/ACT inhaler Inhale 2 puffs into the lungs every 6 (six) hours as needed for wheezing or shortness of breath. Patient not taking: No sig reported 08/28/18   Clarisa Kindred A, FNP  atorvastatin (LIPITOR) 80 MG tablet Take 1 tablet (80 mg total) by mouth daily at 6 PM. Patient not taking: No sig reported 09/26/18   Clarisa Kindred A, FNP  carvedilol (COREG) 3.125 MG tablet Take 1 tablet (3.125 mg total) by mouth 2 (two) times daily with a meal. Patient not taking: No sig reported 09/26/18   Clarisa Kindred A, FNP  feeding supplement, ENSURE ENLIVE, (ENSURE ENLIVE) LIQD Take 237 mLs by mouth daily. Patient not taking: Reported on 01/16/2021 07/30/18   Delfino Lovett, MD  furosemide (LASIX) 40 MG tablet Take 1 tablet (40 mg total) by mouth daily. Patient not taking: No sig reported 09/26/18   Clarisa Kindred A, FNP  sacubitril-valsartan (ENTRESTO) 24-26 MG Take 1 tablet by mouth 2 (two) times daily. Patient not taking: Reported on 01/16/2021 09/26/18   Delma Freeze, FNP  spironolactone (ALDACTONE) 25 MG tablet Take 1 tablet (25 mg total) by mouth daily. Patient not taking: Reported on 01/16/2021 09/26/18   Delma Freeze, FNP    Allergies    Patient has no known allergies.  Review of Systems   Review of Systems  Constitutional:  Positive for fatigue. Negative for chills and fever.  HENT:  Negative for facial swelling and trouble swallowing.   Eyes:  Negative for photophobia and visual disturbance.  Respiratory:  Positive for cough and shortness of breath.   Cardiovascular:  Positive for leg swelling. Negative for chest pain and palpitations.  Gastrointestinal:  Negative for abdominal pain, nausea and vomiting.  Endocrine: Negative for polydipsia and polyuria.  Genitourinary:  Negative for difficulty urinating and hematuria.  Musculoskeletal:  Negative for gait problem and joint swelling.  Skin:  Negative for pallor  and rash.  Neurological:  Negative for syncope and headaches.  Psychiatric/Behavioral:  Negative for agitation and confusion.    Physical Exam Updated Vital Signs BP 128/85   Pulse (!) 102   Temp 98.3 F (36.8 C)   Resp (!) 27   Ht 5\' 1"  (1.549 m)   Wt 65.8 kg   SpO2 90%   BMI 27.40 kg/m   Physical Exam Vitals and nursing note reviewed.  Constitutional:      General: She is not in acute distress.    Appearance: Normal appearance. She is well-developed.  HENT:     Head: Normocephalic and atraumatic.     Right Ear: External ear normal.     Left Ear: External ear normal.     Nose: Nose normal.     Mouth/Throat:     Mouth: Mucous membranes are moist.  Eyes:     General: No scleral icterus.       Right eye: No discharge.  Left eye: No discharge.  Cardiovascular:     Rate and Rhythm: Normal rate and regular rhythm.     Pulses: Normal pulses.     Heart sounds: Normal heart sounds.  Pulmonary:     Effort: Pulmonary effort is normal. Tachypnea present. No respiratory distress.     Breath sounds: Decreased air movement present. Decreased breath sounds and wheezing present.  Abdominal:     General: Abdomen is flat.     Tenderness: There is no abdominal tenderness.  Musculoskeletal:        General: Normal range of motion.     Cervical back: Normal range of motion.     Right lower leg: Edema (2+) present.     Left lower leg: Edema (2+) present.  Skin:    General: Skin is warm and dry.     Capillary Refill: Capillary refill takes less than 2 seconds.  Neurological:     Mental Status: She is alert and oriented to person, place, and time.     GCS: GCS eye subscore is 4. GCS verbal subscore is 5. GCS motor subscore is 6.  Psychiatric:        Mood and Affect: Mood normal.        Behavior: Behavior normal.    ED Results / Procedures / Treatments   Labs (all labs ordered are listed, but only abnormal results are displayed) Labs Reviewed  BRAIN NATRIURETIC PEPTIDE -  Abnormal; Notable for the following components:      Result Value   B Natriuretic Peptide 2,682.2 (*)    All other components within normal limits  BASIC METABOLIC PANEL - Abnormal; Notable for the following components:   Glucose, Bld 105 (*)    BUN 24 (*)    Creatinine, Ser 1.11 (*)    GFR, Estimated 51 (*)    All other components within normal limits  I-STAT VENOUS BLOOD GAS, ED - Abnormal; Notable for the following components:   pH, Ven 7.568 (*)    pCO2, Ven 27.4 (*)    pO2, Ven 56.0 (*)    Acid-Base Excess 4.0 (*)    Potassium 7.5 (*)    Calcium, Ion 0.82 (*)    All other components within normal limits  TROPONIN I (HIGH SENSITIVITY) - Abnormal; Notable for the following components:   Troponin I (High Sensitivity) 34 (*)    All other components within normal limits  RESP PANEL BY RT-PCR (FLU A&B, COVID) ARPGX2  CBC WITH DIFFERENTIAL/PLATELET  MAGNESIUM  PROCALCITONIN    EKG EKG Interpretation  Date/Time:  Saturday January 16 2021 16:16:57 EDT Ventricular Rate:  91 PR Interval:  171 QRS Duration: 170 QT Interval:  467 QTC Calculation: 575 R Axis:   -80 Text Interpretation: Sinus rhythm Multiform ventricular premature complexes Left bundle branch block Similar to prior tracing Confirmed by Tanda Rockers ([id number]) on 01/16/2021 5:57:58 PM  Radiology DG Chest 2 View  Result Date: 01/16/2021 CLINICAL DATA:  Shortness of breath. EXAM: CHEST - 2 VIEW COMPARISON:  CT chest 07/25/2018. FINDINGS: The heart is enlarged, unchanged. Patient is status post cardiac surgery. Wires are seen in the left chest, unchanged. There is no focal lung infiltrate, pleural effusion or pneumothorax. Chronic appearing interstitial opacities are seen in the lung bases. No acute fractures are identified. IMPRESSION: 1. Stable cardiomegaly. 2. No evidence for pneumonia or edema. Electronically Signed   By: Darliss Cheney M.D.   On: 01/16/2021 15:49    Procedures .Critical Care Performed by: Tanda Rockers  A, DO Authorized by: Sloan Leiter, DO   Critical care provider statement:    Critical care time (minutes):  45   Critical care time was exclusive of:  Separately billable procedures and treating other patients   Critical care was necessary to treat or prevent imminent or life-threatening deterioration of the following conditions:  Respiratory failure and cardiac failure   Critical care was time spent personally by me on the following activities:  Discussions with consultants, evaluation of patient's response to treatment, examination of patient, ordering and performing treatments and interventions, ordering and review of laboratory studies, ordering and review of radiographic studies, pulse oximetry, re-evaluation of patient's condition, obtaining history from patient or surrogate and review of old charts   Care discussed with: admitting provider   Comments:     CHF exacerbation, hypoxic, iv diuresis, supplemental oxygen   Medications Ordered in ED Medications  doxycycline (VIBRAMYCIN) 100 mg in sodium chloride 0.9 % 250 mL IVPB (100 mg Intravenous New Bag/Given 01/16/21 2027)  methylPREDNISolone sodium succinate (SOLU-MEDROL) 125 mg/2 mL injection 60 mg (60 mg Intravenous Given 01/16/21 1734)  ipratropium-albuterol (DUONEB) 0.5-2.5 (3) MG/3ML nebulizer solution 3 mL (3 mLs Nebulization Given 01/16/21 1736)  aspirin chewable tablet 324 mg (324 mg Oral Given 01/16/21 1904)  furosemide (LASIX) injection 40 mg (40 mg Intravenous Given 01/16/21 2023)    ED Course  I have reviewed the triage vital signs and the nursing notes.  Pertinent labs & imaging results that were available during my care of the patient were reviewed by me and considered in my medical decision making (see chart for details).    MDM Rules/Calculators/A&P                            This patient complains of dyspnea; this involves an extensive number of treatment options and is a complaint that carries with it a high risk  of complications and morbidity. Wheezing b/l, tachypnea, reduced air movement b/l.  Patient becomes severely dyspneic with minimal exertion.  Unable to walk to the chair in the corner of the room without becoming dyspneic.  Serious etiologies considered.    Patient wheezing on exam, history of tobacco use.  Give nebulized breathing treatment, steroids, doxycycline.  Concern for possible COPD exacerbation.  Also sent procalcitonin.  X-ray chest without overt evidence of pneumonia.Sharen Hones with abnormal K, BMP with K within normal limits. Favor likely error in [place]. Continue to monitor closely   Labs reviewed, mildly elevated troponin, likely secondary to respiratory distress.  EKG with evidence acute ischemia.  Chest x-ray nonacute.  Favor demand ischemia at this time.  NO ongoing Chest pain will give aspirin.  Patient with likely acute CHF exacerbation.  Prior echocardiogram 2 years ago with EF less than 20%.  Patient has not been taking her Lasix over the past month secondary to cost issues.   Place on supplemental oxygen.  D/w internal medicine who accepts patient for admission.      Final Clinical Impression(s) / ED Diagnoses Final diagnoses:  Acute on chronic congestive heart failure, unspecified heart failure type (HCC)  Shortness of breath  COPD exacerbation (HCC)  NSTEMI (non-ST elevated myocardial infarction) [facility])    Rx / DC Orders ED Discharge Orders     None        Sloan Leiter, DO 01/16/21 2033

## 2021-01-16 NOTE — H&P (Addendum)
Date: 01/16/2021               Patient Name:  Dana [name] MRN: [id number]  DOB: [birth year] Age / Sex: 75 y.o., female   PCP: Patient, No Pcp Per (Inactive)         Medical Service: Internal Medicine Teaching Service         Attending Physician: Dr. Earl Lagos, MD    First Contact: Champ Mungo, DO Pager: ED [phone]  Second Contact: Marolyn Haller, MD Pager: [phone]       After Hours (After 5p/  First Contact Pager: [phone]  weekends / holidays): Second Contact Pager: [phone]   SUBJECTIVE   Chief Complaint: SOB  History of Present Illness: Dana [name] is a 75 y.o. female past medical history of  CHF (March 2020 echo with EF < 20%), CAD, HLD, s/p CABG, and psoriasis who presents with increased SOB. She ran out of her Lasix approximately a one and half months ago, and she ran out of her [name] about 1 year ago.  She states that cost has been the biggest issue for her in terms of access.  Since she ran out of her Lasix about 1 to 2 months ago, she has noticed increased swelling in her legs and increased dyspnea on exertion. She can walk about 45 feet until she needs to stop due to SOB.  She is also able to normally mow the lawn but has not been able to recently due to SOB. She endorses PND, orthopnea, and LE edema.  There are no other complaints or concerns at this time.  Meds:  Current Meds  Medication Sig   aspirin 81 MG chewable tablet Chew 1 tablet (81 mg total) by mouth daily.  Albuterol inhaler, Q6h as needed Atorvastatin, 80 mg p.o. daily Carvedilol 3.125 mg p.o. twice daily Lasix 40 mg p.o. daily Entresto 24-26 mg p.o. twice daily Spironolactone 25 mg p.o. daily  Past Medical History:  Diagnosis Date   CHF (congestive heart failure) (HCC)    Complication of anesthesia    "difficulty waking up, it was all day long"   Coronary artery disease    Hyperlipidemia    LBBB (left bundle branch block)    Psoriasis     Past Surgical History:  Procedure  Laterality Date   CARDIAC CATHETERIZATION  02/17/2012   CORONARY ARTERY BYPASS GRAFT  02/23/2012   Procedure: CORONARY ARTERY BYPASS GRAFTING (CABG);  Surgeon: Loreli Slot, MD;  Location: [facility] OR;  Service: Open Heart Surgery;  Laterality: N/A;  coronary artery bypass graft on pump times two utlizing left internal mammary artery and right greater saphenous vein harvested endoscopically, transesophageal echocardiogram    NO PAST SURGERIES     RIGHT/LEFT HEART CATH AND CORONARY/GRAFT ANGIOGRAPHY N/A 07/30/2018   Procedure: RIGHT/LEFT HEART CATH AND CORONARY/GRAFT ANGIOGRAPHY;  Surgeon: Iran Ouch, MD;  Location: [facility] INVASIVE CV LAB;  Service: Cardiovascular;  Laterality: N/A;   TEE WITHOUT CARDIOVERSION  02/22/2012   Procedure: TRANSESOPHAGEAL ECHOCARDIOGRAM (TEE);  Surgeon: Dolores Patty, MD;  Location: [facility] ENDOSCOPY;  Service: Cardiovascular;  Laterality: N/A;    Social:  She is now living alone.  She has 4 children.  She is able to perform her own ADL and IADL's.  Endorses smoking history of about 16-20 years, 1-2 PPD. No alcohol use. No drug use.   Family History: Both mom and dad had T2DM. Her dad had a CABG. Children are borderline diabetic.  No cancer in her family.  Allergies: Allergies as of 01/16/2021   (No Known Allergies)    Review of Systems: Review of Systems  Constitutional:  Negative for chills and fever.  HENT:  Positive for hearing loss and tinnitus.   Respiratory:  Positive for shortness of breath. Negative for cough and sputum production.   Cardiovascular:  Negative for chest pain and palpitations.  Genitourinary:  Negative for dysuria and frequency.  Musculoskeletal:  Negative for falls.  Skin:  Positive for rash.  Neurological:  Negative for dizziness and weakness.  Psychiatric/Behavioral:  Negative for depression. The patient is not nervous/anxious.     OBJECTIVE:   Physical Exam: Blood pressure 112/79, pulse 92, temperature 98.3 F (36.8  C), resp. rate (!) 21, height 5\' 1"  (1.549 m), weight 65.8 kg, SpO2 91 %.  Constitutional: elderly woman laying in bed, in no acute distress HENT: normocephalic atraumatic, mucous membranes moist Eyes: PERRL, conjunctiva non-erythematous Neck: supple Cardiovascular: tachycardic, regular rhythm, no m/r/g, 2+ BLE edema, No JVD Pulmonary/Chest: using accessory muscles to breathe on RA, wheezing at bilateral lung bases, no crackles Abdominal: soft, non-tender, non-distended, bowel sounds normal MSK: normal bulk and tone Neurological: alert & oriented x 3, no focal deficit  Skin: warm and dry, diffuse psoriatic plaques Psych: Normal mood and behavior  Labs: CBC    Component Value Date/Time   WBC 9.8 01/16/2021 1544   RBC 4.31 01/16/2021 1544   HGB 13.6 01/16/2021 1706   HGB 9.9 (L) 02/17/2012 0314   HCT 40.0 01/16/2021 1706   HCT 31.0 (L) 02/17/2012 0314   PLT 296 01/16/2021 1544   PLT 339 02/17/2012 0314   MCV 94.0 01/16/2021 1544   MCV 79 (L) 02/17/2012 0314   MCH 28.8 01/16/2021 1544   MCHC 30.6 01/16/2021 1544   RDW 14.4 01/16/2021 1544   RDW 17.8 (H) 02/17/2012 0314   LYMPHSABS 1.1 01/16/2021 1544   LYMPHSABS 1.7 02/17/2012 0314   MONOABS 0.9 01/16/2021 1544   MONOABS 0.6 02/17/2012 0314   EOSABS 0.0 01/16/2021 1544   EOSABS 0.1 02/17/2012 0314   BASOSABS 0.1 01/16/2021 1544   BASOSABS 0.1 02/17/2012 0314     CMP     Component Value Date/Time   NA 141 01/16/2021 1800   NA 143 02/17/2012 0314   K 4.4 01/16/2021 1800   K 3.5 02/17/2012 0314   CL 103 01/16/2021 1800   CL 106 02/17/2012 0314   CO2 23 01/16/2021 1800   CO2 27 02/17/2012 0314   GLUCOSE 105 (H) 01/16/2021 1800   GLUCOSE 98 02/17/2012 0314   BUN 24 (H) 01/16/2021 1800   BUN 20 (H) 02/17/2012 0314   CREATININE 1.11 (H) 01/16/2021 1800   CREATININE 1.04 02/17/2012 0314   CALCIUM 9.5 01/16/2021 1800   CALCIUM 8.4 (L) 02/17/2012 0314   PROT 7.5 07/25/2018 2245   PROT 7.0 02/16/2012 1015   ALBUMIN  4.2 07/25/2018 2245   ALBUMIN 3.6 02/16/2012 1015   AST 24 07/25/2018 2245   AST 35 02/16/2012 1015   ALT 24 07/25/2018 2245   ALT 47 02/16/2012 1015   ALKPHOS 70 07/25/2018 2245   ALKPHOS 104 02/16/2012 1015   BILITOT 1.0 07/25/2018 2245   BILITOT 1.1 (H) 02/16/2012 1015   GFRNONAA 51 (L) 01/16/2021 1800   GFRNONAA 55 (L) 02/17/2012 0314   GFRAA >60 07/30/2018 0425   GFRAA >60 02/17/2012 0314    Imaging: DG Chest 2 View  Result Date: 01/16/2021 CLINICAL DATA:  Shortness of breath. EXAM: CHEST - 2 VIEW COMPARISON:  CT chest 07/25/2018. FINDINGS: The heart is enlarged, unchanged. Patient is status post cardiac surgery. Wires are seen in the left chest, unchanged. There is no focal lung infiltrate, pleural effusion or pneumothorax. Chronic appearing interstitial opacities are seen in the lung bases. No acute fractures are identified. IMPRESSION: 1. Stable cardiomegaly. 2. No evidence for pneumonia or edema. Electronically Signed   By: Darliss Cheney M.D.   On: 01/16/2021 15:49    EKG: personally reviewed my interpretation is normal sinus rhythm, unchanged from prior   ASSESSMENT & PLAN:    Assessment & Plan by Problem: Active Problems:   Heart failure (HCC)   Dana [name] is a 75 y.o. with pertinent PMH of CHF (March 2020 echo with EF < 20%), CAD, HLD, s/p CABG, and psoriasis who presented with dyspnea and admitted for acute decompensated HF on hospital day 0.  #Acute decompensated heart failure #CAD Patient presents with 1 to 52-month history of progressive dyspnea in the setting of inability to afford her normal medications, including Entresto and Lasix. Prior echocardiogram 2 years ago with EF less than 20%. Patient has been tachypneic since arrival but is otherwise HDS.  Physical exam is remarkable for 2+ bilateral pitting edema but without JVD or crackles. Chest x-ray shows stable cardiomegaly but otherwise no lung infiltrates or effusions. BNP 2682. EKG is without ischemic  changes. Per review of records, CHF is most likely 2/2 ischemic cardiomyopathy given history of coronary disease and bypass surgery in 2013. Will begin diuresis with Lasix at this time and will optimize with GDMT when able. -Continue IV Lasix, 40 mg twice daily -Echo pending -Repeat EKG pending -Cardiac monitoring -Strict I's/O's -Daily weights -Trend CMP  #COPD Patient is on albuterol at home. In the ED, she received nebulized breathing treatment, steroids, and 1x doxycyline d/t concern for COPD exacerbation. Today, patient denies recent illness, cough, or sputum production. She does endorse dyspnea, likely related to CHF exacerbation as above. CXR also without focal opacity. On exam, she does have some wheezing. Will resume scheduled duonebs and steroid tx. She only has 1 of 3 of the cardinal symptoms of COPD exacerbation, so will hold antibiotic tx at this time. -Continue DuoNebs every 4 hours -Continue prednisone 40 mg p.o. daily -Procalcitonin pending  #CAD, s/p CABG in 2013 Pt is on aspirin at home.  Patient denies chest pain today. Troponin in the ED was mildly elevated at 34, most likely due to demand ischemia. Patient was loaded with 324 mg aspirin in the ED. Prior documentation notes that recent EKG shows ischemic changes, however I do not see ischemic changes on her EKGs after review. We will obtain repeat troponin and continue to monitor closely. -Daily ASA -Follow-up troponin #2  #HLD -Continue atorvastatin 80 mg p.o. daily   Prior to Admission Living Arrangement: Home, living alone Anticipated Discharge Location: Home Barriers to Discharge: Management of acute decompensated heart failure  Dispo: Admit patient to Observation with expected length of stay less than 2 midnights.  Signed: Fredonia Highland, MD Internal Medicine Resident PGY-1 Pager: [phone]  01/16/2021, 10:31 PM

## 2021-01-17 ENCOUNTER — Inpatient Hospital Stay (HOSPITAL_COMMUNITY): Payer: Medicare Other

## 2021-01-17 DIAGNOSIS — I248 Other forms of acute ischemic heart disease: Secondary | ICD-10-CM | POA: Diagnosis present

## 2021-01-17 DIAGNOSIS — I25810 Atherosclerosis of coronary artery bypass graft(s) without angina pectoris: Secondary | ICD-10-CM | POA: Diagnosis not present

## 2021-01-17 DIAGNOSIS — R0609 Other forms of dyspnea: Secondary | ICD-10-CM | POA: Diagnosis not present

## 2021-01-17 DIAGNOSIS — N1831 Chronic kidney disease, stage 3a: Secondary | ICD-10-CM | POA: Diagnosis present

## 2021-01-17 DIAGNOSIS — Z91120 Patient's intentional underdosing of medication regimen due to financial hardship: Secondary | ICD-10-CM | POA: Diagnosis not present

## 2021-01-17 DIAGNOSIS — J441 Chronic obstructive pulmonary disease with (acute) exacerbation: Secondary | ICD-10-CM | POA: Diagnosis present

## 2021-01-17 DIAGNOSIS — I340 Nonrheumatic mitral (valve) insufficiency: Secondary | ICD-10-CM | POA: Diagnosis not present

## 2021-01-17 DIAGNOSIS — Z7982 Long term (current) use of aspirin: Secondary | ICD-10-CM | POA: Diagnosis not present

## 2021-01-17 DIAGNOSIS — Z20822 Contact with and (suspected) exposure to covid-19: Secondary | ICD-10-CM | POA: Diagnosis present

## 2021-01-17 DIAGNOSIS — I255 Ischemic cardiomyopathy: Secondary | ICD-10-CM | POA: Diagnosis present

## 2021-01-17 DIAGNOSIS — I428 Other cardiomyopathies: Secondary | ICD-10-CM | POA: Diagnosis present

## 2021-01-17 DIAGNOSIS — E785 Hyperlipidemia, unspecified: Secondary | ICD-10-CM | POA: Diagnosis present

## 2021-01-17 DIAGNOSIS — I2510 Atherosclerotic heart disease of native coronary artery without angina pectoris: Secondary | ICD-10-CM | POA: Diagnosis present

## 2021-01-17 DIAGNOSIS — I959 Hypotension, unspecified: Secondary | ICD-10-CM | POA: Diagnosis not present

## 2021-01-17 DIAGNOSIS — R0602 Shortness of breath: Secondary | ICD-10-CM | POA: Diagnosis present

## 2021-01-17 DIAGNOSIS — I509 Heart failure, unspecified: Secondary | ICD-10-CM | POA: Diagnosis not present

## 2021-01-17 DIAGNOSIS — I5021 Acute systolic (congestive) heart failure: Secondary | ICD-10-CM | POA: Diagnosis not present

## 2021-01-17 DIAGNOSIS — Z951 Presence of aortocoronary bypass graft: Secondary | ICD-10-CM | POA: Diagnosis not present

## 2021-01-17 DIAGNOSIS — F17210 Nicotine dependence, cigarettes, uncomplicated: Secondary | ICD-10-CM | POA: Diagnosis present

## 2021-01-17 DIAGNOSIS — I130 Hypertensive heart and chronic kidney disease with heart failure and stage 1 through stage 4 chronic kidney disease, or unspecified chronic kidney disease: Secondary | ICD-10-CM | POA: Diagnosis present

## 2021-01-17 DIAGNOSIS — N1832 Chronic kidney disease, stage 3b: Secondary | ICD-10-CM | POA: Diagnosis present

## 2021-01-17 DIAGNOSIS — I5023 Acute on chronic systolic (congestive) heart failure: Secondary | ICD-10-CM | POA: Diagnosis present

## 2021-01-17 DIAGNOSIS — Z8249 Family history of ischemic heart disease and other diseases of the circulatory system: Secondary | ICD-10-CM | POA: Diagnosis not present

## 2021-01-17 DIAGNOSIS — I083 Combined rheumatic disorders of mitral, aortic and tricuspid valves: Secondary | ICD-10-CM | POA: Diagnosis present

## 2021-01-17 DIAGNOSIS — Z79899 Other long term (current) drug therapy: Secondary | ICD-10-CM | POA: Diagnosis not present

## 2021-01-17 DIAGNOSIS — I447 Left bundle-branch block, unspecified: Secondary | ICD-10-CM | POA: Diagnosis present

## 2021-01-17 DIAGNOSIS — I471 Supraventricular tachycardia: Secondary | ICD-10-CM | POA: Diagnosis not present

## 2021-01-17 DIAGNOSIS — L409 Psoriasis, unspecified: Secondary | ICD-10-CM | POA: Diagnosis present

## 2021-01-17 DIAGNOSIS — I214 Non-ST elevation (NSTEMI) myocardial infarction: Secondary | ICD-10-CM | POA: Diagnosis not present

## 2021-01-17 LAB — COMPREHENSIVE METABOLIC PANEL
ALT: 25 U/L (ref 0–44)
AST: 26 U/L (ref 15–41)
Albumin: 3.1 g/dL — ABNORMAL LOW (ref 3.5–5.0)
Alkaline Phosphatase: 81 U/L (ref 38–126)
Anion gap: 15 (ref 5–15)
BUN: 20 mg/dL (ref 8–23)
CO2: 26 mmol/L (ref 22–32)
Calcium: 9.3 mg/dL (ref 8.9–10.3)
Chloride: 101 mmol/L (ref 98–111)
Creatinine, Ser: 1.19 mg/dL — ABNORMAL HIGH (ref 0.44–1.00)
GFR, Estimated: 47 mL/min — ABNORMAL LOW (ref >60)
Glucose, Bld: 148 mg/dL — ABNORMAL HIGH (ref 70–99)
Potassium: 3.8 mmol/L (ref 3.5–5.1)
Sodium: 142 mmol/L (ref 135–145)
Total Bilirubin: 1.3 mg/dL — ABNORMAL HIGH (ref 0.3–1.2)
Total Protein: 6 g/dL — ABNORMAL LOW (ref 6.5–8.1)

## 2021-01-17 LAB — CBC WITH DIFFERENTIAL/PLATELET
Abs Immature Granulocytes: 0.02 10*3/uL (ref 0.00–0.07)
Basophils Absolute: 0 10*3/uL (ref 0.0–0.1)
Basophils Relative: 0 %
Eosinophils Absolute: 0 10*3/uL (ref 0.0–0.5)
Eosinophils Relative: 0 %
HCT: 38.6 % (ref 36.0–46.0)
Hemoglobin: 12 g/dL (ref 12.0–15.0)
Immature Granulocytes: 0 %
Lymphocytes Relative: 8 %
Lymphs Abs: 0.5 10*3/uL — ABNORMAL LOW (ref 0.7–4.0)
MCH: 29.4 pg (ref 26.0–34.0)
MCHC: 31.1 g/dL (ref 30.0–36.0)
MCV: 94.6 fL (ref 80.0–100.0)
Monocytes Absolute: 0.2 10*3/uL (ref 0.1–1.0)
Monocytes Relative: 3 %
Neutro Abs: 5 10*3/uL (ref 1.7–7.7)
Neutrophils Relative %: 89 %
Platelets: 241 10*3/uL (ref 150–400)
RBC: 4.08 MIL/uL (ref 3.87–5.11)
RDW: 14.6 % (ref 11.5–15.5)
WBC: 5.7 10*3/uL (ref 4.0–10.5)
nRBC: 0 % (ref 0.0–0.2)

## 2021-01-17 LAB — ECHOCARDIOGRAM COMPLETE
AR max vel: 0.95 cm2
AV Area VTI: 0.94 cm2
AV Area mean vel: 0.88 cm2
AV Mean grad: 5 mmHg
AV Peak grad: 8.9 mmHg
Ao pk vel: 1.49 m/s
Area-P 1/2: 4.8 cm2
Height: 61 in
MV M vel: 4.1 m/s
MV Peak grad: 67.2 mmHg
Radius: 0.5 cm
S' Lateral: 6.4 cm
Weight: 2320 oz

## 2021-01-17 LAB — MAGNESIUM: Magnesium: 1.7 mg/dL (ref 1.7–2.4)

## 2021-01-17 LAB — TROPONIN I (HIGH SENSITIVITY): Troponin I (High Sensitivity): 27 ng/L — ABNORMAL HIGH (ref <18)

## 2021-01-17 LAB — PROCALCITONIN: Procalcitonin: <0.1 ng/mL

## 2021-01-17 LAB — TSH: TSH: 2.142 u[IU]/mL (ref 0.350–4.500)

## 2021-01-17 MED ORDER — IPRATROPIUM-ALBUTEROL 0.5-2.5 (3) MG/3ML IN SOLN
3.0000 mL | RESPIRATORY_TRACT | Status: DC | PRN
  Administered 2021-01-18: 3 mL via RESPIRATORY_TRACT
  Filled 2021-01-17: qty 3

## 2021-01-17 MED ORDER — ACETAZOLAMIDE 250 MG PO TABS
250.0000 mg | ORAL_TABLET | Freq: Every day | ORAL | Status: DC
  Administered 2021-01-17: 250 mg via ORAL
  Filled 2021-01-17 (×2): qty 1

## 2021-01-17 MED ORDER — PERFLUTREN LIPID MICROSPHERE
1.0000 mL | INTRAVENOUS | Status: AC | PRN
  Administered 2021-01-17: 2 mL via INTRAVENOUS
  Filled 2021-01-17: qty 10

## 2021-01-17 MED ORDER — MELATONIN 3 MG PO TABS
3.0000 mg | ORAL_TABLET | Freq: Every day | ORAL | Status: DC
  Administered 2021-01-17 – 2021-01-21 (×5): 3 mg via ORAL
  Filled 2021-01-17 (×5): qty 1

## 2021-01-17 NOTE — Progress Notes (Signed)
  Echocardiogram 2D Echocardiogram with contrast has been performed.  Dana [name] F 01/17/2021, 4:53 PM

## 2021-01-17 NOTE — Progress Notes (Signed)
Patient request for something to help her sleep tonight, denied taking any sleep aid at home. MD notified.

## 2021-01-17 NOTE — Care Management (Signed)
PCP recommended for patient , PCP [facility] taking new patients.

## 2021-01-17 NOTE — Progress Notes (Signed)
   Subjective:  Patient feels her orthopnea and shortness of breath have improved since admission. She denies chest pain, palpitations, NV, or abdominal pain.  She is able to eat and drink without any difficulty. She is urinating without difficulty.   Objective:  Vital signs in last 24 hours: Vitals:   01/17/21 1200 01/17/21 1300 01/17/21 1352 01/17/21 1414  BP: 114/67 100/70  121/79  Pulse: 85 98  100  Resp: (!) 24 19  18   Temp:   (!) 97.3 F (36.3 C) 98.7 F (37.1 C)  TempSrc:   Oral Oral  SpO2: 96% 94%  100%  Weight:      Height:       General: NAD CVS: tachycardic, normal heart sounds. Lungs: Expiratory wheeze throughout lung fields, no crackles Abdomen: Soft, nontender, nondistended Extremities: 2+ pitting edema of BLE up to the knees HEENT: Normocephalic, atraumatic Psych: Normal mood and affect Skin: Psoriatic plaques of BUE/BLE, back diffusely  Assessment/Plan:  Active Problems:   Heart failure (HCC)  #Acute systolic heart failure exacerbation  Patient presented with worsening SOB and lower extremity edema with orthopnea. This was in the setting of not being able to afford her medications. On exam she appears volume overloaded though her shortness of breath could also be partially be attributed to her COPD as she was noted to have wheezing on exam. She is s/p 1x dose of IV lasix 40mg .  -Continue IV lasix 40mg  BID -F/u Echo -Continue Telemetry, daily weights, strict I/Os -Will resume GDMT was patient's labs have normalized, TOC c/s for assistance finding PCP for patient   #Acute COPD exacerbation  Patient with worsening SOB and noted to have diffuse expiratory wheezing throughout the lung fields. She has a 20 pack year smoking history. She does likely have concomitant HF exacerbation, but no evidence of pneumonia on CXR, no cough, or increased sputum produciton.  -Hold antibiotic therapy  -Continue nebs -Continue PO prednisone 40mg  daily   #CAD s/p CABG in  2013 Patient with no findings on EKG c/f ischemia though she was noted to have mildly elevated hs-Troponins. That peaked at 34. This is likely 2/2 demand ischemia. She was loaded with aspirin in the ED.  -Continue ASA   Prior to Admission Living Arrangement: Home, living alone Anticipated Discharge Location: Home Barriers to Discharge: Management of acute decompensated heart failure   Dispo: Admit patient to Observation with expected length of stay less than 2 midnights.  , MD 01/17/2021, 3:21 PM Pager: [phone] After 5pm on weekdays and 1pm on weekends: On Call pager [phone]

## 2021-01-18 ENCOUNTER — Encounter (HOSPITAL_COMMUNITY): Payer: Self-pay | Admitting: Internal Medicine

## 2021-01-18 ENCOUNTER — Other Ambulatory Visit (HOSPITAL_COMMUNITY): Payer: Self-pay

## 2021-01-18 DIAGNOSIS — I5023 Acute on chronic systolic (congestive) heart failure: Secondary | ICD-10-CM

## 2021-01-18 DIAGNOSIS — I959 Hypotension, unspecified: Secondary | ICD-10-CM

## 2021-01-18 DIAGNOSIS — I340 Nonrheumatic mitral (valve) insufficiency: Secondary | ICD-10-CM

## 2021-01-18 DIAGNOSIS — J441 Chronic obstructive pulmonary disease with (acute) exacerbation: Secondary | ICD-10-CM

## 2021-01-18 DIAGNOSIS — I509 Heart failure, unspecified: Secondary | ICD-10-CM | POA: Diagnosis not present

## 2021-01-18 LAB — BASIC METABOLIC PANEL
Anion gap: 11 (ref 5–15)
BUN: 30 mg/dL — ABNORMAL HIGH (ref 8–23)
CO2: 30 mmol/L (ref 22–32)
Calcium: 9.1 mg/dL (ref 8.9–10.3)
Chloride: 98 mmol/L (ref 98–111)
Creatinine, Ser: 1.33 mg/dL — ABNORMAL HIGH (ref 0.44–1.00)
GFR, Estimated: 41 mL/min — ABNORMAL LOW (ref >60)
Glucose, Bld: 91 mg/dL (ref 70–99)
Potassium: 3.7 mmol/L (ref 3.5–5.1)
Sodium: 139 mmol/L (ref 135–145)

## 2021-01-18 LAB — CBC
HCT: 37.4 % (ref 36.0–46.0)
Hemoglobin: 11.3 g/dL — ABNORMAL LOW (ref 12.0–15.0)
MCH: 28.8 pg (ref 26.0–34.0)
MCHC: 30.2 g/dL (ref 30.0–36.0)
MCV: 95.2 fL (ref 80.0–100.0)
Platelets: 261 10*3/uL (ref 150–400)
RBC: 3.93 MIL/uL (ref 3.87–5.11)
RDW: 14.5 % (ref 11.5–15.5)
WBC: 10.5 10*3/uL (ref 4.0–10.5)
nRBC: 0 % (ref 0.0–0.2)

## 2021-01-18 LAB — MAGNESIUM: Magnesium: 1.8 mg/dL (ref 1.7–2.4)

## 2021-01-18 MED ORDER — AMIODARONE HCL 200 MG PO TABS
200.0000 mg | ORAL_TABLET | Freq: Two times a day (BID) | ORAL | Status: DC
  Administered 2021-01-18 – 2021-01-22 (×9): 200 mg via ORAL
  Filled 2021-01-18 (×9): qty 1

## 2021-01-18 MED ORDER — AMIODARONE HCL 200 MG PO TABS: 200.0000 mg | ORAL_TABLET | Freq: Every day | ORAL | Status: DC

## 2021-01-18 MED ORDER — DIGOXIN 125 MCG PO TABS
0.1250 mg | ORAL_TABLET | Freq: Every day | ORAL | Status: DC
  Administered 2021-01-18 – 2021-01-22 (×5): 0.125 mg via ORAL
  Filled 2021-01-18 (×5): qty 1

## 2021-01-18 MED ORDER — FUROSEMIDE 10 MG/ML IJ SOLN
80.0000 mg | Freq: Two times a day (BID) | INTRAMUSCULAR | Status: DC
  Administered 2021-01-18: 80 mg via INTRAVENOUS
  Filled 2021-01-18: qty 8

## 2021-01-18 MED ORDER — POTASSIUM CHLORIDE 20 MEQ PO PACK
40.0000 meq | PACK | Freq: Once | ORAL | Status: AC
  Administered 2021-01-18: 40 meq via ORAL
  Filled 2021-01-18: qty 2

## 2021-01-18 MED ORDER — IPRATROPIUM-ALBUTEROL 0.5-2.5 (3) MG/3ML IN SOLN
3.0000 mL | RESPIRATORY_TRACT | Status: AC
  Administered 2021-01-18 (×3): 3 mL via RESPIRATORY_TRACT
  Filled 2021-01-18 (×3): qty 3

## 2021-01-18 MED ORDER — MAGNESIUM SULFATE 2 GM/50ML IV SOLN
2.0000 g | Freq: Once | INTRAVENOUS | Status: AC
  Administered 2021-01-18: 2 g via INTRAVENOUS
  Filled 2021-01-18: qty 50

## 2021-01-18 MED ORDER — POTASSIUM CHLORIDE CRYS ER 20 MEQ PO TBCR
40.0000 meq | EXTENDED_RELEASE_TABLET | Freq: Two times a day (BID) | ORAL | Status: DC
  Administered 2021-01-18 – 2021-01-21 (×7): 40 meq via ORAL
  Filled 2021-01-18 (×7): qty 2

## 2021-01-18 MED ORDER — HYDRALAZINE HCL 25 MG PO TABS
25.0000 mg | ORAL_TABLET | Freq: Three times a day (TID) | ORAL | Status: DC
  Administered 2021-01-18 – 2021-01-22 (×12): 25 mg via ORAL
  Filled 2021-01-18 (×13): qty 1

## 2021-01-18 NOTE — Hospital Course (Addendum)
CHF Exacerbation Patient presented to the ED complaining of progressively worsening shortness of breath and bilateral lower extremity edema with orthopnea.  She was initiated on Lasix 40 mg IV twice daily.  At this time she was also noted to have a mildly increased creatinine from her baseline, at 1.19.  Of note her troponins were mildly elevated to 34 on presentation, however this decreased to 27 on recheck and attributed to demand ischemia.  Echocardiogram on 9/18 showed LV EF of 10 to 15%, severely decreased function, global hypokinesis, severe dilation; RV systolic function severely decreased, mild enlargement of RV, moderately increased pulmonary artery systolic pressure; LA mildly dilated, severe mitral valve regurgitation.  Due to patient hypotension and increasing creatinine, Lasix was held.  The heart failure team was consulted at this time and she was initiated on digoxin 0.125 mg daily for blood pressure support, hydralazine 25 mg 3 times daily for afterload reduction, Lasix 80 mg IV twice daily for volume overload. Due to atrial tachycardia episodes noted on telemetry, she was initiated on amiodarone 200 mg twice daily. She also received Jardiance 10 mg daily. To increase diuresis, Lasix was increased to 120 mg IV twice daily 09/20 with improvement in urine output. She was continued on IV diuresis through 09/22 with transition to oral diuresis on day of discharge. She is overall down around 10 kg this admission and net -2,273.5 mL.   COPD exacerbation Patient noted to have diffuse expiratory wheezing bilaterally on exam.  Antibiotics were deferred as she did not have cough or increased sputum production.  She initiated on DuoNebs every 4 hours, and prednisone 40 mg daily for 5 days.  Chest x-ray 9/18 did not show evidence of underlying infection. Due to persistent diffuse wheezing in bilateral lung fields, the patient was started on Anora Ellipta with daily improvement in wheezing. On day of  discharge there was no wheezing on auscultation and patient reported continuing to feel asymptomatic.  #CKD Stage III Patient's renal function remained relatively stable throughout admission with Bun/Cr 43/1.56 on day of discharge.  #CAD s/p  CABG in 2013 Patient was continued on aspirin 81 mg and home atorvastatin was increased to 80 mg daily.

## 2021-01-18 NOTE — Consult Note (Signed)
Cardiology Consultation:   Patient ID: Dana [name] MRN: [id number]; DOB: [birth year]  Admit date: 01/16/2021 Date of Consult: 01/18/2021  PCP:  Patient, No Pcp Per (Inactive)   [facility] Providers Cardiologist:  Julien Nordmann, MD        Patient Profile:   Dana [name] is a 75 y.o. female with a hx of CAD. CABG 01/2012, HFrEF/ICM with EF 15-20%, MR, + tobacco use, HLD, LBBB, last cardiology visit in 2020 telehealth,  who is being seen 01/18/2021 for the evaluation of CHF at the request of Dr. Criselda Peaches.  History of Present Illness:   Dana [name] with above hx and cardiac cath 07/2018 with patent grafts LIMA to LAD, VG to diag. LAD gets mostly antegrade flow and LIMA is not atretic.  Rt heart cath with mildly elevated filling pressures with pulmonary wedge of 13 mmHg, mld pulmonary HTN at 38, and cardiac output at 3.13 and index 1.81.   Lcx with 80% stenosis.   PCI would be possible but Dr. Kirke Corin did not believe it would improve EF.  Echo at that time with EF, 20%, LV severely dilated.  RV with mildly reduced systolic function.  MR is moderate.  LA size mod. Dilated.   She was placed on entresto in 2020.    Pt presented to ER 01/16/21 with increased SOB for a month weakness and lower ext edema. She could only walk 45 feet before stopping due to dyspnea.  She ran out of her entresto 1 year ago and out of lasix 2 months ago.  Also not on BB, statin or aldactone.  At one time cost of meds $500 per month.  Since then increasing SOB.   She continues to smoke.   Admitted with acute systolic CHF.  Placed on IV diuretics. Additionally positive for COPD exacerbation. She tells me she stopped smoking 2 weeks ago.  She cannot walk far due to hip and knee pain.  She does not have a walker at home.  She no longer has PCP and cannot find one near [place] where she lives.  We discussed importance of cardiology in [place].  Her children can drive her there.   She denies any chest pain.  She has  rec'd  lasix 40 BID for 3 doses.  She is neg 1590 and wt per chart is up from 65.8 Kg to 72.8 Kg.    Echo this admit EF 10-15%, global hypokinesis, LV severely dilated.  RV systolic function is severely reduced.  Moderately elevated pulmonary artery systolic pressure.  Severe MR, no MS, mild TR.   Na 139, K+ 3.7, BUN 30, Cr 1.33 (on admit 1.11 and from 2020 Cr 0.88) Mg+ 1.8  albumin 3.1, total prot 6.0  BNP 2,682  Hs troponin 34 TSH 2.142  EKG:  The EKG was personally reviewed and demonstrates:  SR at 97 with LBBB no acute ST changes.  On follow up SR 91 with LBBB and PVCs Telemetry:  Telemetry was personally reviewed and demonstrates:  SR to SVT at 170.     BP 108/69, P 95 T 97.6  Now on ASA lipitor prednisone, to receive Mg+  She feels better today and tells me her edema has improved  Past Medical History:  Diagnosis Date   CHF (congestive heart failure) (HCC)    Complication of anesthesia    "difficulty waking up, it was all day long"   Coronary artery disease    Hyperlipidemia    LBBB (left bundle branch block)  Psoriasis     Past Surgical History:  Procedure Laterality Date   CARDIAC CATHETERIZATION  02/17/2012   CORONARY ARTERY BYPASS GRAFT  02/23/2012   Procedure: CORONARY ARTERY BYPASS GRAFTING (CABG);  Surgeon: Loreli Slot, MD;  Location: [facility] OR;  Service: Open Heart Surgery;  Laterality: N/A;  coronary artery bypass graft on pump times two utlizing left internal mammary artery and right greater saphenous vein harvested endoscopically, transesophageal echocardiogram    NO PAST SURGERIES     RIGHT/LEFT HEART CATH AND CORONARY/GRAFT ANGIOGRAPHY N/A 07/30/2018   Procedure: RIGHT/LEFT HEART CATH AND CORONARY/GRAFT ANGIOGRAPHY;  Surgeon: Iran Ouch, MD;  Location: [facility] INVASIVE CV LAB;  Service: Cardiovascular;  Laterality: N/A;   TEE WITHOUT CARDIOVERSION  02/22/2012   Procedure: TRANSESOPHAGEAL ECHOCARDIOGRAM (TEE);  Surgeon: Dolores Patty, MD;   Location: [facility] ENDOSCOPY;  Service: Cardiovascular;  Laterality: N/A;     Home Medications:  Prior to Admission medications   Medication Sig Start Date End Date Taking? Authorizing Provider  aspirin 81 MG chewable tablet Chew 1 tablet (81 mg total) by mouth daily. 08/28/18  Yes Hackney, Inetta Fermo A, FNP  albuterol (VENTOLIN HFA) 108 (90 Base) MCG/ACT inhaler Inhale 2 puffs into the lungs every 6 (six) hours as needed for wheezing or shortness of breath. Patient not taking: No sig reported 08/28/18   Clarisa Kindred A, FNP  atorvastatin (LIPITOR) 80 MG tablet Take 1 tablet (80 mg total) by mouth daily at 6 PM. Patient not taking: No sig reported 09/26/18   Clarisa Kindred A, FNP  carvedilol (COREG) 3.125 MG tablet Take 1 tablet (3.125 mg total) by mouth 2 (two) times daily with a meal. Patient not taking: No sig reported 09/26/18   Clarisa Kindred A, FNP  feeding supplement, ENSURE ENLIVE, (ENSURE ENLIVE) LIQD Take 237 mLs by mouth daily. Patient not taking: Reported on 01/16/2021 07/30/18   Delfino Lovett, MD  furosemide (LASIX) 40 MG tablet Take 1 tablet (40 mg total) by mouth daily. Patient not taking: No sig reported 09/26/18   Clarisa Kindred A, FNP  sacubitril-valsartan (ENTRESTO) 24-26 MG Take 1 tablet by mouth 2 (two) times daily. Patient not taking: Reported on 01/16/2021 09/26/18   Delma Freeze, FNP  spironolactone (ALDACTONE) 25 MG tablet Take 1 tablet (25 mg total) by mouth daily. Patient not taking: Reported on 01/16/2021 09/26/18   Delma Freeze, FNP   Was only taking ASA on arrival.    Inpatient Medications: Scheduled Meds:  aspirin  81 mg Oral Daily   atorvastatin  80 mg Oral q1800   enoxaparin (LOVENOX) injection  40 mg Subcutaneous QHS   ipratropium-albuterol  3 mL Nebulization Q4H   melatonin  3 mg Oral QHS   potassium chloride  40 mEq Oral Once   predniSONE  40 mg Oral Q breakfast   sodium chloride flush  3 mL Intravenous Q12H   Continuous Infusions:  sodium chloride     magnesium  sulfate bolus IVPB     PRN Meds: sodium chloride, acetaminophen, ondansetron (ZOFRAN) IV, sodium chloride flush  Allergies:   No Known Allergies  Social History:   Social History   Socioeconomic History   Marital status: Divorced    Spouse name: Not on file   Number of children: Not on file   Years of education: Not on file   Highest education level: Not on file  Occupational History   Not on file  Tobacco Use   Smoking status: Every Day    Types: Cigarettes  Last attempt to quit: 01/31/2012    Years since quitting: 8.9   Smokeless tobacco: Never   Tobacco comments:    Smoked but cut down when started to get sick  Substance and Sexual Activity   Alcohol use: No   Drug use: No   Sexual activity: Not on file  Other Topics Concern   Not on file  Social History Narrative   Not on file   Social Determinants of Health   Financial Resource Strain: Not on file  Food Insecurity: Not on file  Transportation Needs: Not on file  Physical Activity: Not on file  Stress: Not on file  Social Connections: Not on file  Intimate Partner Violence: Not on file    Family History:    Family History  Problem Relation Age of Onset   Heart disease Father      ROS:  Please see the history of present illness.  General:no colds or fevers, no weight changes Skin:no ulcers- +psoriasis HEENT:no blurred vision, no congestion CV:see HPI PUL:see HPI GI:no diarrhea constipation or melena, no indigestion GU:no hematuria, no dysuria MS:no joint pain, no claudication, pain in knees and hips difficult to walk Neuro:no syncope, no lightheadedness Endo:no diabetes, no thyroid disease  All other ROS reviewed and negative.     Physical Exam/Data:   Vitals:   01/18/21 0008 01/18/21 0429 01/18/21 0936 01/18/21 1228  BP: 108/68 103/83  108/69  Pulse: 89 90  95  Resp: Temp: 97.6 F (36.4 C) 97.7 F (36.5 C)  97.6 F (36.4 C)  TempSrc: Oral Oral  Oral  SpO2: 100% 99% 98%  100%  Weight:  72.8 kg    Height:        Intake/Output Summary (Last 24 hours) at 01/18/2021 1330 Last data filed at 01/18/2021 0430 Gross per 24 hour  Intake 480 ml  Output 850 ml  Net -370 ml   Last 3 Weights 01/18/2021 01/16/2021 09/26/2018  Weight (lbs) 160 lb 8 oz 145 lb 155 lb  Weight (kg) 72.802 kg 65.772 kg 70.308 kg     Body mass index is 30.33 kg/m.  General:  Well nourished, well developed, in no acute distress HEENT: normal Neck: ++JVD Vascular: No carotid bruits; Distal pulses 2+ bilaterally Cardiac:  normal S1, S2; RRR; no murmur gallup rub or click Lungs:  diminished to auscultation bilaterally, + insp and exp wheezing, no rhonchi or rales  Abd: soft, nontender, no hepatomegaly  Ext: 1+ to trace lower ext edema Musculoskeletal:  No deformities, BUE and BLE strength normal and equal Skin: warm and dry  + psoriasis on ext and back Neuro:  CNs 2-12 intact, no focal abnormalities noted Psych:  Normal affect   Relevant CV Studies: 01/17/21 Echo  IMPRESSIONS     1. Left ventricular ejection fraction, by estimation, is 10-15%. The left  ventricle has severely decreased function. The left ventricle demonstrates  global hypokinesis. The left ventricular internal cavity size was severely  dilated. Indeterminate  diastolic filling due to E-A fusion.   2. Right ventricular systolic function is severely reduced. The right  ventricular size is mildly enlarged. There is moderately elevated  pulmonary artery systolic pressure. The estimated right ventricular  systolic pressure is 45.7 mmHg.   3. Left atrial size was mildly dilated.   4. The mitral valve is grossly normal. Severe mitral valve regurgitation.  No evidence of mitral stenosis.   5. The aortic valve is tricuspid. There is mild thickening of the  aortic  valve. Aortic valve regurgitation is not visualized. No aortic stenosis is  present.   6. The inferior vena cava is normal in size with <50% respiratory   variability, suggesting right atrial pressure of 8 mmHg.   Comparison(s): Changes from prior study are noted. EF unchanged. RVSP  elevated compared to prior. Severe mitral regurgitation is present that is  secondary to cardiomyopathy.   Conclusion(s)/Recommendation(s): No left ventricular mural or apical  thrombus/thrombi.   FINDINGS   Left Ventricle: Left ventricular ejection fraction, by estimation, is  10-15%. The left ventricle has severely decreased function. The left  ventricle demonstrates global hypokinesis. Definity contrast agent was  given IV to delineate the left ventricular  endocardial borders. The left ventricular internal cavity size was  severely dilated. There is no left ventricular hypertrophy. Indeterminate  diastolic filling due to E-A fusion.   Right Ventricle: The right ventricular size is mildly enlarged. No  increase in right ventricular wall thickness. Right ventricular systolic  function is severely reduced. There is moderately elevated pulmonary  artery systolic pressure. The tricuspid  regurgitant velocity is 3.07 m/s, and with an assumed right atrial  pressure of 8 mmHg, the estimated right ventricular systolic pressure is  45.7 mmHg.   Left Atrium: Left atrial size was mildly dilated.   Right Atrium: Right atrial size was normal in size.   Pericardium: Trivial pericardial effusion is present.   Mitral Valve: The mitral valve is grossly normal. Severe mitral valve  regurgitation. No evidence of mitral valve stenosis.   Tricuspid Valve: The tricuspid valve is grossly normal. Tricuspid valve  regurgitation is mild . No evidence of tricuspid stenosis.   Aortic Valve: The aortic valve is tricuspid. There is mild thickening of  the aortic valve. Aortic valve regurgitation is not visualized. No aortic  stenosis is present. Aortic valve mean gradient measures 5.0 mmHg. Aortic  valve peak gradient measures 8.9  mmHg. Aortic valve area, by VTI measures  0.94 cm.   Pulmonic Valve: The pulmonic valve was grossly normal. Pulmonic valve  regurgitation is not visualized. No evidence of pulmonic stenosis.   Aorta: The aortic root is normal in size and structure.   Venous: The inferior vena cava is normal in size with less than 50%  respiratory variability, suggesting right atrial pressure of 8 mmHg.    Cardiac cath 07/30/18 Mid LM to Dist LM lesion is 20% stenosed. Prox LAD lesion is 80% stenosed. Ost 1st Diag lesion is 80% stenosed. Mid Cx lesion is 80% stenosed. SVG and is normal in caliber. The graft exhibits no disease. LIMA and is normal in caliber. The graft exhibits no disease.   1.  Significant two-vessel coronary artery disease involving proximal LAD at the bifurcation of first diagonal as well as mid left circumflex.  Patent grafts including LIMA to LAD and SVG to diagonal.  The LAD gets mostly antegrade flow but in spite of that the LIMA is not atretic.  Left circumflex distribution area is medium in size. 2.  Right heart catheterization showed mildly elevated filling pressures with pulmonary wedge pressure of 13 mmHg, mild pulmonary hypertension at 38 over 40 mmHg and severely reduced cardiac output at 3.13 with an index of 1.81.   Recommendations: No need to revascularize the LAD given that the LIMA is patent.  PCI of the left circumflex is possible but I doubt it will make significant difference on ejection fraction.  This should be reserved for refractory angina. Recommend optimizing medical therapy for chronic  systolic heart failure. I switch furosemide to oral.  I added spironolactone.  The patient can likely be discharged home tomorrow if she remains stable. The plan is to switch her from losartan to [facility] as an outpatient.  Mid LM to Dist LM lesion is 20% stenosed. Prox LAD lesion is 80% stenosed. Ost 1st Diag lesion is 80% stenosed. Mid Cx lesion is 80% stenosed. SVG and is normal in caliber. The graft exhibits no  disease. LIMA and is normal in caliber. The graft exhibits no disease.   1.  Significant two-vessel coronary artery disease involving proximal LAD at the bifurcation of first diagonal as well as mid left circumflex.  Patent grafts including LIMA to LAD and SVG to diagonal.  The LAD gets mostly antegrade flow but in spite of that the LIMA is not atretic.  Left circumflex distribution area is medium in size. 2.  Right heart catheterization showed mildly elevated filling pressures with pulmonary wedge pressure of 13 mmHg, mild pulmonary hypertension at 38 over 40 mmHg and severely reduced cardiac output at 3.13 with an index of 1.81.   Recommendations: No need to revascularize the LAD given that the LIMA is patent.  PCI of the left circumflex is possible but I doubt it will make significant difference on ejection fraction.  This should be reserved for refractory angina. Recommend optimizing medical therapy for chronic systolic heart failure. I switch furosemide to oral.  I added spironolactone.  The patient can likely be discharged home tomorrow if she remains stable. The plan is to switch her from losartan to [facility] as an outpatient.  Echo 07/26/18  IMPRESSIONS     1. The left ventricle has severely reduced systolic function, with an  ejection fraction < 20%. The cavity size was severely dilated. Global  hypokinesis, Concern for severe hypokinesis/possible akinesis of the  anterior/anterseptal wall/apical. Unable to  determine diastolic parameters   2. The right ventricle has mildly reduced systolic function. The cavity  was normal. There is no increase in right ventricular wall thickness.  Right ventricular systolic pressure is mildly elevated with an estimated  pressure of 37.7 mmHg.   3. The aortic valve is grossly normal Moderate calcification of the  aortic valve. Aortic valve regurgitation was not assessed by color flow  Doppler.   4. Mitral valve regurgitation is moderate   5.  Left atrial size was moderately dilated.   FINDINGS   Left Ventricle: The left ventricle has severely reduced systolic  function, with an ejection fraction of 20-25%. The cavity size was  severely dilated. There is no increase in left ventricular wall thickness.  Left ventricular diastolic Doppler parameters  are consistent with pseudonormalization.  Right Ventricle: The right ventricle has mildly reduced systolic function.  The cavity was normal. There is no increase in right ventricular wall  thickness. Right ventricular systolic pressure is mildly elevated with an  estimated pressure of 37.7 mmHg.  Left Atrium: left atrial size was moderately dilated  Right Atrium: right atrial size was normal in size. Right atrial pressure  is estimated at 10 mmHg.  Interatrial Septum: No atrial level shunt detected by color flow Doppler.  Pericardium: There is no evidence of pericardial effusion.  Mitral Valve: The mitral valve is normal in structure. Mitral valve  regurgitation is moderate by color flow Doppler.  Tricuspid Valve: The tricuspid valve is normal in structure. Tricuspid  valve regurgitation is mild by color flow Doppler.  Aortic Valve: The aortic valve is grossly normal Moderate calcification  of  the aortic valve. Aortic valve regurgitation was not assessed by color  flow Doppler.  Pulmonic Valve: The pulmonic valve was normal in structure. Pulmonic valve  regurgitation was not assessed by color flow Doppler.  Venous: The inferior vena cava is normal in size with greater than 50%  respiratory variability.     Laboratory Data:  High Sensitivity Troponin:   Recent Labs  Lab 01/16/21 1504 01/17/21 0511  TROPONINIHS 34* 27*     Chemistry Recent Labs  Lab 01/16/21 1800 01/16/21 1921 01/17/21 0511 01/18/21 0639 01/18/21 0840  NA 141  --  142 139  --   K 4.4  --  3.8 3.7  --   CL 103  --  101 98  --   CO2 23  --  26 30  --   GLUCOSE 105*  --  148* 91  --   BUN 24*  --   20 30*  --   CREATININE 1.11*  --  1.19* 1.33*  --   CALCIUM 9.5  --  9.3 9.1  --   MG  --  1.8 1.7  --  1.8  GFRNONAA 51*  --  47* 41*  --   ANIONGAP 15  --  15 11  --     Recent Labs  Lab 01/17/21 0511  PROT 6.0*  ALBUMIN 3.1*  AST 26  ALT 25  ALKPHOS 81  BILITOT 1.3*   Lipids No results for input(s): CHOL, TRIG, HDL, LABVLDL, LDLCALC, CHOLHDL in the last 168 hours.  Hematology Recent Labs  Lab 01/16/21 1544 01/16/21 1706 01/17/21 0511 01/18/21 0639  WBC 9.8  --  5.7 10.5  RBC 4.31  --  4.08 3.93  HGB 12.4 13.6 12.0 11.3*  HCT 40.5 40.0 38.6 37.4  MCV 94.0  --  94.6 95.2  MCH 28.8  --  29.4 28.8  MCHC 30.6  --  31.1 30.2  RDW 14.4  --  14.6 14.5  PLT 296  --  241 261   Thyroid  Recent Labs  Lab 01/17/21 0511  TSH 2.142    BNP Recent Labs  Lab 01/16/21 1544  BNP 2,682.2*    DDimer No results for input(s): DDIMER in the last 168 hours.   Radiology/Studies:  DG Chest 2 View  Result Date: 01/16/2021 CLINICAL DATA:  Shortness of breath. EXAM: CHEST - 2 VIEW COMPARISON:  CT chest 07/25/2018. FINDINGS: The heart is enlarged, unchanged. Patient is status post cardiac surgery. Wires are seen in the left chest, unchanged. There is no focal lung infiltrate, pleural effusion or pneumothorax. Chronic appearing interstitial opacities are seen in the lung bases. No acute fractures are identified. IMPRESSION: 1. Stable cardiomegaly. 2. No evidence for pneumonia or edema. Electronically Signed   By: Darliss Cheney M.D.   On: 01/16/2021 15:49   ECHOCARDIOGRAM COMPLETE  Result Date: 01/17/2021    ECHOCARDIOGRAM REPORT   Patient Name:   Dana [name] Date of Exam: 01/17/2021 Medical Rec #:  [id number]         Height:       61.0 in Accession #:    [id number]        Weight:       145.0 lb Date of Birth:  1945-12-01          BSA:          1.647 m Patient Age:    75 years          BP:  121/79 mmHg Patient Gender: F                 HR:           62 bpm. Exam Location:   Inpatient Procedure: 2D Echo, Cardiac Doppler, Color Doppler and Intracardiac            Opacification Agent Indications:    Dyspnea R06.00  History:        Patient has prior history of Echocardiogram examinations. CHF,                 CAD, Arrythmias:LBBB; Risk Factors:Dyslipidemia.  Sonographer:    Roosvelt Maser RDCS Referring Phys: [id number] SAMUEL A GRAY IMPRESSIONS  1. Left ventricular ejection fraction, by estimation, is 10-15%. The left ventricle has severely decreased function. The left ventricle demonstrates global hypokinesis. The left ventricular internal cavity size was severely dilated. Indeterminate diastolic filling due to E-A fusion.  2. Right ventricular systolic function is severely reduced. The right ventricular size is mildly enlarged. There is moderately elevated pulmonary artery systolic pressure. The estimated right ventricular systolic pressure is 45.7 mmHg.  3. Left atrial size was mildly dilated.  4. The mitral valve is grossly normal. Severe mitral valve regurgitation. No evidence of mitral stenosis.  5. The aortic valve is tricuspid. There is mild thickening of the aortic valve. Aortic valve regurgitation is not visualized. No aortic stenosis is present.  6. The inferior vena cava is normal in size with <50% respiratory variability, suggesting right atrial pressure of 8 mmHg. Comparison(s): Changes from prior study are noted. EF unchanged. RVSP elevated compared to prior. Severe mitral regurgitation is present that is secondary to cardiomyopathy. Conclusion(s)/Recommendation(s): No left ventricular mural or apical thrombus/thrombi. FINDINGS  Left Ventricle: Left ventricular ejection fraction, by estimation, is 10-15%. The left ventricle has severely decreased function. The left ventricle demonstrates global hypokinesis. Definity contrast agent was given IV to delineate the left ventricular endocardial borders. The left ventricular internal cavity size was severely dilated. There is no left  ventricular hypertrophy. Indeterminate diastolic filling due to E-A fusion. Right Ventricle: The right ventricular size is mildly enlarged. No increase in right ventricular wall thickness. Right ventricular systolic function is severely reduced. There is moderately elevated pulmonary artery systolic pressure. The tricuspid regurgitant velocity is 3.07 m/s, and with an assumed right atrial pressure of 8 mmHg, the estimated right ventricular systolic pressure is 45.7 mmHg. Left Atrium: Left atrial size was mildly dilated. Right Atrium: Right atrial size was normal in size. Pericardium: Trivial pericardial effusion is present. Mitral Valve: The mitral valve is grossly normal. Severe mitral valve regurgitation. No evidence of mitral valve stenosis. Tricuspid Valve: The tricuspid valve is grossly normal. Tricuspid valve regurgitation is mild . No evidence of tricuspid stenosis. Aortic Valve: The aortic valve is tricuspid. There is mild thickening of the aortic valve. Aortic valve regurgitation is not visualized. No aortic stenosis is present. Aortic valve mean gradient measures 5.0 mmHg. Aortic valve peak gradient measures 8.9 mmHg. Aortic valve area, by VTI measures 0.94 cm. Pulmonic Valve: The pulmonic valve was grossly normal. Pulmonic valve regurgitation is not visualized. No evidence of pulmonic stenosis. Aorta: The aortic root is normal in size and structure. Venous: The inferior vena cava is normal in size with less than 50% respiratory variability, suggesting right atrial pressure of 8 mmHg. IAS/Shunts: The atrial septum is grossly normal. Additional Comments: A device lead is visualized in the right atrium and right ventricle.  LEFT VENTRICLE PLAX 2D LVIDd:  6.80 cm  Diastology LVIDs:         6.40 cm  LV e' medial:    2.83 cm/s LV PW:         0.90 cm  LV E/e' medial:  38.2 LV IVS:        1.00 cm  LV e' lateral:   9.70 cm/s LVOT diam:     1.90 cm  LV E/e' lateral: 11.1 LV SV:         23 LV SV Index:   14  LVOT Area:     2.84 cm  RIGHT VENTRICLE            IVC RV Basal diam:  4.30 cm    IVC diam: 2.00 cm RV Mid diam:    3.00 cm RV S prime:     7.62 cm/s TAPSE (M-mode): 1.3 cm LEFT ATRIUM             Index       RIGHT ATRIUM           Index LA diam:        4.10 cm 2.49 cm/m  RA Area:     20.80 cm LA Vol (A2C):   49.2 ml 29.86 ml/m RA Volume:   66.30 ml  40.24 ml/m LA Vol (A4C):   43.4 ml 26.34 ml/m LA Biplane Vol: 46.4 ml 28.16 ml/m  AORTIC VALVE AV Area (Vmax):    0.95 cm AV Area (Vmean):   0.88 cm AV Area (VTI):     0.94 cm AV Vmax:           149.00 cm/s AV Vmean:          101.000 cm/s AV VTI:            0.243 m AV Peak Grad:      8.9 mmHg AV Mean Grad:      5.0 mmHg LVOT Vmax:         49.80 cm/s LVOT Vmean:        31.400 cm/s LVOT VTI:          0.080 m LVOT/AV VTI ratio: 0.33  AORTA Ao Root diam: 3.00 cm MITRAL VALVE                 TRICUSPID VALVE MV Area (PHT): 4.80 cm      TR Peak grad:   37.7 mmHg MV Decel Time: 158 msec      TR Vmax:        307.00 cm/s MR Peak grad:    67.2 mmHg MR Mean grad:    42.0 mmHg   SHUNTS MR Vmax:         410.00 cm/s Systemic VTI:  0.08 m MR Vmean:        305.0 cm/s  Systemic Diam: 1.90 cm MR PISA:         1.57 cm MR PISA Eff ROA: 12 mm MR PISA Radius:  0.50 cm MV E velocity: 108.00 cm/s MV A velocity: 66.60 cm/s MV E/A ratio:  1.62 Lennie Odor MD Electronically signed by Lennie Odor MD Signature Date/Time: 01/17/2021/7:41:04 PM    Final      Assessment and Plan:   Acute on chronic HPrEF with decrease of EF from <20% to 10-15%, was noted to be ICM though may be NICM unless disease has progressed. has been off entresto for 1 year and lasix for 2 months.  Had stopped statin, BB as well and aldactone.  May need  ARB instead of entresto unless she can have financial assistance. Meds per Dr. Flora Lipps, aldactone, BB and ARB vs entresto CAD with hx of CABG in 2013 with patent grafts on cath in 2020 with LCX of 80% stenosis. This may have increased.  No chest pain troponin  27 and 34.   CKD slight bump with diuresis.  Acute COPD exacerbation. On steroids. Still with wheezes HLD  check lipids. Continue statin Tobacco use tells me she stopped 2 weeks ago.  PSVT runs of rapid HR to 170.  Add coreg begin low dose.  Chronic LBBB   Risk Assessment/Risk Scores:        New York Heart Association (NYHA) Functional Class NYHA Class IV        For questions or updates, please contact [facility] Please consult [email or web address] for contact info under    Signed, Nada Boozer, NP  01/18/2021 1:30 PM

## 2021-01-18 NOTE — TOC Initial Note (Addendum)
Transition of Care [facility]) - Initial/Assessment Note    Patient Details  Name: Dana [name] MRN: [id number] Date of Birth: 01-12-1946  [facility]) CM/SW Contact:    Kingsley Plan, RN Phone Number: 01/18/2021, 10:32 AM  Clinical Narrative:                 Patient from home alone. Confirmed face sheet information. Consulted for PCP. Confirmed with patient she does not have a PCP.   Explained she can call insurance and be provided a complete list of providers in network.   Patient interested in a PCP in [place] or close to [place].  First choice is [facility] in [place] [phone], NCM called and spoke to [place] at this time they are not accepting new patients.  [facility] [place] [phone] , they do not accept insurance, they are a membership program, patient would need to pay $99.00 a month to go there.    [place] [facility] [phone] , accepting new patients however not until January 2023   [facility] [phone], spoke to [place], they are not accepting new patients.    Called [facility] they are not accepting new patient's.    [facility] [phone] spoke to Misty Stanley they are accepting new patients, however first available appointment not until April 02, 2021 at 1:20 pm , they will place her on a wait list and call if they have a cancellations.   Explained above to patient. Asked if [facility] can see her prior to establishing care would she have transportation. Patient stated her son or daughter could take her. Secure chatted Dr Champ Mungo . Dr August Saucer returned message , patient can be seen in their clinic    Expected Discharge Plan: Home/Self Care     Patient Goals and CMS Choice Patient states their goals for this hospitalization and ongoing recovery are:: to go home CMS Medicare.gov Compare Post Acute Care list provided to:: Patient Choice offered to / list presented to  : Patient  Expected Discharge Plan and Services Expected Discharge Plan: Home/Self Care   Discharge Planning Services: CM Consult, Other - See comment (PCP)   Living arrangements for the past 2 months: Single Family Home                 DME Arranged: N/A DME Agency: NA       HH Arranged: NA          Prior Living Arrangements/Services Living arrangements for the past 2 months: Single Family Home Lives with:: Self Patient language and need for interpreter reviewed:: Yes        Need for Family Participation in Patient Care: Yes (Comment) Care giver support system in place?: Yes (comment)   Criminal Activity/Legal Involvement Pertinent to Current Situation/Hospitalization: No - Comment as needed  Activities of Daily Living Home Assistive Devices/Equipment: None ADL Screening (condition at time of admission) Patient's cognitive ability adequate to safely complete daily activities?: Yes Is the patient deaf or have difficulty hearing?: No Does the patient have difficulty seeing, even when wearing glasses/contacts?: No Does the patient have difficulty concentrating, remembering, or making decisions?: No Patient able to express need for assistance with ADLs?: Yes Does the patient have difficulty dressing or bathing?: Yes Independently performs ADLs?: No Does the patient have difficulty walking or climbing stairs?: Yes (increase Shortness of breath) Weakness of Legs: None  Weakness of Arms/Hands: None  Permission Sought/Granted   Permission granted to share information with : No              Emotional Assessment Appearance:: Appears stated age Attitude/Demeanor/Rapport: Engaged Affect (typically observed): Accepting Orientation: : Oriented to Self, Oriented to Place, Oriented to  Time, Oriented to Situation Alcohol / Substance Use: Not Applicable Psych Involvement: No (comment)  Admission diagnosis:  Shortness of breath [R06.02] COPD exacerbation (HCC)  [J44.1] NSTEMI (non-ST elevated myocardial infarction) (HCC) [I21.4] Heart failure (HCC) [I50.9] Acute on chronic congestive heart failure, unspecified heart failure type (HCC) [I50.9] Patient Active Problem List   Diagnosis Date Noted   Heart failure (HCC) 01/16/2021   Coronary artery disease of native artery of native heart with stable angina pectoris (HCC) 09/09/2018   Centrilobular emphysema (HCC) 09/09/2018   Acute on chronic congestive heart failure (HCC)    Chronic systolic (congestive) heart failure (HCC) 07/25/2018   S/P CABG x 2 04/13/2012   Mitral regurgitation 02/22/2012   Acute coronary syndrome (HCC) 02/18/2012   Cardiomyopathy, ischemic 02/18/2012   Acute systolic CHF (congestive heart failure) (HCC) 02/18/2012   Tobacco abuse 02/18/2012   Left bundle branch block 02/18/2012   Iron deficiency 02/18/2012   PCP:  Patient, No Pcp Per (Inactive) Pharmacy:   [facility] [address], [place] - [address] [place] [address] Phone: [phone] Fax: [phone]  [facility] [address] [place] [address] Phone: [phone] Fax: [phone]     Social Determinants of Health (SDOH) Interventions    Readmission Risk Interventions Readmission Risk Prevention Plan 07/26/2018  Post Dischage Appt Complete  Medication Screening Complete  Transportation Screening Complete  Some recent data might be hidden

## 2021-01-18 NOTE — Progress Notes (Addendum)
HD#1 SUBJECTIVE:  Patient Summary: Dana [name] is a 75 y.o. with a pertinent PMH of CHF (March 2020 echo with EF <20%), CAD, HLD, s/p CABG, and psoriasis, who presented with increased shortness of breath and admitted for acute on chronic heart failure exacerbation and COPD exacerbation.   Overnight Events: None  Interim History: Patient endorses continued wheezing and discomfort with breathing.  She says that the duo nebs that we are giving her have helped with her breathing a lot.  When asked about her psoriasis, she says it something she has just learned to deal with.  When asked about medication, she says that she stopped taking it because she started feeling better.  OBJECTIVE:  Vital Signs: Vitals:   01/18/21 0008 01/18/21 0429 01/18/21 0936 01/18/21 1228  BP: 108/68 103/83  108/69  Pulse: 89 90  95  Resp: 20 19  16   Temp: 97.6 F (36.4 C) 97.7 F (36.5 C)  97.6 F (36.4 C)  TempSrc: Oral Oral  Oral  SpO2: 100% 99% 98% 100%  Weight:  72.8 kg    Height:       Supplemental O2: Room Air SpO2: 100 % O2 Flow Rate (L/min): 2 L/min  Filed Weights   01/16/21 1709 01/18/21 0429  Weight: 65.8 kg 72.8 kg     Intake/Output Summary (Last 24 hours) at 01/18/2021 1328 Last data filed at 01/18/2021 0430 Gross per 24 hour  Intake 480 ml  Output 850 ml  Net -370 ml   Net IO Since Admission: -1,340 mL [01/18/21 1328]  Physical Exam: Constitutional: Patient is laying comfortably in bed.  No acute distress noted. Neck: JVD noted to angle of mandible Cardio: Regular rate and rhythm.  No murmurs, rubs, gallops. Pulm: Wheezing auscultated diffusely through bilateral lung fields.  Normal work of breathing. Abdomen: Soft, nontender, nondistended MSK: Bilateral 1+ pitting edema  Skin:Psoriatic rash widespread over her body.  Some areas of excoriation noted. Neuro: Alert and oriented x3.  No focal deficit noted. Psych: Appropriate mood and affect.   Patient  Lines/Drains/Airways Status     Active Line/Drains/Airways     Name Placement date Placement time Site Days   Peripheral IV 01/17/21 22 G Anterior;Left Wrist 01/17/21  0823  Wrist  1   Incision 02/23/12 Other (Comment) 02/23/12  1005  -- 3252             ASSESSMENT/PLAN:  Assessment: Active Problems:   Heart failure (HCC)   Plan: #Acute systolic heart failure exacerbation Patient does state that she has not been taking her medications over the last several months.  It is unclear if this is strictly due to affordability as she says that she stopped taking her medications when she began to feel better.  Echocardiogram on 9/18 showed LV EF of 10 to 15%, severely decreased function, global hypokinesis, severe dilation.  It also showed RV systolic function severely decreased, mild enlargement of RV, moderately increased pulmonary artery systolic pressure.  Other findings include LA mildly dilated, severe mitral valve regurgitation. -Heart failure team consulted, appreciate their recommendations -Lasix held due to hypotension, bump in creatinine -Continue telemetry, daily weights, strict I's/O's  #Acute COPD exacerbation Patient endorses 20-pack-year smoking history.  She says that the DuoNebs that she has been receiving have helped with her symptoms of shortness of breath.  She continues to have diffuse expiratory wheezing throughout bilateral lung fields.  She does have a mild cough. -Continue scheduled DuoNebs -Continue prednisone 40 mg daily.  She will likely  need to discharged with a LAMA/LABA combo or LAMA alone.  Wheezing remains substantial today.   #CAD s/p CABG in 2013 On presentation to the ED, patient had a chest-troponins that peaked at 105.  This is thought to be secondary to demand ischemia. -Continue ASA  Best Practice: Diet: Cardiac diet IVF: Fluids: None VTE: enoxaparin (LOVENOX) injection 40 mg Start: 01/16/21 2300 Code: Full AB: None DISPO: Anticipated  discharge in 1-3 days to Home pending Medical stability.  Signature: Champ Mungo, D.O.  Internal Medicine Resident, PGY-1 [facility]  Pager: [phone] 1:28 PM, 01/18/2021   Please contact the on call pager after 5 pm and on weekends at [phone].

## 2021-01-19 DIAGNOSIS — I509 Heart failure, unspecified: Secondary | ICD-10-CM | POA: Diagnosis not present

## 2021-01-19 DIAGNOSIS — I5021 Acute systolic (congestive) heart failure: Secondary | ICD-10-CM | POA: Diagnosis not present

## 2021-01-19 DIAGNOSIS — J441 Chronic obstructive pulmonary disease with (acute) exacerbation: Secondary | ICD-10-CM | POA: Diagnosis not present

## 2021-01-19 LAB — CBC
HCT: 36.7 % (ref 36.0–46.0)
Hemoglobin: 11.5 g/dL — ABNORMAL LOW (ref 12.0–15.0)
MCH: 29.6 pg (ref 26.0–34.0)
MCHC: 31.3 g/dL (ref 30.0–36.0)
MCV: 94.6 fL (ref 80.0–100.0)
Platelets: 258 10*3/uL (ref 150–400)
RBC: 3.88 MIL/uL (ref 3.87–5.11)
RDW: 14.4 % (ref 11.5–15.5)
WBC: 9.7 10*3/uL (ref 4.0–10.5)
nRBC: 0 % (ref 0.0–0.2)

## 2021-01-19 LAB — BASIC METABOLIC PANEL
Anion gap: 10 (ref 5–15)
BUN: 29 mg/dL — ABNORMAL HIGH (ref 8–23)
CO2: 31 mmol/L (ref 22–32)
Calcium: 9.2 mg/dL (ref 8.9–10.3)
Chloride: 97 mmol/L — ABNORMAL LOW (ref 98–111)
Creatinine, Ser: 1.21 mg/dL — ABNORMAL HIGH (ref 0.44–1.00)
GFR, Estimated: 46 mL/min — ABNORMAL LOW (ref >60)
Glucose, Bld: 99 mg/dL (ref 70–99)
Potassium: 4.3 mmol/L (ref 3.5–5.1)
Sodium: 138 mmol/L (ref 135–145)

## 2021-01-19 LAB — MAGNESIUM: Magnesium: 2.3 mg/dL (ref 1.7–2.4)

## 2021-01-19 MED ORDER — UMECLIDINIUM-VILANTEROL 62.5-25 MCG/INH IN AEPB
1.0000 | INHALATION_SPRAY | Freq: Every day | RESPIRATORY_TRACT | Status: DC
  Administered 2021-01-19 – 2021-01-22 (×4): 1 via RESPIRATORY_TRACT
  Filled 2021-01-19: qty 14

## 2021-01-19 MED ORDER — IPRATROPIUM-ALBUTEROL 0.5-2.5 (3) MG/3ML IN SOLN
3.0000 mL | RESPIRATORY_TRACT | Status: DC
  Administered 2021-01-19: 3 mL via RESPIRATORY_TRACT
  Filled 2021-01-19: qty 3

## 2021-01-19 MED ORDER — IPRATROPIUM-ALBUTEROL 0.5-2.5 (3) MG/3ML IN SOLN
3.0000 mL | Freq: Four times a day (QID) | RESPIRATORY_TRACT | Status: DC | PRN
  Administered 2021-01-19 – 2021-01-21 (×5): 3 mL via RESPIRATORY_TRACT
  Filled 2021-01-19 (×5): qty 3

## 2021-01-19 MED ORDER — FUROSEMIDE 10 MG/ML IJ SOLN
100.0000 mg | Freq: Two times a day (BID) | INTRAVENOUS | Status: DC
  Administered 2021-01-19: 100 mg via INTRAVENOUS
  Filled 2021-01-19 (×2): qty 10

## 2021-01-19 MED ORDER — ISOSORBIDE MONONITRATE ER 30 MG PO TB24
15.0000 mg | ORAL_TABLET | Freq: Every day | ORAL | Status: DC
  Administered 2021-01-19: 15 mg via ORAL
  Filled 2021-01-19: qty 1

## 2021-01-19 MED ORDER — FUROSEMIDE 10 MG/ML IJ SOLN
120.0000 mg | Freq: Two times a day (BID) | INTRAVENOUS | Status: DC
  Administered 2021-01-19 – 2021-01-21 (×5): 120 mg via INTRAVENOUS
  Filled 2021-01-19: qty 10
  Filled 2021-01-19 (×2): qty 12
  Filled 2021-01-19 (×2): qty 10
  Filled 2021-01-19: qty 12
  Filled 2021-01-19: qty 2

## 2021-01-19 NOTE — Progress Notes (Signed)
Cardiology Progress Note  Patient ID: Dana [name] MRN: [id number] DOB: Jan 11, 1946 Date of Encounter: 01/19/2021  Primary Cardiologist: Julien Nordmann, MD  Subjective   Chief Complaint: SOB.   HPI: SOB this AM. BP better.   ROS:  All other ROS reviewed and negative. Pertinent positives noted in the HPI.     Inpatient Medications  Scheduled Meds:  amiodarone  200 mg Oral BID   Followed by   Melene Muller ON 01/25/2021] amiodarone  200 mg Oral Daily   aspirin  81 mg Oral Daily   atorvastatin  80 mg Oral q1800   digoxin  0.125 mg Oral Daily   enoxaparin (LOVENOX) injection  40 mg Subcutaneous QHS   hydrALAZINE  25 mg Oral Q8H   ipratropium-albuterol  3 mL Nebulization Q4H   isosorbide mononitrate  15 mg Oral Daily   melatonin  3 mg Oral QHS   potassium chloride  40 mEq Oral BID   predniSONE  40 mg Oral Q breakfast   sodium chloride flush  3 mL Intravenous Q12H   Continuous Infusions:  sodium chloride     furosemide     PRN Meds: sodium chloride, acetaminophen, ondansetron (ZOFRAN) IV, sodium chloride flush   Vital Signs   Vitals:   01/18/21 2028 01/18/21 2140 01/19/21 0447 01/19/21 0830  BP:  124/80 120/64 126/68  Pulse:  77 72 75  Resp:  19 18 18   Temp:  97.7 F (36.5 C) 97.7 F (36.5 C) (!) 97.4 F (36.3 C)  TempSrc:  Oral Oral Oral  SpO2: 98% 100% 98% 99%  Weight:   70.5 kg   Height:        Intake/Output Summary (Last 24 hours) at 01/19/2021 1020 Last data filed at 01/18/2021 1800 Gross per 24 hour  Intake 523.89 ml  Output 850 ml  Net -326.11 ml   Last 3 Weights 01/19/2021 01/18/2021 01/16/2021  Weight (lbs) 155 lb 8 oz 160 lb 8 oz 145 lb  Weight (kg) 70.534 kg 72.802 kg 65.772 kg      Telemetry  Overnight telemetry shows SR 80s, which I personally reviewed.   Physical Exam   Vitals:   01/18/21 2028 01/18/21 2140 01/19/21 0447 01/19/21 0830  BP:  124/80 120/64 126/68  Pulse:  77 72 75  Resp:  19 18 18   Temp:  97.7 F (36.5 C) 97.7 F (36.5 C)  (!) 97.4 F (36.3 C)  TempSrc:  Oral Oral Oral  SpO2: 98% 100% 98% 99%  Weight:   70.5 kg   Height:        Intake/Output Summary (Last 24 hours) at 01/19/2021 1020 Last data filed at 01/18/2021 1800 Gross per 24 hour  Intake 523.89 ml  Output 850 ml  Net -326.11 ml    Last 3 Weights 01/19/2021 01/18/2021 01/16/2021  Weight (lbs) 155 lb 8 oz 160 lb 8 oz 145 lb  Weight (kg) 70.534 kg 72.802 kg 65.772 kg    Body mass index is 29.38 kg/m.   General: ill-appearing  Head: Atraumatic, normal size  Eyes: PEERLA, EOMI  Neck: Supple, JVD up to earlobes  Endocrine: No thryomegaly Cardiac: Normal S1, S2; RRR; no murmurs, rubs, or gallops Lungs: rales and rhonchi Abd: Soft, nontender, no hepatomegaly  Ext: 2+ LE edema  Musculoskeletal: No deformities, BUE and BLE strength normal and equal Skin: +psoriasis  Neuro: Alert and oriented to person, place, time, and situation, CNII-XII grossly intact, no focal deficits  Psych: Normal mood and affect   Labs  High  Sensitivity Troponin:   Recent Labs  Lab 01/16/21 1504 01/17/21 0511  TROPONINIHS 34* 27*     Cardiac EnzymesNo results for input(s): TROPONINI in the last 168 hours. No results for input(s): TROPIPOC in the last 168 hours.  Chemistry Recent Labs  Lab 01/17/21 0511 01/18/21 0639 01/19/21 0338  NA 142 139 138  K 3.8 3.7 4.3  CL 101 98 97*  CO2 26 30 31   GLUCOSE 148* 91 99  BUN 20 30* 29*  CREATININE 1.19* 1.33* 1.21*  CALCIUM 9.3 9.1 9.2  PROT 6.0*  --   --   ALBUMIN 3.1*  --   --   AST 26  --   --   ALT 25  --   --   ALKPHOS 81  --   --   BILITOT 1.3*  --   --   GFRNONAA 47* 41* 46*  ANIONGAP 15 11 10     Hematology Recent Labs  Lab 01/17/21 0511 01/18/21 0639 01/19/21 0338  WBC 5.7 10.5 9.7  RBC 4.08 3.93 3.88  HGB 12.0 11.3* 11.5*  HCT 38.6 37.4 36.7  MCV 94.6 95.2 94.6  MCH 29.4 28.8 29.6  MCHC 31.1 30.2 31.3  RDW 14.6 14.5 14.4  PLT 241 261 258   BNP Recent Labs  Lab 01/16/21 1544  BNP 2,682.2*     DDimer No results for input(s): DDIMER in the last 168 hours.   Radiology  ECHOCARDIOGRAM COMPLETE  Result Date: 01/17/2021    ECHOCARDIOGRAM REPORT   Patient Name:   Dana [name] Date of Exam: 01/17/2021 Medical Rec #:  Sherrell Puller         Height:       61.0 in Accession #:    01/19/2021        Weight:       145.0 lb Date of Birth:  10/02/1945          BSA:          1.647 m Patient Age:    75 years          BP:           121/79 mmHg Patient Gender: F                 HR:           62 bpm. Exam Location:  Inpatient Procedure: 2D Echo, Cardiac Doppler, Color Doppler and Intracardiac            Opacification Agent Indications:    Dyspnea R06.00  History:        Patient has prior history of Echocardiogram examinations. CHF,                 CAD, Arrythmias:LBBB; Risk Factors:Dyslipidemia.  Sonographer:    [id number] RDCS Referring Phys: 10/06/1945 SAMUEL A GRAY IMPRESSIONS  1. Left ventricular ejection fraction, by estimation, is 10-15%. The left ventricle has severely decreased function. The left ventricle demonstrates global hypokinesis. The left ventricular internal cavity size was severely dilated. Indeterminate diastolic filling due to E-A fusion.  2. Right ventricular systolic function is severely reduced. The right ventricular size is mildly enlarged. There is moderately elevated pulmonary artery systolic pressure. The estimated right ventricular systolic pressure is 45.7 mmHg.  3. Left atrial size was mildly dilated.  4. The mitral valve is grossly normal. Severe mitral valve regurgitation. No evidence of mitral stenosis.  5. The aortic valve is tricuspid. There is mild thickening of the aortic valve. Aortic valve regurgitation  is not visualized. No aortic stenosis is present.  6. The inferior vena cava is normal in size with <50% respiratory variability, suggesting right atrial pressure of 8 mmHg. Comparison(s): Changes from prior study are noted. EF unchanged. RVSP elevated compared to prior. Severe  mitral regurgitation is present that is secondary to cardiomyopathy. Conclusion(s)/Recommendation(s): No left ventricular mural or apical thrombus/thrombi. FINDINGS  Left Ventricle: Left ventricular ejection fraction, by estimation, is 10-15%. The left ventricle has severely decreased function. The left ventricle demonstrates global hypokinesis. Definity contrast agent was given IV to delineate the left ventricular endocardial borders. The left ventricular internal cavity size was severely dilated. There is no left ventricular hypertrophy. Indeterminate diastolic filling due to E-A fusion. Right Ventricle: The right ventricular size is mildly enlarged. No increase in right ventricular wall thickness. Right ventricular systolic function is severely reduced. There is moderately elevated pulmonary artery systolic pressure. The tricuspid regurgitant velocity is 3.07 m/s, and with an assumed right atrial pressure of 8 mmHg, the estimated right ventricular systolic pressure is 45.7 mmHg. Left Atrium: Left atrial size was mildly dilated. Right Atrium: Right atrial size was normal in size. Pericardium: Trivial pericardial effusion is present. Mitral Valve: The mitral valve is grossly normal. Severe mitral valve regurgitation. No evidence of mitral valve stenosis. Tricuspid Valve: The tricuspid valve is grossly normal. Tricuspid valve regurgitation is mild . No evidence of tricuspid stenosis. Aortic Valve: The aortic valve is tricuspid. There is mild thickening of the aortic valve. Aortic valve regurgitation is not visualized. No aortic stenosis is present. Aortic valve mean gradient measures 5.0 mmHg. Aortic valve peak gradient measures 8.9 mmHg. Aortic valve area, by VTI measures 0.94 cm. Pulmonic Valve: The pulmonic valve was grossly normal. Pulmonic valve regurgitation is not visualized. No evidence of pulmonic stenosis. Aorta: The aortic root is normal in size and structure. Venous: The inferior vena cava is normal in  size with less than 50% respiratory variability, suggesting right atrial pressure of 8 mmHg. IAS/Shunts: The atrial septum is grossly normal. Additional Comments: A device lead is visualized in the right atrium and right ventricle.  LEFT VENTRICLE PLAX 2D LVIDd:         6.80 cm  Diastology LVIDs:         6.40 cm  LV e' medial:    2.83 cm/s LV PW:         0.90 cm  LV E/e' medial:  38.2 LV IVS:        1.00 cm  LV e' lateral:   9.70 cm/s LVOT diam:     1.90 cm  LV E/e' lateral: 11.1 LV SV:         23 LV SV Index:   14 LVOT Area:     2.84 cm  RIGHT VENTRICLE            IVC RV Basal diam:  4.30 cm    IVC diam: 2.00 cm RV Mid diam:    3.00 cm RV S prime:     7.62 cm/s TAPSE (M-mode): 1.3 cm LEFT ATRIUM             Index       RIGHT ATRIUM           Index LA diam:        4.10 cm 2.49 cm/m  RA Area:     20.80 cm LA Vol (A2C):   49.2 ml 29.86 ml/m RA Volume:   66.30 ml  40.24 ml/m LA Vol (A4C):  43.4 ml 26.34 ml/m LA Biplane Vol: 46.4 ml 28.16 ml/m  AORTIC VALVE AV Area (Vmax):    0.95 cm AV Area (Vmean):   0.88 cm AV Area (VTI):     0.94 cm AV Vmax:           149.00 cm/s AV Vmean:          101.000 cm/s AV VTI:            0.243 m AV Peak Grad:      8.9 mmHg AV Mean Grad:      5.0 mmHg LVOT Vmax:         49.80 cm/s LVOT Vmean:        31.400 cm/s LVOT VTI:          0.080 m LVOT/AV VTI ratio: 0.33  AORTA Ao Root diam: 3.00 cm MITRAL VALVE                 TRICUSPID VALVE MV Area (PHT): 4.80 cm      TR Peak grad:   37.7 mmHg MV Decel Time: 158 msec      TR Vmax:        307.00 cm/s MR Peak grad:    67.2 mmHg MR Mean grad:    42.0 mmHg   SHUNTS MR Vmax:         410.00 cm/s Systemic VTI:  0.08 m MR Vmean:        305.0 cm/s  Systemic Diam: 1.90 cm MR PISA:         1.57 cm MR PISA Eff ROA: 12 mm MR PISA Radius:  0.50 cm MV E velocity: 108.00 cm/s MV A velocity: 66.60 cm/s MV E/A ratio:  1.62 Lennie Odor MD Electronically signed by Lennie Odor MD Signature Date/Time: 01/17/2021/7:41:04 PM    Final     Cardiac Studies   Echo: EF 10-15%, RVSP 45 mmHg, severe mitral regurgitation, LVOT VTI 8 cm Left heart catheterization 07/30/2018: Patent LIMA to LAD, patent vein graft to first diagonal, patent RCA, 80% distal circumflex disease EKG: HR 97 bpm, LBBB, QRS 166 ms  Patient Profile  75 yo F with of CAD s/p CABG, HFrEF/ICM, tobacco abuse, COPD admitted 01/16/2021 for acute on chronic systolic HF and COPD exacerbation  Assessment & Plan   #Acute Systolic CHF, EF 60-45% #Severe functional MR #LBBB, QRS 163 ms -She has had a cardiomyopathy for years.  Has not taken her medications.  Most recent left heart cath in 2020 showed patent bypass grafts.  She has minimal circumflex residual disease but to me her cardiomyopathy is out of proportion to CAD. -She now presents with low blood pressure as well as low LVOT VTI on echo.  I do have concerns that she is developing an advanced cardiomyopathy. -She is not a candidate for transplant given her age. -Given poor compliance as well as CKD, COPD and uncontrolled comorbidities including widespread psoriasis she is not a candidate for advanced therapies.  I believe all I can offer her is medical management. -Blood pressure is better on digoxin 0.125 mg daily.  We will continue this. -Given CKD we will not pursue ACE/ARB/Arni/MRA.  Continue hydralazine 25 mg 3 times daily and Imdur 50 mg daily for afterload reduction. -Increase Lasix to 120 mg IV twice daily. -Mains grossly volume overloaded.  #Severe MR -diuresis   #Atrial tachycardia -continue amiodarone -stable  #COPD exacerbation -continue meds   For questions or updates, please contact [facility] Please consult [email or web address] for contact  info under   Time Spent with Patient: I have spent a total of 35 minutes with patient reviewing hospital notes, telemetry, EKGs, labs and examining the patient as well as establishing an assessment and plan that was discussed with the patient.  > 50% of time was spent in direct  patient care.    Signed, Lenna Gilford. Flora Lipps, MD, [facility] [place]  [facility]  01/19/2021 10:20 AM

## 2021-01-19 NOTE — Progress Notes (Signed)
HD#2 SUBJECTIVE:  Patient Summary: Dana [name] is a 75 y.o. with a pertinent PMH of CHF (March 2020 echo with EF <20%), CAD, HLD, s/p CABG, and psoriasis, who presented with increased shortness of breath and admitted for acute on chronic heart failure exacerbation and COPD exacerbation.   Overnight Events: None  Interim History: Patient endorses that she feels better today than she did yesterday, specifically with her breathing after receiving a breathing treatment this morning and with continued diuresis.  She understands that cardiology has added multiple new medications to her regimen.  OBJECTIVE:  Vital Signs: Vitals:   01/18/21 2028 01/18/21 2140 01/19/21 0447 01/19/21 0830  BP:  124/80 120/64 126/68  Pulse:  77 72 75  Resp:  19 18 18   Temp:  97.7 F (36.5 C) 97.7 F (36.5 C) (!) 97.4 F (36.3 C)  TempSrc:  Oral Oral Oral  SpO2: 98% 100% 98% 99%  Weight:   70.5 kg   Height:       Supplemental O2: Nasal Cannula SpO2: 99 % O2 Flow Rate (L/min): 2 L/min  Filed Weights   01/16/21 1709 01/18/21 0429 01/19/21 0447  Weight: 65.8 kg 72.8 kg 70.5 kg     Intake/Output Summary (Last 24 hours) at 01/19/2021 1121 Last data filed at 01/18/2021 1800 Gross per 24 hour  Intake 523.89 ml  Output 850 ml  Net -326.11 ml   Net IO Since Admission: -1,666.11 mL [01/19/21 1121]  Physical Exam: Constitutional:Patient is sitting comfortably in bed, eating lunch. No acute distress noted. Cardio:Regular rate and rhythm. No murmurs, rubs, gallops. Pulm: Diffuse inspiratory and expiratory wheezes to bilateral lung fields. Abdomen: Soft, nontender, nondistended. MSK: Bilateral lower extremity 2+ pitting edema. Skin: Psoriatic rash diffusely over her entire body with some excoriations. Neuro: Alert and oriented x3.  No focal deficit noted. Psych: Appropriate mood and affect.   Patient Lines/Drains/Airways Status     Active Line/Drains/Airways     Name Placement date Placement  time Site Days   Peripheral IV 01/17/21 22 G Anterior;Left Wrist 01/17/21  0823  Wrist  2   Incision 02/23/12 Other (Comment) 02/23/12  1005  -- 3253             ASSESSMENT/PLAN:  Assessment: Active Problems:   Heart failure (HCC)   COPD exacerbation (HCC)   Plan: #Acute systolic heart failure exacerbation Patient endorses feeling better today as compared to admission, notes that she is urinating frequently and feels better as more fluid is removed.  Echocardiogram on 9/18 showed LV EF of 10 to 15%, severe mitral valve regurgitation likely secondary to cardiomyopathy. -Heart failure team consulted, appreciate their recommendations  -After evaluating the patient, heart failure team does not feel that she is a candidate for heart transplant given her age, nor is she a good candidate for LVAD given CKD, age, other comorbidities. In their opinion, at this time her only option is medical management.  -Initiated on digoxin 0.125 mg daily to increase blood pressure  -Initiated on hydralazine 25 mg 3 times daily daily for afterload reduction  -Restarted on Lasix 120 mg IV twice daily  -Initiated on amiodarone for atrial tachycardia  -Initiated on Imdur 50 mg daily for afterload reduction  -The recommendation is that if her kidneys continue to decline and medical management does not improve her blood pressure, hospice would be a good consideration. -Continue telemetry, daily weights, strict I's/O's   #Acute COPD exacerbation Patient continues to have diffuse expiratory wheezing throughout bilateral lung fields and  though this is improved from exam yesterday, she does still require frequent breathing treatments.   -Initiate Anoro Ellipta, 1 puff inhaled daily -Continue prednisone 40 mg daily.  We will taper this as discharge approaches.   #CAD s/p CABG in 2013 On presentation to the ED, patient had a chest-troponins that peaked at 50.  This is thought to be secondary to demand  ischemia. -Continue ASA  Best Practice: Diet: Cardiac diet IVF: Fluids: None VTE: enoxaparin (LOVENOX) injection 40 mg Start: 01/16/21 2300 Code: Full AB: None DISPO: Continue to monitor inpatient.  Signature: Champ Mungo, D.O.  Internal Medicine Resident, PGY-1 [facility]  Pager: [phone] 11:21 AM, 01/19/2021   Please contact the on call pager after 5 pm and on weekends at [phone].

## 2021-01-19 NOTE — Progress Notes (Addendum)
See other progress note.  Gerri Spore T. Flora Lipps, MD, [facility]  [facility]  [address], [address] [place], [place] [address] [phone]  10:44 AM

## 2021-01-19 NOTE — Progress Notes (Signed)
Pt complaining of having a hard time breathing asked for breathing tx paged MD and notified waiting for orders.

## 2021-01-20 ENCOUNTER — Telehealth: Payer: Self-pay | Admitting: Student

## 2021-01-20 ENCOUNTER — Other Ambulatory Visit (HOSPITAL_COMMUNITY): Payer: Self-pay

## 2021-01-20 DIAGNOSIS — I471 Supraventricular tachycardia: Secondary | ICD-10-CM

## 2021-01-20 DIAGNOSIS — I25810 Atherosclerosis of coronary artery bypass graft(s) without angina pectoris: Secondary | ICD-10-CM

## 2021-01-20 DIAGNOSIS — I509 Heart failure, unspecified: Secondary | ICD-10-CM | POA: Diagnosis not present

## 2021-01-20 DIAGNOSIS — I5021 Acute systolic (congestive) heart failure: Secondary | ICD-10-CM | POA: Diagnosis not present

## 2021-01-20 DIAGNOSIS — J441 Chronic obstructive pulmonary disease with (acute) exacerbation: Secondary | ICD-10-CM | POA: Diagnosis not present

## 2021-01-20 LAB — POCT I-STAT EG7
Acid-Base Excess: 4 mmol/L — ABNORMAL HIGH (ref 0.0–2.0)
Bicarbonate: 25 mmol/L (ref 20.0–28.0)
Calcium, Ion: 0.82 mmol/L — CL (ref 1.15–1.40)
HCT: 40 % (ref 36.0–46.0)
Hemoglobin: 13.6 g/dL (ref 12.0–15.0)
O2 Saturation: 93 %
Potassium: 7.5 mmol/L (ref 3.5–5.1)
Sodium: 136 mmol/L (ref 135–145)
TCO2: 26 mmol/L (ref 22–32)
pCO2, Ven: 27.4 mmHg — ABNORMAL LOW (ref 44.0–60.0)
pH, Ven: 7.568 — ABNORMAL HIGH (ref 7.250–7.430)
pO2, Ven: 56 mmHg — ABNORMAL HIGH (ref 32.0–45.0)

## 2021-01-20 LAB — CBC
HCT: 37.9 % (ref 36.0–46.0)
Hemoglobin: 11.4 g/dL — ABNORMAL LOW (ref 12.0–15.0)
MCH: 28.2 pg (ref 26.0–34.0)
MCHC: 30.1 g/dL (ref 30.0–36.0)
MCV: 93.8 fL (ref 80.0–100.0)
Platelets: 274 10*3/uL (ref 150–400)
RBC: 4.04 MIL/uL (ref 3.87–5.11)
RDW: 14.3 % (ref 11.5–15.5)
WBC: 10.7 10*3/uL — ABNORMAL HIGH (ref 4.0–10.5)
nRBC: 0 % (ref 0.0–0.2)

## 2021-01-20 LAB — BASIC METABOLIC PANEL
Anion gap: 10 (ref 5–15)
BUN: 26 mg/dL — ABNORMAL HIGH (ref 8–23)
CO2: 33 mmol/L — ABNORMAL HIGH (ref 22–32)
Calcium: 9.2 mg/dL (ref 8.9–10.3)
Chloride: 96 mmol/L — ABNORMAL LOW (ref 98–111)
Creatinine, Ser: 1.27 mg/dL — ABNORMAL HIGH (ref 0.44–1.00)
GFR, Estimated: 44 mL/min — ABNORMAL LOW (ref >60)
Glucose, Bld: 100 mg/dL — ABNORMAL HIGH (ref 70–99)
Potassium: 4.4 mmol/L (ref 3.5–5.1)
Sodium: 139 mmol/L (ref 135–145)

## 2021-01-20 LAB — PARATHYROID HORMONE, INTACT (NO CA): PTH: 61 pg/mL (ref 15–65)

## 2021-01-20 LAB — MAGNESIUM: Magnesium: 2.4 mg/dL (ref 1.7–2.4)

## 2021-01-20 MED ORDER — PREDNISONE 20 MG PO TABS
40.0000 mg | ORAL_TABLET | Freq: Every day | ORAL | Status: DC
  Administered 2021-01-21: 40 mg via ORAL
  Filled 2021-01-20: qty 2

## 2021-01-20 MED ORDER — EMPAGLIFLOZIN 10 MG PO TABS
10.0000 mg | ORAL_TABLET | Freq: Every day | ORAL | Status: DC
  Administered 2021-01-20 – 2021-01-22 (×3): 10 mg via ORAL
  Filled 2021-01-20 (×3): qty 1

## 2021-01-20 MED ORDER — ISOSORBIDE MONONITRATE ER 30 MG PO TB24
30.0000 mg | ORAL_TABLET | Freq: Every day | ORAL | Status: DC
  Administered 2021-01-20 – 2021-01-22 (×3): 30 mg via ORAL
  Filled 2021-01-20 (×3): qty 1

## 2021-01-20 MED ORDER — CARVEDILOL 3.125 MG PO TABS
3.1250 mg | ORAL_TABLET | Freq: Two times a day (BID) | ORAL | Status: DC
  Administered 2021-01-20 – 2021-01-22 (×5): 3.125 mg via ORAL
  Filled 2021-01-20 (×5): qty 1

## 2021-01-20 NOTE — Care Management Important Message (Signed)
Important Message  Patient Details  Name: Mercede Rollo MRN: [id number] Date of Birth: 10-24-1945   Medicare Important Message Given:  Yes     Brionna Romanek Stefan Church 01/20/2021, 11:54 AM

## 2021-01-20 NOTE — Telephone Encounter (Signed)
TOC HFU APPT [facility] FOR THE FOLLOWING:  Date: 01/28/2021 Status: Sch  Time: 3:15 PM Length: 60       Provider: Chauncey Mann, DO Department: [facility]        NEW HFU

## 2021-01-20 NOTE — TOC Benefit Eligibility Note (Signed)
Patient Product/process development scientist completed.    The patient is currently admitted and upon discharge could be taking Jardiance 10 mg.  The current 30 day co-pay is, $482.00 due to a $435.00 deductible remaining.   The patient is currently admitted and upon discharge could be taking Anoro Ellipta 62.5-25 mcg.  The current 30 day co-pay is, $479.02 due to a $435.00 deductible remaining.   The patient is insured through [employer] Medicare Part D     Roland Earl, CPhT Pharmacy Patient Advocate Specialist [facility] Antimicrobial Stewardship Team Direct Number: [phone]  Fax: [phone]

## 2021-01-20 NOTE — Progress Notes (Signed)
HD#3 SUBJECTIVE:  Patient Summary: Dana [name] is a 75 y.o. with a pertinent PMH of CHF (March 2020 echo with EF <20%), CAD, HLD, s/p CABG, CKD stage III, and psoriasis, who presented with increased shortness of breath and admitted for acute on chronic heart failure exacerbation and COPD exacerbation.   Overnight Events: None  Interim History: Patient reports improvement in her breathing symptoms since transitioning to an oral Ellipta yesterday.  She states she has been able to get out of bed and walk around her room without shortness of breath exacerbations.  OBJECTIVE:  Vital Signs: Vitals:   01/20/21 0002 01/20/21 0448 01/20/21 1213 01/20/21 1408  BP: (!) 107/53 (!) 106/52 (!) 92/46 (!) 109/59  Pulse: 67 70 78   Resp: 18 18 16    Temp: 97.8 F (36.6 C) 97.8 F (36.6 C) 98.1 F (36.7 C)   TempSrc: Oral Oral Oral   SpO2: 100% 100% 99%   Weight:  67.8 kg    Height:       Supplemental O2: Nasal Cannula SpO2: 99 % O2 Flow Rate (L/min): 2 L/min  Filed Weights   01/18/21 0429 01/19/21 0447 01/20/21 0448  Weight: 72.8 kg 70.5 kg 67.8 kg     Intake/Output Summary (Last 24 hours) at 01/20/2021 1449 Last data filed at 01/19/2021 2230 Gross per 24 hour  Intake 253.57 ml  Output --  Net 253.57 ml   Net IO Since Admission: -1,412.54 mL [01/20/21 1449]  Physical Exam: Constitutional: Patient is resting comfortably in bed on entry to her room.  No acute distress noted. Cardio: Regular rate and rhythm.  No murmurs, rubs, gallops. Pulm: Patient is breathing comfortably on nasal cannula.  Wheezes appreciated diffusely throughout bilateral lung fields. Abdomen: Soft, nontender, nondistended. MSK: 1+ pitting edema to bilateral lower extremities. Skin: Psoriatic rash with excoriations over entire body. Neuro: Alert and oriented x3.  No focal deficit noted. Psych: Appropriate mood and affect.   Patient Lines/Drains/Airways Status     Active Line/Drains/Airways     Name  Placement date Placement time Site Days   Peripheral IV 01/20/21 22 G 1.75" Anterior;Right Forearm 01/20/21  0908  Forearm  less than 1   Incision 02/23/12 Other (Comment) 02/23/12  1005  -- 3254             ASSESSMENT/PLAN:  Assessment: Active Problems:   Heart failure (HCC)   COPD exacerbation (HCC)   Plan: #Acute systolic heart failure exacerbation Patient endorses continued improvement in lower extremity edema.  She reports that she is urinating frequently, however I's/O's documentation reflects otherwise.  There is concern of whether or not all output has been recorded. -Heart failure team consulted, appreciate their recommendations  -Patient has not a candidate for heart transplant or LVAD given her age, CKD, other comorbidities. -Continue digoxin 0.125 mg daily to increase blood pressure -Continue hydralazine 25 mg 3 times daily daily and Imdur 50 mg daily for afterload reduction -Continue Lasix 120 mg IV twice daily -Continue amiodarone 200 mg twice daily and carvedilol 3.125 mg twice daily for atrial tachycardia -Continue telemetry, daily weights, strict I's/O's  #Acute COPD exacerbation On physical exam, wheezes are appreciated diffusely throughout bilateral lung fields.  Patient has noticeably less outward symptoms of shortness of breath.  She endorses improvement of breathing symptoms after initiation of Anora Ellipta yesterday. -Continue Anoro Ellipta 1 puff inhaled daily -Continue DuoNeb 3 mL nebulization every 6 hours as needed wheezing, shortness of breath -Continue prednisone 40 mg daily.  We will  taper this as discharge approaches.  #CAD status post CABG in 2013 Patient denies chest pain at this time. -Continue aspirin 81 mg daily -Continue atorvastatin 80 mg daily  #CKD stage III Kidney function has been stable throughout this day.  Morning labs showed BUN/creatinine 26/1.27 and GFR 44.  Best Practice: Diet: Cardiac diet IVF: Fluids: None VTE:  enoxaparin (LOVENOX) injection 40 mg Start: 01/16/21 2300 Code: Full AB: None DISPO: Continue to monitor inpatient.  Signature: Champ Mungo, D.O.  Internal Medicine Resident, PGY-1 [facility]  Pager: [phone] 2:49 PM, 01/20/2021   Please contact the on call pager after 5 pm and on weekends at [phone].

## 2021-01-20 NOTE — Progress Notes (Signed)
Cardiology Progress Note  Patient ID: Yatziri Wainwright MRN: [id number] DOB: 07-Aug-1945 Date of Encounter: 01/20/2021  Primary Cardiologist: Julien Nordmann, MD  Subjective   Chief Complaint: SOB  HPI: Remains volume overloaded. Still SOB. I/Os inaccurate.   ROS:  All other ROS reviewed and negative. Pertinent positives noted in the HPI.     Inpatient Medications  Scheduled Meds:  amiodarone  200 mg Oral BID   Followed by   Melene Muller ON 01/25/2021] amiodarone  200 mg Oral Daily   aspirin  81 mg Oral Daily   atorvastatin  80 mg Oral q1800   digoxin  0.125 mg Oral Daily   empagliflozin  10 mg Oral Daily   enoxaparin (LOVENOX) injection  40 mg Subcutaneous QHS   hydrALAZINE  25 mg Oral Q8H   isosorbide mononitrate  30 mg Oral Daily   melatonin  3 mg Oral QHS   potassium chloride  40 mEq Oral BID   sodium chloride flush  3 mL Intravenous Q12H   umeclidinium-vilanterol  1 puff Inhalation Daily   Continuous Infusions:  sodium chloride     furosemide 120 mg (01/20/21 0943)   PRN Meds: sodium chloride, acetaminophen, ipratropium-albuterol, ondansetron (ZOFRAN) IV, sodium chloride flush   Vital Signs   Vitals:   01/19/21 1207 01/19/21 1658 01/20/21 0002 01/20/21 0448  BP: (!) 114/47 (!) 113/54 (!) 107/53 (!) 106/52  Pulse: 76 79 67 70  Resp: 18 18 18 18   Temp: 97.9 F (36.6 C) 98 F (36.7 C) 97.8 F (36.6 C) 97.8 F (36.6 C)  TempSrc: Oral Oral Oral Oral  SpO2: 100% 96% 100% 100%  Weight:    67.8 kg  Height:        Intake/Output Summary (Last 24 hours) at 01/20/2021 0951 Last data filed at 01/19/2021 2230 Gross per 24 hour  Intake 253.57 ml  Output --  Net 253.57 ml   Last 3 Weights 01/20/2021 01/19/2021 01/18/2021  Weight (lbs) 149 lb 8 oz 155 lb 8 oz 160 lb 8 oz  Weight (kg) 67.813 kg 70.534 kg 72.802 kg      Telemetry  Overnight telemetry shows SR 80s, which I personally reviewed.   Physical Exam   Vitals:   01/19/21 1207 01/19/21 1658 01/20/21 0002 01/20/21  0448  BP: (!) 114/47 (!) 113/54 (!) 107/53 (!) 106/52  Pulse: 76 79 67 70  Resp: 18 18 18 18   Temp: 97.9 F (36.6 C) 98 F (36.7 C) 97.8 F (36.6 C) 97.8 F (36.6 C)  TempSrc: Oral Oral Oral Oral  SpO2: 100% 96% 100% 100%  Weight:    67.8 kg  Height:        Intake/Output Summary (Last 24 hours) at 01/20/2021 0951 Last data filed at 01/19/2021 2230 Gross per 24 hour  Intake 253.57 ml  Output --  Net 253.57 ml    Last 3 Weights 01/20/2021 01/19/2021 01/18/2021  Weight (lbs) 149 lb 8 oz 155 lb 8 oz 160 lb 8 oz  Weight (kg) 67.813 kg 70.534 kg 72.802 kg    Body mass index is 28.25 kg/m.   General: Well nourished, well developed, in no acute distress Head: Atraumatic, normal size  Eyes: PEERLA, EOMI  Neck: Supple, JVD 12-15 cm H20 Endocrine: No thryomegaly Cardiac: Normal S1, S2; RRR; no murmurs, rubs, or gallops Lungs: rales/rhonchi bilaterally  Abd: Soft, nontender, no hepatomegaly  Ext: 2+ pitting edema  Musculoskeletal: No deformities, BUE and BLE strength normal and equal Skin: Warm and dry, no rashes  Neuro: Alert and oriented to person, place, time, and situation, CNII-XII grossly intact, no focal deficits  Psych: Normal mood and affect   Labs  High Sensitivity Troponin:   Recent Labs  Lab 01/16/21 1504 01/17/21 0511  TROPONINIHS 34* 27*     Cardiac EnzymesNo results for input(s): TROPONINI in the last 168 hours. No results for input(s): TROPIPOC in the last 168 hours.  Chemistry Recent Labs  Lab 01/17/21 0511 01/18/21 0639 01/19/21 0338 01/20/21 0248  NA 142 139 138 139  K 3.8 3.7 4.3 4.4  CL 101 98 97* 96*  CO2 26 30 31  33*  GLUCOSE 148* 91 99 100*  BUN 20 30* 29* 26*  CREATININE 1.19* 1.33* 1.21* 1.27*  CALCIUM 9.3 9.1 9.2 9.2  PROT 6.0*  --   --   --   ALBUMIN 3.1*  --   --   --   AST 26  --   --   --   ALT 25  --   --   --   ALKPHOS 81  --   --   --   BILITOT 1.3*  --   --   --   GFRNONAA 47* 41* 46* 44*  ANIONGAP 15 11 10 10      Hematology Recent Labs  Lab 01/18/21 0639 01/19/21 0338 01/20/21 0248  WBC 10.5 9.7 10.7*  RBC 3.93 3.88 4.04  HGB 11.3* 11.5* 11.4*  HCT 37.4 36.7 37.9  MCV 95.2 94.6 93.8  MCH 28.8 29.6 28.2  MCHC 30.2 31.3 30.1  RDW 14.5 14.4 14.3  PLT 261 258 274   BNP Recent Labs  Lab 01/16/21 1544  BNP 2,682.2*    DDimer No results for input(s): DDIMER in the last 168 hours.   Radiology  No results found.  Cardiac Studies  TTE 01/17/2021  1. Left ventricular ejection fraction, by estimation, is 10-15%. The left  ventricle has severely decreased function. The left ventricle demonstrates  global hypokinesis. The left ventricular internal cavity size was severely  dilated. Indeterminate  diastolic filling due to E-A fusion.   2. Right ventricular systolic function is severely reduced. The right  ventricular size is mildly enlarged. There is moderately elevated  pulmonary artery systolic pressure. The estimated right ventricular  systolic pressure is 45.7 mmHg.   3. Left atrial size was mildly dilated.   4. The mitral valve is grossly normal. Severe mitral valve regurgitation.  No evidence of mitral stenosis.   5. The aortic valve is tricuspid. There is mild thickening of the aortic  valve. Aortic valve regurgitation is not visualized. No aortic stenosis is  present.   6. The inferior vena cava is normal in size with <50% respiratory  variability, suggesting right atrial pressure of 8 mmHg.   Patient Profile  75 yo F with of CAD s/p CABG, HFrEF/ICM, tobacco abuse, COPD admitted 01/16/2021 for acute on chronic systolic HF and COPD exacerbation.  Assessment & Plan   #Acute systolic heart failure, EF 10-50% #Severe functional mitral regurgitation #Left bundle branch block, QRS 163 ms -Suspect this is nonischemic cardiomyopathy related to medication noncompliance.  She stopped taking her medications.   -I do suspect some of her cardiomyopathy is related to her left bundle branch  block.  We will get her back on guideline directed medical therapy and then reassess her LVEF in 3 to 6 months.  If no improvement she should likely be considered for CRT-D. -History of CAD status post CABG but vein grafts on cath  in 2020 were patent.  Minimal circumflex disease.  Cardiomyopathy is clearly out of proportion to CAD. -Now with severely reduced LVEF and low LVOT VTI on echo.  This is concerns for very advanced cardiomyopathy. -Not a candidate for transplant due to age.  Not a candidate for advanced therapies such as LVAD given CKD, COPD and uncontrolled comorbidities. -Option currently is medical management. -BP much improved on digoxin 0.25 mg daily.  Continue this. -I will try to get her on Coreg 3.125 mg twice daily. -Not a candidate for ACE/ARB/Arni/MRA due to CKD. -Continue hydralazine and Imdur. -Still volume up.  I believe her I's and O's are inaccurate.  Please ensure that nursing records he is accurately. -Wt down from 72.8 kg -> 67.8 kg.  -on SGLT2. May not be able to afford at discharge.  -kidney function stable.   #CKD IIIb -stable.   #Atrial tachycardia -was having frequent A tach episodes. Started on amiodarone.  -BB added as above.   #CAD status post CABG -ASA/statin.  #COPD exacerbation -per teaching service.   For questions or updates, please contact [facility] Please consult [email or web address] for contact info under   Time Spent with Patient: I have spent a total of 35 minutes with patient reviewing hospital notes, telemetry, EKGs, labs and examining the patient as well as establishing an assessment and plan that was discussed with the patient.  > 50% of time was spent in direct patient care.    Signed, Lenna Gilford. Flora Lipps, MD, [facility] [place]  [facility]  01/20/2021 9:51 AM

## 2021-01-21 ENCOUNTER — Telehealth: Payer: Self-pay | Admitting: *Deleted

## 2021-01-21 DIAGNOSIS — I5021 Acute systolic (congestive) heart failure: Secondary | ICD-10-CM | POA: Diagnosis not present

## 2021-01-21 DIAGNOSIS — I509 Heart failure, unspecified: Secondary | ICD-10-CM | POA: Diagnosis not present

## 2021-01-21 DIAGNOSIS — J441 Chronic obstructive pulmonary disease with (acute) exacerbation: Secondary | ICD-10-CM | POA: Diagnosis not present

## 2021-01-21 LAB — MAGNESIUM: Magnesium: 2.3 mg/dL (ref 1.7–2.4)

## 2021-01-21 LAB — BASIC METABOLIC PANEL
Anion gap: 8 (ref 5–15)
BUN: 33 mg/dL — ABNORMAL HIGH (ref 8–23)
CO2: 38 mmol/L — ABNORMAL HIGH (ref 22–32)
Calcium: 9.4 mg/dL (ref 8.9–10.3)
Chloride: 92 mmol/L — ABNORMAL LOW (ref 98–111)
Creatinine, Ser: 1.39 mg/dL — ABNORMAL HIGH (ref 0.44–1.00)
GFR, Estimated: 39 mL/min — ABNORMAL LOW (ref >60)
Glucose, Bld: 86 mg/dL (ref 70–99)
Potassium: 4.4 mmol/L (ref 3.5–5.1)
Sodium: 138 mmol/L (ref 135–145)

## 2021-01-21 LAB — CBC
HCT: 37.7 % (ref 36.0–46.0)
Hemoglobin: 11.7 g/dL — ABNORMAL LOW (ref 12.0–15.0)
MCH: 28.9 pg (ref 26.0–34.0)
MCHC: 31 g/dL (ref 30.0–36.0)
MCV: 93.1 fL (ref 80.0–100.0)
Platelets: 290 10*3/uL (ref 150–400)
RBC: 4.05 MIL/uL (ref 3.87–5.11)
RDW: 14.2 % (ref 11.5–15.5)
WBC: 9.7 10*3/uL (ref 4.0–10.5)
nRBC: 0 % (ref 0.0–0.2)

## 2021-01-21 LAB — DIGOXIN LEVEL: Digoxin Level: 0.5 ng/mL — ABNORMAL LOW (ref 0.8–2.0)

## 2021-01-21 MED ORDER — ENOXAPARIN SODIUM 30 MG/0.3ML IJ SOSY
30.0000 mg | PREFILLED_SYRINGE | Freq: Every day | INTRAMUSCULAR | Status: DC
  Administered 2021-01-21: 30 mg via SUBCUTANEOUS
  Filled 2021-01-21: qty 0.3

## 2021-01-21 MED ORDER — ACETAZOLAMIDE 250 MG PO TABS
500.0000 mg | ORAL_TABLET | Freq: Once | ORAL | Status: AC
  Administered 2021-01-21: 500 mg via ORAL
  Filled 2021-01-21: qty 2

## 2021-01-21 NOTE — Progress Notes (Signed)
HD#4 SUBJECTIVE:  Patient Summary: Dana [name] is a 75 y.o. with a pertinent PMH of CHF (March 2020 echo with EF <20%), CAD, HLD, s/p CABG, CKD stage III, and psoriasis, who presented with increased shortness of breath and admitted for acute on chronic heart failure exacerbation and COPD exacerbation.   Overnight Events: None  Interim History: Patient reports feeling well with an oral Ellipta breathing treatment.  She has been able to get up and move around her room without shortness of breath, chest pain.  She denies headache, weakness.  She is eager to go home however understands that she needs to remain in the hospital until we find a safe medication regimen for her to discharge with.  OBJECTIVE:  Vital Signs: Vitals:   01/20/21 2245 01/21/21 0445 01/21/21 0811 01/21/21 1156  BP: (!) 93/49 (!) 99/59 (!) 111/53 (!) 114/59  Pulse: 74 61 74 61  Resp: 19 18  18   Temp: 97.8 F (36.6 C) 97.9 F (36.6 C)  97.9 F (36.6 C)  TempSrc: Oral Oral  Oral  SpO2: 99% 99%  98%  Weight:  64.3 kg    Height:       Supplemental O2: Nasal Cannula SpO2: 98 % O2 Flow Rate (L/min): 2 L/min  Filed Weights   01/19/21 0447 01/20/21 0448 01/21/21 0445  Weight: 70.5 kg 67.8 kg 64.3 kg     Intake/Output Summary (Last 24 hours) at 01/21/2021 1450 Last data filed at 01/21/2021 1158 Gross per 24 hour  Intake 1100 ml  Output 1800 ml  Net -700 ml   Net IO Since Admission: -2,112.54 mL [01/21/21 1450]  Physical Exam: Constitutional: Patient is resting company in bed.  No acute distress noted. Cardio: Regular rate and rhythm.  No murmurs, rubs, gallops. Pulm: Patient is breathing comfortably on nasal cannula.  Wheezes are appreciated diffusely throughout bilateral lung fields, however are improved from yesterday. Abdomen: Soft, nontender, nondistended. MSK: Bilateral 1+ pitting edema to lower extremities. Skin: Diffuse psoriatic rash with excoriations. Neuro: Alert and oriented x3.  No focal  deficit noted. Psych: Appropriate mood and affect.  Patient Lines/Drains/Airways Status     Active Line/Drains/Airways     Name Placement date Placement time Site Days   Peripheral IV 01/20/21 22 G 1.75" Anterior;Right Forearm 01/20/21  0908  Forearm  1   Incision 02/23/12 Other (Comment) 02/23/12  1005  -- 3255             ASSESSMENT/PLAN:  Assessment: Active Problems:   Heart failure (HCC)   COPD exacerbation (HCC)   Plan: #Acute systolic heart failure exacerbation Patient endorses continued improvement in lower extremity edema.  She reports that she is urinating frequently, urine output 9/21 of 1.8 L.  Weight is decreasing appropriately. -Heart failure team consulted, appreciate their recommendations             -Patient has not a candidate for heart transplant or LVAD given her age, CKD, other comorbidities. -Continue digoxin 0.125 mg daily to increase blood pressure -Continue hydralazine 25 mg 3 times daily daily and Imdur 30 mg daily for afterload reduction -Continue Lasix 120 mg IV twice daily -Continue amiodarone 200 mg twice daily and carvedilol 3.125 mg twice daily for atrial tachycardia -Continue Jardiance 10 mg daily -Continue telemetry, daily weights, strict I's/O's   #Acute COPD exacerbation On physical exam, wheezes are appreciated diffusely throughout bilateral lung fields though this is improved from yesterday.  Patient has noticeably less outward symptoms of shortness of breath.  She  endorses improvement of breathing symptoms after initiation of Anora Ellipta. -Continue Anoro Ellipta 1 puff inhaled daily -Continue DuoNeb 3 mL nebulization every 6 hours as needed wheezing, shortness of breath -Patient is on day 5 of prednisone 40 mg. Will decrease to 20 mg tomorrow for 3 days, then discontinue.   #CAD status post CABG in 2013 Patient denies chest pain at this time. -Continue aspirin 81 mg daily -Continue atorvastatin 80 mg daily   #CKD stage  III Kidney function has been stable throughout this day.  Morning labs showed BUN/creatinine 33/1.39 and GFR 39.  Best Practice: Diet: Cardiac diet IVF: Fluids: None VTE: enoxaparin (LOVENOX) injection 30 mg Start: 01/21/21 2200 Code: Full AB: None DISPO: Continue to monitor inpatient.  Signature: Champ Mungo, D.O.  Internal Medicine Resident, PGY-1 [facility]  Pager: [phone] 2:50 PM, 01/21/2021   Please contact the on call pager after 5 pm and on weekends at [phone].

## 2021-01-21 NOTE — Telephone Encounter (Signed)
Patient remains in hospital.  No call needed at this time.  Angelina Ok, RN 01/21/2021  2:25 PM.

## 2021-01-21 NOTE — Progress Notes (Signed)
Cardiology Progress Note  Patient ID: Dana [name] MRN: [id number] DOB: 07-Feb-1946 Date of Encounter: 01/21/2021  Primary Cardiologist: Julien Nordmann, MD  Subjective   Chief Complaint: SOB  HPI: SOB improving.  -1.8 L overnight.  Kidney function remained stable.  Blood pressure stable.  ROS:  All other ROS reviewed and negative. Pertinent positives noted in the HPI.     Inpatient Medications  Scheduled Meds:  amiodarone  200 mg Oral BID   Followed by   Melene Muller ON 01/25/2021] amiodarone  200 mg Oral Daily   aspirin  81 mg Oral Daily   atorvastatin  80 mg Oral q1800   carvedilol  3.125 mg Oral BID WC   digoxin  0.125 mg Oral Daily   empagliflozin  10 mg Oral Daily   enoxaparin (LOVENOX) injection  40 mg Subcutaneous QHS   hydrALAZINE  25 mg Oral Q8H   isosorbide mononitrate  30 mg Oral Daily   melatonin  3 mg Oral QHS   potassium chloride  40 mEq Oral BID   predniSONE  40 mg Oral Q breakfast   sodium chloride flush  3 mL Intravenous Q12H   umeclidinium-vilanterol  1 puff Inhalation Daily   Continuous Infusions:  sodium chloride     furosemide 120 mg (01/21/21 0819)   PRN Meds: sodium chloride, acetaminophen, ipratropium-albuterol, ondansetron (ZOFRAN) IV, sodium chloride flush   Vital Signs   Vitals:   01/20/21 1816 01/20/21 2245 01/21/21 0445 01/21/21 0811  BP: (!) 109/58 (!) 93/49 (!) 99/59 (!) 111/53  Pulse: 66 74 61 74  Resp: 16 19 18    Temp: 97.8 F (36.6 C) 97.8 F (36.6 C) 97.9 F (36.6 C)   TempSrc: Oral Oral Oral   SpO2: 99% 99% 99%   Weight:   64.3 kg   Height:        Intake/Output Summary (Last 24 hours) at 01/21/2021 1147 Last data filed at 01/21/2021 0452 Gross per 24 hour  Intake 600 ml  Output 1800 ml  Net -1200 ml   Last 3 Weights 01/21/2021 01/20/2021 01/19/2021  Weight (lbs) 141 lb 11.2 oz 149 lb 8 oz 155 lb 8 oz  Weight (kg) 64.275 kg 67.813 kg 70.534 kg      Telemetry  Overnight telemetry shows sinus rhythm in the 70 to 80 bpm  range, which I personally reviewed.   Physical Exam   Vitals:   01/20/21 1816 01/20/21 2245 01/21/21 0445 01/21/21 0811  BP: (!) 109/58 (!) 93/49 (!) 99/59 (!) 111/53  Pulse: 66 74 61 74  Resp: 16 19 18    Temp: 97.8 F (36.6 C) 97.8 F (36.6 C) 97.9 F (36.6 C)   TempSrc: Oral Oral Oral   SpO2: 99% 99% 99%   Weight:   64.3 kg   Height:        Intake/Output Summary (Last 24 hours) at 01/21/2021 1147 Last data filed at 01/21/2021 0452 Gross per 24 hour  Intake 600 ml  Output 1800 ml  Net -1200 ml    Last 3 Weights 01/21/2021 01/20/2021 01/19/2021  Weight (lbs) 141 lb 11.2 oz 149 lb 8 oz 155 lb 8 oz  Weight (kg) 64.275 kg 67.813 kg 70.534 kg    Body mass index is 26.77 kg/m.   General: Well nourished, well developed, in no acute distress Head: Atraumatic, normal size  Eyes: PEERLA, EOMI  Neck: Supple, JVD 8 to 10 cm of water Endocrine: No thryomegaly Cardiac: Crackles at the lung bases, Rales Lungs: Clear to auscultation bilaterally,  no wheezing, rhonchi or rales  Abd: Soft, nontender, no hepatomegaly  Ext: 1+ edema Musculoskeletal: No deformities, BUE and BLE strength normal and equal Skin: Warm and dry, no rashes   Neuro: Alert and oriented to person, place, time, and situation, CNII-XII grossly intact, no focal deficits  Psych: Normal mood and affect   Labs  High Sensitivity Troponin:   Recent Labs  Lab 01/16/21 1504 01/17/21 0511  TROPONINIHS 34* 27*     Cardiac EnzymesNo results for input(s): TROPONINI in the last 168 hours. No results for input(s): TROPIPOC in the last 168 hours.  Chemistry Recent Labs  Lab 01/17/21 0511 01/18/21 0639 01/19/21 0338 01/20/21 0248 01/21/21 0333  NA 142   < > 138 139 138  K 3.8   < > 4.3 4.4 4.4  CL 101   < > 97* 96* 92*  CO2 26   < > 31 33* 38*  GLUCOSE 148*   < > 99 100* 86  BUN 20   < > 29* 26* 33*  CREATININE 1.19*   < > 1.21* 1.27* 1.39*  CALCIUM 9.3   < > 9.2 9.2 9.4  PROT 6.0*  --   --   --   --   ALBUMIN 3.1*   --   --   --   --   AST 26  --   --   --   --   ALT 25  --   --   --   --   ALKPHOS 81  --   --   --   --   BILITOT 1.3*  --   --   --   --   GFRNONAA 47*   < > 46* 44* 39*  ANIONGAP 15   < > 10 10 8    < > = values in this interval not displayed.    Hematology Recent Labs  Lab 01/19/21 0338 01/20/21 0248 01/21/21 0333  WBC 9.7 10.7* 9.7  RBC 3.88 4.04 4.05  HGB 11.5* 11.4* 11.7*  HCT 36.7 37.9 37.7  MCV 94.6 93.8 93.1  MCH 29.6 28.2 28.9  MCHC 31.3 30.1 31.0  RDW 14.4 14.3 14.2  PLT 258 274 290   BNP Recent Labs  Lab 01/16/21 1544  BNP 2,682.2*    DDimer No results for input(s): DDIMER in the last 168 hours.   Radiology  No results found.  Cardiac Studies   TTE 01/17/2021  1. Left ventricular ejection fraction, by estimation, is 10-15%. The left  ventricle has severely decreased function. The left ventricle demonstrates  global hypokinesis. The left ventricular internal cavity size was severely  dilated. Indeterminate  diastolic filling due to E-A fusion.   2. Right ventricular systolic function is severely reduced. The right  ventricular size is mildly enlarged. There is moderately elevated  pulmonary artery systolic pressure. The estimated right ventricular  systolic pressure is 45.7 mmHg.   3. Left atrial size was mildly dilated.   4. The mitral valve is grossly normal. Severe mitral valve regurgitation.  No evidence of mitral stenosis.   5. The aortic valve is tricuspid. There is mild thickening of the aortic  valve. Aortic valve regurgitation is not visualized. No aortic stenosis is  present.   6. The inferior vena cava is normal in size with <50% respiratory  variability, suggesting right atrial pressure of 8 mmHg.   Patient Profile  75 yo F with of CAD s/p CABG, HFrEF/ICM, tobacco abuse, COPD admitted 01/16/2021 for acute on  chronic systolic HF and COPD exacerbation.  Assessment & Plan   #Acute systolic heart failure, EF 10-15% #Severe functional  mitral regurgitation #Left bundle branch block, QRS 163 ms -Suspect this is a nonischemic cardiomyopathy.  Worsened by noncompliance.  She stopped taking medications.  History of CAD status post CABG.  Left heart cath in 2020 with patent vein grafts.  Small distal circumflex disease but this does not explain her cardiomyopathy. -Approaching euvolemia. -Net -2.6 L since admission.  Weights are down 72.8 kg to 64.3 kg.  Suspect I's and O's have been inaccurate. -Kidney function is stable. -Suspect he will need 1 more day of IV diuretic therapy.  Continue with 120 IV milligrams of Lasix twice daily today.  Likely transition tomorrow. -Continue digoxin 0.125 mg daily, Coreg 3.125 mg twice daily, hydralazine 25 mg 3 times daily, Imdur 30 mg daily. -Not a candidate for ACE/ARB/Arni/MRA given CKD. -She is on Jardiance 10 mg daily. -Suspect she could be evaluated for BiV ICD as I think this would help given her left bundle branch block and QRS of 166 ms.  This will be deferred to the outpatient provider.  #Atrial tachycardia -Started on amiodarone due to atrial tachycardia episodes.  Beta-blocker as above.  We will continue this for now.  No documented A. fib.  #COPD exacerbation -Per teaching service.  For questions or updates, please contact [facility] Please consult [email or web address] for contact info under   Time Spent with Patient: I have spent a total of 35 minutes with patient reviewing hospital notes, telemetry, EKGs, labs and examining the patient as well as establishing an assessment and plan that was discussed with the patient.  > 50% of time was spent in direct patient care.    Signed, Lenna Gilford. Flora Lipps, MD, [facility]  [facility]  01/21/2021 11:47 AM

## 2021-01-22 ENCOUNTER — Other Ambulatory Visit (HOSPITAL_COMMUNITY): Payer: Self-pay

## 2021-01-22 DIAGNOSIS — I214 Non-ST elevation (NSTEMI) myocardial infarction: Secondary | ICD-10-CM

## 2021-01-22 DIAGNOSIS — I5021 Acute systolic (congestive) heart failure: Secondary | ICD-10-CM | POA: Diagnosis not present

## 2021-01-22 LAB — BASIC METABOLIC PANEL
Anion gap: 11 (ref 5–15)
BUN: 43 mg/dL — ABNORMAL HIGH (ref 8–23)
CO2: 33 mmol/L — ABNORMAL HIGH (ref 22–32)
Calcium: 9.1 mg/dL (ref 8.9–10.3)
Chloride: 92 mmol/L — ABNORMAL LOW (ref 98–111)
Creatinine, Ser: 1.56 mg/dL — ABNORMAL HIGH (ref 0.44–1.00)
GFR, Estimated: 34 mL/min — ABNORMAL LOW (ref >60)
Glucose, Bld: 90 mg/dL (ref 70–99)
Potassium: 4.3 mmol/L (ref 3.5–5.1)
Sodium: 136 mmol/L (ref 135–145)

## 2021-01-22 LAB — CBC
HCT: 40 % (ref 36.0–46.0)
Hemoglobin: 12.8 g/dL (ref 12.0–15.0)
MCH: 29.6 pg (ref 26.0–34.0)
MCHC: 32 g/dL (ref 30.0–36.0)
MCV: 92.4 fL (ref 80.0–100.0)
Platelets: 293 10*3/uL (ref 150–400)
RBC: 4.33 MIL/uL (ref 3.87–5.11)
RDW: 14.1 % (ref 11.5–15.5)
WBC: 11.3 10*3/uL — ABNORMAL HIGH (ref 4.0–10.5)
nRBC: 0 % (ref 0.0–0.2)

## 2021-01-22 LAB — MAGNESIUM: Magnesium: 2.4 mg/dL (ref 1.7–2.4)

## 2021-01-22 MED ORDER — ACETAMINOPHEN 325 MG PO TABS: 650.0000 mg | ORAL_TABLET | ORAL | Status: AC | PRN

## 2021-01-22 MED ORDER — PREDNISONE 20 MG PO TABS
20.0000 mg | ORAL_TABLET | Freq: Every day | ORAL | 0 refills | Status: AC
  Filled 2021-01-22: qty 2, 2d supply, fill #0

## 2021-01-22 MED ORDER — TORSEMIDE 20 MG PO TABS
20.0000 mg | ORAL_TABLET | Freq: Every day | ORAL | 2 refills | Status: DC
  Filled 2021-01-22: qty 30, 30d supply, fill #0

## 2021-01-22 MED ORDER — CARVEDILOL 3.125 MG PO TABS
3.1250 mg | ORAL_TABLET | Freq: Two times a day (BID) | ORAL | 5 refills | Status: DC
  Filled 2021-01-22: qty 60, 30d supply, fill #0

## 2021-01-22 MED ORDER — PREDNISONE 20 MG PO TABS
20.0000 mg | ORAL_TABLET | Freq: Every day | ORAL | Status: DC
  Administered 2021-01-22: 20 mg via ORAL
  Filled 2021-01-22: qty 1

## 2021-01-22 MED ORDER — AMIODARONE HCL 200 MG PO TABS
ORAL_TABLET | ORAL | 1 refills | Status: DC
  Filled 2021-01-22: qty 38, 30d supply, fill #0

## 2021-01-22 MED ORDER — EMPAGLIFLOZIN 10 MG PO TABS
10.0000 mg | ORAL_TABLET | Freq: Every day | ORAL | 2 refills | Status: DC
  Filled 2021-01-22: qty 14, 14d supply, fill #0

## 2021-01-22 MED ORDER — ISOSORBIDE MONONITRATE ER 30 MG PO TB24
30.0000 mg | ORAL_TABLET | Freq: Every day | ORAL | 2 refills | Status: DC
  Filled 2021-01-22: qty 30, 30d supply, fill #0

## 2021-01-22 MED ORDER — TORSEMIDE 20 MG PO TABS
20.0000 mg | ORAL_TABLET | Freq: Every day | ORAL | Status: DC
  Administered 2021-01-22: 20 mg via ORAL
  Filled 2021-01-22: qty 1

## 2021-01-22 MED ORDER — HYDRALAZINE HCL 25 MG PO TABS
25.0000 mg | ORAL_TABLET | Freq: Three times a day (TID) | ORAL | 2 refills | Status: DC
  Filled 2021-01-22: qty 90, 30d supply, fill #0

## 2021-01-22 MED ORDER — DIGOXIN 125 MCG PO TABS
0.1250 mg | ORAL_TABLET | Freq: Every day | ORAL | 2 refills | Status: DC
  Filled 2021-01-22: qty 30, 30d supply, fill #0

## 2021-01-22 MED ORDER — ALBUTEROL SULFATE HFA 108 (90 BASE) MCG/ACT IN AERS
2.0000 | INHALATION_SPRAY | Freq: Four times a day (QID) | RESPIRATORY_TRACT | 5 refills | Status: DC | PRN
  Filled 2021-01-22: qty 8.5, 16d supply, fill #0

## 2021-01-22 MED ORDER — UMECLIDINIUM-VILANTEROL 62.5-25 MCG/INH IN AEPB
1.0000 | INHALATION_SPRAY | Freq: Every day | RESPIRATORY_TRACT | 2 refills | Status: DC
  Filled 2021-01-22: qty 60, 30d supply, fill #0

## 2021-01-22 NOTE — Care Management (Addendum)
Ordered oxygen with [employer], they cannot start to process order until ambulation oxygen saturation note is entered. NCM has asked nurse for note.  1556 Patient does not qualify for home oxygen , secure chatted MD

## 2021-01-22 NOTE — Progress Notes (Signed)
Cardiology Progress Note  Patient ID: Dana [name] MRN: [id number] DOB: [birth year] Date of Encounter: 01/22/2021  Primary Cardiologist: Julien Nordmann, MD  Subjective   Chief Complaint: None.   HPI: Good diuresis. HR stable. Weights down.   ROS:  All other ROS reviewed and negative. Pertinent positives noted in the HPI.     Inpatient Medications  Scheduled Meds:  amiodarone  200 mg Oral BID   Followed by   Melene Muller ON 01/25/2021] amiodarone  200 mg Oral Daily   aspirin  81 mg Oral Daily   atorvastatin  80 mg Oral q1800   carvedilol  3.125 mg Oral BID WC   digoxin  0.125 mg Oral Daily   empagliflozin  10 mg Oral Daily   enoxaparin (LOVENOX) injection  30 mg Subcutaneous QHS   hydrALAZINE  25 mg Oral Q8H   isosorbide mononitrate  30 mg Oral Daily   melatonin  3 mg Oral QHS   predniSONE  20 mg Oral Q breakfast   sodium chloride flush  3 mL Intravenous Q12H   torsemide  20 mg Oral Daily   umeclidinium-vilanterol  1 puff Inhalation Daily   Continuous Infusions:  sodium chloride     PRN Meds: sodium chloride, acetaminophen, ipratropium-albuterol, ondansetron (ZOFRAN) IV, sodium chloride flush   Vital Signs   Vitals:   01/21/21 1644 01/21/21 2253 01/22/21 0453 01/22/21 0918  BP: (!) 119/50 (!) 113/43 (!) 108/56   Pulse: 68 63 61 66  Resp: 18 19 18 16   Temp: 98.2 F (36.8 C) 98.6 F (37 C) 98.8 F (37.1 C)   TempSrc: Oral Oral Oral   SpO2: 98% 97% 99% 97%  Weight:   63 kg   Height:        Intake/Output Summary (Last 24 hours) at 01/22/2021 1115 Last data filed at 01/22/2021 0456 Gross per 24 hour  Intake 740 ml  Output 401 ml  Net 339 ml   Last 3 Weights 01/22/2021 01/21/2021 01/20/2021  Weight (lbs) 139 lb 141 lb 11.2 oz 149 lb 8 oz  Weight (kg) 63.05 kg 64.275 kg 67.813 kg      Telemetry  Overnight telemetry shows SR 70s, which I personally reviewed.   Physical Exam   Vitals:   01/21/21 1644 01/21/21 2253 01/22/21 0453 01/22/21 0918  BP: (!) 119/50  (!) 113/43 (!) 108/56   Pulse: 68 63 61 66  Resp: 18 19 18 16   Temp: 98.2 F (36.8 C) 98.6 F (37 C) 98.8 F (37.1 C)   TempSrc: Oral Oral Oral   SpO2: 98% 97% 99% 97%  Weight:   63 kg   Height:        Intake/Output Summary (Last 24 hours) at 01/22/2021 1115 Last data filed at 01/22/2021 0456 Gross per 24 hour  Intake 740 ml  Output 401 ml  Net 339 ml    Last 3 Weights 01/22/2021 01/21/2021 01/20/2021  Weight (lbs) 139 lb 141 lb 11.2 oz 149 lb 8 oz  Weight (kg) 63.05 kg 64.275 kg 67.813 kg    Body mass index is 26.26 kg/m.  General: Well nourished, well developed, in no acute distress Head: Atraumatic, normal size  Eyes: PEERLA, EOMI  Neck: Supple, JVD 5 to 7 cm water Endocrine: No thryomegaly Cardiac: Normal S1, S2; RRR; no murmurs, rubs, or gallops Lungs: Rhonchi bilaterally Abd: Soft, nontender, no hepatomegaly  Ext: No edema, pulses 2+ Musculoskeletal: No deformities, BUE and BLE strength normal and equal Skin: Warm and dry, diffuse psoriasis noted Neuro:  Alert and oriented to person, place, time, and situation, CNII-XII grossly intact, no focal deficits  Psych: Normal mood and affect   Labs  High Sensitivity Troponin:   Recent Labs  Lab 01/16/21 1504 01/17/21 0511  TROPONINIHS 34* 27*     Cardiac EnzymesNo results for input(s): TROPONINI in the last 168 hours. No results for input(s): TROPIPOC in the last 168 hours.  Chemistry Recent Labs  Lab 01/17/21 0511 01/18/21 0639 01/20/21 0248 01/21/21 0333 01/22/21 0342  NA 142   < > 139 138 136  K 3.8   < > 4.4 4.4 4.3  CL 101   < > 96* 92* 92*  CO2 26   < > 33* 38* 33*  GLUCOSE 148*   < > 100* 86 90  BUN 20   < > 26* 33* 43*  CREATININE 1.19*   < > 1.27* 1.39* 1.56*  CALCIUM 9.3   < > 9.2 9.4 9.1  PROT 6.0*  --   --   --   --   ALBUMIN 3.1*  --   --   --   --   AST 26  --   --   --   --   ALT 25  --   --   --   --   ALKPHOS 81  --   --   --   --   BILITOT 1.3*  --   --   --   --   GFRNONAA 47*   < > 44*  39* 34*  ANIONGAP 15   < > 10 8 11    < > = values in this interval not displayed.    Hematology Recent Labs  Lab 01/20/21 0248 01/21/21 0333 01/22/21 0342  WBC 10.7* 9.7 11.3*  RBC 4.04 4.05 4.33  HGB 11.4* 11.7* 12.8  HCT 37.9 37.7 40.0  MCV 93.8 93.1 92.4  MCH 28.2 28.9 29.6  MCHC 30.1 31.0 32.0  RDW 14.3 14.2 14.1  PLT 274 290 293   BNP Recent Labs  Lab 01/16/21 1544  BNP 2,682.2*    DDimer No results for input(s): DDIMER in the last 168 hours.   Radiology  No results found.  Cardiac Studies  TTE 01/17/2021  1. Left ventricular ejection fraction, by estimation, is 10-15%. The left  ventricle has severely decreased function. The left ventricle demonstrates  global hypokinesis. The left ventricular internal cavity size was severely  dilated. Indeterminate  diastolic filling due to E-A fusion.   2. Right ventricular systolic function is severely reduced. The right  ventricular size is mildly enlarged. There is moderately elevated  pulmonary artery systolic pressure. The estimated right ventricular  systolic pressure is 45.7 mmHg.   3. Left atrial size was mildly dilated.   4. The mitral valve is grossly normal. Severe mitral valve regurgitation.  No evidence of mitral stenosis.   5. The aortic valve is tricuspid. There is mild thickening of the aortic  valve. Aortic valve regurgitation is not visualized. No aortic stenosis is  present.   6. The inferior vena cava is normal in size with <50% respiratory  variability, suggesting right atrial pressure of 8 mmHg.   Patient Profile  75 yo F with of CAD s/p CABG, HFrEF/ICM, tobacco abuse, COPD admitted 01/16/2021 for acute on chronic systolic HF and COPD exacerbation.  Assessment & Plan   #Acute systolic heart failure, EF 10-15% #Severe functional mitral regurgitation #Left bundle branch block, QRS 163 ms -Admitted with acute on chronic systolic  heart failure.  Had not been taking medications.  Suspect this is a  nonischemic or myopathy in the setting of left ventricular dyssynchrony in the setting of left bundle branch block.  Cardiac cath in 2010 with patent grafts.  She has small distal circumflex disease.  Cardiomyopathy is out of proportion to CAD. -Not a great candidate for advanced care.  She has been noncompliant with medications.  Age precludes transplant.  Not a good candidate for LVAD given CKD.  Nonetheless she does appear to be stable.  Symptoms are improving on medical therapy.  That is my recommendation. -She appears euvolemic to me.  I's and O showed -1.5 L however her weight is down nearly 10 kg.  She appears euvolemic to me.  Transition to torsemide 20 mg daily.  Would recommend she be discharged on this. -Not a candidate for ACE/ARB/Arni/MRA given CKD stage III.  Would recommend she go home on Coreg 3.125 mg twice daily, digoxin 0.125 mg daily, hydralazine 25 mg 3 times daily, Imdur 30 mg daily.  She is on Jardiance 10 mg daily.  Torsemide 20 mg daily at discharge. -She could be considered for BiV ICD as an outpatient.  This does not need to be done while in-house.  #Atrial tachycardia -Was having frequent rapid A. tach.  We will continue amiodarone load as an outpatient.  And then 200 mg daily.  She is on a beta-blocker.  No documented A. fib. -The need for amiodarone can be reassessed as an outpatient.  For now would continue.  #COPD -per teaching service  [facility] will sign off.   Medication Recommendations: Medications as above. Other recommendations (labs, testing, etc): Consideration for BiV ICD as an outpatient. Follow up as an outpatient: We will arrange hospital follow-up in [place] in 1 to 2 weeks.  For questions or updates, please contact [facility] Please consult [email or web address] for contact info under   Time Spent with Patient: I have spent a total of 25 minutes with patient reviewing hospital notes, telemetry, EKGs, labs and examining the patient as well as  establishing an assessment and plan that was discussed with the patient.  > 50% of time was spent in direct patient care.    Signed, Lenna Gilford. Flora Lipps, MD, [facility]  [facility]  01/22/2021 11:15 AM

## 2021-01-22 NOTE — Progress Notes (Signed)
Patient's o2 sat is 96% in room air when ambulating.

## 2021-01-22 NOTE — Discharge Summary (Addendum)
Name: Dana [name] MRN: [id number] DOB: Feb 25, 1946 75 y.o. PCP: Patient, No Pcp Per (Inactive)  Date of Admission: 01/16/2021  3:06 PM Date of Discharge:   01/22/2021 Attending Physician: Dr. Criselda Peaches  DISCHARGE DIAGNOSIS:  Primary Problem: Heart failure [facility])   Hospital Problems: Principal Problem:   Heart failure (HCC) Active Problems:   COPD exacerbation (HCC)    DISCHARGE MEDICATIONS:   Allergies as of 01/22/2021   No Known Allergies      Medication List     STOP taking these medications    feeding supplement Liqd   furosemide 40 MG tablet Commonly known as: LASIX   sacubitril-valsartan 24-26 MG Commonly known as: ENTRESTO   spironolactone 25 MG tablet Commonly known as: ALDACTONE       TAKE these medications    acetaminophen 325 MG tablet Commonly known as: TYLENOL Take 2 tablets (650 mg total) by mouth every 4 (four) hours as needed for headache or mild pain.   albuterol 108 (90 Base) MCG/ACT inhaler Commonly known as: VENTOLIN HFA Inhale 2 puffs into the lungs every 6 (six) hours as needed for wheezing or shortness of breath.   amiodarone 200 MG tablet Commonly known as: PACERONE Take 1 tablet two times per day until 9/26, then on 9/26 start taking 1 tablet only once a day.   aspirin 81 MG chewable tablet Chew 1 tablet (81 mg total) by mouth daily.   atorvastatin 80 MG tablet Commonly known as: LIPITOR Take 1 tablet (80 mg total) by mouth daily at 6 PM.   carvedilol 3.125 MG tablet Commonly known as: COREG Take 1 tablet (3.125 mg total) by mouth 2 (two) times daily with a meal.   digoxin 0.125 MG tablet Commonly known as: LANOXIN Take 1 tablet (0.125 mg total) by mouth daily. Start taking on: January 23, 2021   empagliflozin 10 MG Tabs tablet Commonly known as: JARDIANCE Take 1 tablet (10 mg total) by mouth daily. Start taking on: January 23, 2021   hydrALAZINE 25 MG tablet Commonly known as: APRESOLINE Take 1 tablet (25 mg  total) by mouth every 8 (eight) hours.   isosorbide mononitrate 30 MG 24 hr tablet Commonly known as: IMDUR Take 1 tablet (30 mg total) by mouth daily. Start taking on: January 23, 2021   predniSONE 20 MG tablet Commonly known as: DELTASONE Take 1 tablet (20 mg total) by mouth daily with breakfast for 2 days. Start taking on: January 23, 2021   torsemide 20 MG tablet Commonly known as: DEMADEX Take 1 tablet (20 mg total) by mouth daily. Start taking on: January 23, 2021   umeclidinium-vilanterol 62.5-25 MCG/INH Aepb Commonly known as: ANORO ELLIPTA Inhale 1 puff into the lungs daily. Start taking on: January 23, 2021        DISPOSITION AND FOLLOW-UP:  [name] was discharged from [facility] in Stable condition. At the hospital follow up visit please address:  CHF Exacerbation Ensure patient is taking Coreg 3.125 mg twice daily, digoxin 0.125 mg daily, hydralazine 25 mg 3 times daily, Imdur 30 mg daily.  Follow-up with cardiology.  COPD exacerbation Ensure patient has Anoro Ellipta.  #CKD Stage III Follow-up BMP.  Follow-up Recommendations: Consults: Cardiology, Internal Medicine Labs: Basic Metabolic Profile and Kidney Profile Studies: None Medications: Coreg 3.125 mg twice daily, digoxin 0.125 mg daily, hydralazine 25 mg 3 times daily, Imdur 30 mg daily, and oral Ellipta inhaler.  Follow-up Appointments:  Follow-up Information     [facility]  [place]. Go to.   Why: April 02, 2021 at 1:20 pm Contact information: [address], [place] , [place] [address]   Phone [phone]        [place], [place] D, [place] Follow up on 02/05/2021.   Specialties: Physician Assistant, Cardiology Why: at 11:30 am for your cardiology follow up appointment Contact information: [address] [place] [address] [phone]         Antonieta Iba, MD .   Specialty: Cardiology Contact information: [address] [place] [address] [phone]         [facility]. Schedule an appointment as soon as possible for a visit in 1 week(s).   Contact information: [address] [place] [address] [phone]                HOSPITAL COURSE:  Patient Summary: Acute on chronic HFrEF exacerbation Patient presented to the ED complaining of progressively worsening shortness of breath and bilateral lower extremity edema with orthopnea.  She was initiated on Lasix 40 mg IV twice daily.  At this time she was also noted to have a mildly increased creatinine from her baseline, at 1.19.  Of note her troponins were mildly elevated to 34 on presentation, however this decreased to 27 on recheck and attributed to demand ischemia.  Echocardiogram on 9/18 showed LV EF of 10 to 15%, severely decreased function, global hypokinesis, severe dilation; RV systolic function severely decreased, mild enlargement of RV, moderately increased pulmonary artery systolic pressure; LA mildly dilated, severe mitral valve regurgitation.  Due to patient hypotension and increasing creatinine, Lasix was held.  The heart failure team was consulted at this time and she was initiated on digoxin 0.125 mg daily for blood pressure support, hydralazine 25 mg 3 times daily for afterload reduction, Lasix 80 mg IV twice daily for volume overload. Due to atrial tachycardia episodes noted on telemetry, she was initiated on amiodarone 200 mg twice daily. She also received Jardiance 10 mg daily. To increase diuresis, Lasix was increased to 120 mg IV twice daily 09/20 with improvement in urine output. She was continued on IV diuresis through 09/22 with transition to oral diuresis on day of discharge. She is overall down around 10 kg this admission and net -2,273.5 mL.   COPD exacerbation Patient noted to have diffuse expiratory wheezing bilaterally on exam.  Antibiotics were deferred as she did not  have cough or increased sputum production.  She initiated on DuoNebs every 4 hours, and prednisone 40 mg daily for 5 days.  Chest x-ray 9/18 did not show evidence of underlying infection. Due to persistent diffuse wheezing in bilateral lung fields, the patient was started on Anora Ellipta with daily improvement in wheezing. On day of discharge there was no wheezing on auscultation and patient reported continuing to feel asymptomatic.  #CKD Stage III Patient's renal function remained relatively stable throughout admission with Bun/Cr 43/1.56 on day of discharge.  #CAD s/p  CABG in 2013 Patient was continued on aspirin 81 mg and home atorvastatin was increased to 80 mg daily.   DISCHARGE INSTRUCTIONS:   Discharge Instructions     (HEART FAILURE PATIENTS) Call MD:  Anytime you have any of the following symptoms: 1) 3 pound weight gain in 24 hours or 5 pounds in 1 week 2) shortness of breath, with or without a dry hacking cough 3) swelling in the hands, feet or stomach 4) if you have  to sleep on extra pillows at night in order to breathe.   Complete by: As directed    Diet - low sodium heart healthy   Complete by: As directed    Increase activity slowly   Complete by: As directed        SUBJECTIVE:  Patient evaluated at bedside on day of discharge.  She reports feeling well with no symptoms at rest or when walking around her room.  She is eager to go home.  Discharge Vitals:   BP 105/60 (BP Location: Left Arm)   Pulse 63   Temp 97.6 F (36.4 C) (Oral)   Resp 18   Ht 5\' 1"  (1.549 m)   Wt 63 kg   SpO2 94%   BMI 26.26 kg/m   OBJECTIVE:  Constitutional: Chronically ill-appearing woman resting in bed.  No acute distress noted. Cardio: Regular rate and rhythm.  No murmurs, rubs, or gallops. Pulm: Clear to auscultation bilaterally.  Normal effort of breathing.  Patient is on 2 L nasal cannula. Abdomen: Soft, nontender, nondistended. MSK: 1+ pitting edema to bilateral lower  extremities. Skin: Skin is warm and dry. Neuro: Alert and oriented x3.  No focal deficit noted. Psych: Normal mood and affect.  Pertinent Labs, Studies, and Procedures:  CBC Latest Ref Rng & Units 01/22/2021 01/21/2021 01/20/2021  WBC 4.0 - 10.5 K/uL 11.3(H) 9.7 10.7(H)  Hemoglobin 12.0 - 15.0 g/dL 01/22/2021 11.7(L) 11.4(L)  Hematocrit 36.0 - 46.0 % 40.0 37.7 37.9  Platelets 150 - 400 K/uL 293 290 274    CMP Latest Ref Rng & Units 01/22/2021 01/21/2021 01/20/2021  Glucose 70 - 99 mg/dL 90 86 01/22/2021)  BUN 8 - 23 mg/dL 196(Q) 22(L) 79(G)  Creatinine 0.44 - 1.00 mg/dL 92(J) 1.94(R) 7.40(C)  Sodium 135 - 145 mmol/L 136 138 139  Potassium 3.5 - 5.1 mmol/L 4.3 4.4 4.4  Chloride 98 - 111 mmol/L 92(L) 92(L) 96(L)  CO2 22 - 32 mmol/L 33(H) 38(H) 33(H)  Calcium 8.9 - 10.3 mg/dL 9.1 9.4 9.2  Total Protein 6.5 - 8.1 g/dL - - -  Total Bilirubin 0.3 - 1.2 mg/dL - - -  Alkaline Phos 38 - 126 U/L - - -  AST 15 - 41 U/L - - -  ALT 0 - 44 U/L - - -    DG Chest 2 View  Result Date: 01/16/2021 CLINICAL DATA:  Shortness of breath. EXAM: CHEST - 2 VIEW COMPARISON:  CT chest 07/25/2018. FINDINGS: The heart is enlarged, unchanged. Patient is status post cardiac surgery. Wires are seen in the left chest, unchanged. There is no focal lung infiltrate, pleural effusion or pneumothorax. Chronic appearing interstitial opacities are seen in the lung bases. No acute fractures are identified. IMPRESSION: 1. Stable cardiomegaly. 2. No evidence for pneumonia or edema. Electronically Signed   By: 07/27/2018 M.D.   On: 01/16/2021 15:49   ECHOCARDIOGRAM COMPLETE  Result Date: 01/17/2021    ECHOCARDIOGRAM REPORT   Patient Name:   TAMAKA SAWIN Date of Exam: 01/17/2021 Medical Rec #:  01/19/2021         Height:       61.0 in Accession #:    [id number]        Weight:       145.0 lb Date of Birth:  09-18-1945          BSA:          1.647 m Patient Age:    42 years  BP:           121/79 mmHg Patient Gender: F                  HR:           62 bpm. Exam Location:  Inpatient Procedure: 2D Echo, Cardiac Doppler, Color Doppler and Intracardiac            Opacification Agent Indications:    Dyspnea R06.00  History:        Patient has prior history of Echocardiogram examinations. CHF,                 CAD, Arrythmias:LBBB; Risk Factors:Dyslipidemia.  Sonographer:    Roosvelt Maser RDCS Referring Phys: [id number] SAMUEL A GRAY IMPRESSIONS  1. Left ventricular ejection fraction, by estimation, is 10-15%. The left ventricle has severely decreased function. The left ventricle demonstrates global hypokinesis. The left ventricular internal cavity size was severely dilated. Indeterminate diastolic filling due to E-A fusion.  2. Right ventricular systolic function is severely reduced. The right ventricular size is mildly enlarged. There is moderately elevated pulmonary artery systolic pressure. The estimated right ventricular systolic pressure is 45.7 mmHg.  3. Left atrial size was mildly dilated.  4. The mitral valve is grossly normal. Severe mitral valve regurgitation. No evidence of mitral stenosis.  5. The aortic valve is tricuspid. There is mild thickening of the aortic valve. Aortic valve regurgitation is not visualized. No aortic stenosis is present.  6. The inferior vena cava is normal in size with <50% respiratory variability, suggesting right atrial pressure of 8 mmHg. Comparison(s): Changes from prior study are noted. EF unchanged. RVSP elevated compared to prior. Severe mitral regurgitation is present that is secondary to cardiomyopathy. Conclusion(s)/Recommendation(s): No left ventricular mural or apical thrombus/thrombi. FINDINGS  Left Ventricle: Left ventricular ejection fraction, by estimation, is 10-15%. The left ventricle has severely decreased function. The left ventricle demonstrates global hypokinesis. Definity contrast agent was given IV to delineate the left ventricular endocardial borders. The left ventricular internal cavity size  was severely dilated. There is no left ventricular hypertrophy. Indeterminate diastolic filling due to E-A fusion. Right Ventricle: The right ventricular size is mildly enlarged. No increase in right ventricular wall thickness. Right ventricular systolic function is severely reduced. There is moderately elevated pulmonary artery systolic pressure. The tricuspid regurgitant velocity is 3.07 m/s, and with an assumed right atrial pressure of 8 mmHg, the estimated right ventricular systolic pressure is 45.7 mmHg. Left Atrium: Left atrial size was mildly dilated. Right Atrium: Right atrial size was normal in size. Pericardium: Trivial pericardial effusion is present. Mitral Valve: The mitral valve is grossly normal. Severe mitral valve regurgitation. No evidence of mitral valve stenosis. Tricuspid Valve: The tricuspid valve is grossly normal. Tricuspid valve regurgitation is mild . No evidence of tricuspid stenosis. Aortic Valve: The aortic valve is tricuspid. There is mild thickening of the aortic valve. Aortic valve regurgitation is not visualized. No aortic stenosis is present. Aortic valve mean gradient measures 5.0 mmHg. Aortic valve peak gradient measures 8.9 mmHg. Aortic valve area, by VTI measures 0.94 cm. Pulmonic Valve: The pulmonic valve was grossly normal. Pulmonic valve regurgitation is not visualized. No evidence of pulmonic stenosis. Aorta: The aortic root is normal in size and structure. Venous: The inferior vena cava is normal in size with less than 50% respiratory variability, suggesting right atrial pressure of 8 mmHg. IAS/Shunts: The atrial septum is grossly normal. Additional Comments: A device lead is visualized in the right  atrium and right ventricle.  LEFT VENTRICLE PLAX 2D LVIDd:         6.80 cm  Diastology LVIDs:         6.40 cm  LV e' medial:    2.83 cm/s LV PW:         0.90 cm  LV E/e' medial:  38.2 LV IVS:        1.00 cm  LV e' lateral:   9.70 cm/s LVOT diam:     1.90 cm  LV E/e' lateral:  11.1 LV SV:         23 LV SV Index:   14 LVOT Area:     2.84 cm  RIGHT VENTRICLE            IVC RV Basal diam:  4.30 cm    IVC diam: 2.00 cm RV Mid diam:    3.00 cm RV S prime:     7.62 cm/s TAPSE (M-mode): 1.3 cm LEFT ATRIUM             Index       RIGHT ATRIUM           Index LA diam:        4.10 cm 2.49 cm/m  RA Area:     20.80 cm LA Vol (A2C):   49.2 ml 29.86 ml/m RA Volume:   66.30 ml  40.24 ml/m LA Vol (A4C):   43.4 ml 26.34 ml/m LA Biplane Vol: 46.4 ml 28.16 ml/m  AORTIC VALVE AV Area (Vmax):    0.95 cm AV Area (Vmean):   0.88 cm AV Area (VTI):     0.94 cm AV Vmax:           149.00 cm/s AV Vmean:          101.000 cm/s AV VTI:            0.243 m AV Peak Grad:      8.9 mmHg AV Mean Grad:      5.0 mmHg LVOT Vmax:         49.80 cm/s LVOT Vmean:        31.400 cm/s LVOT VTI:          0.080 m LVOT/AV VTI ratio: 0.33  AORTA Ao Root diam: 3.00 cm MITRAL VALVE                 TRICUSPID VALVE MV Area (PHT): 4.80 cm      TR Peak grad:   37.7 mmHg MV Decel Time: 158 msec      TR Vmax:        307.00 cm/s MR Peak grad:    67.2 mmHg MR Mean grad:    42.0 mmHg   SHUNTS MR Vmax:         410.00 cm/s Systemic VTI:  0.08 m MR Vmean:        305.0 cm/s  Systemic Diam: 1.90 cm MR PISA:         1.57 cm MR PISA Eff ROA: 12 mm MR PISA Radius:  0.50 cm MV E velocity: 108.00 cm/s MV A velocity: 66.60 cm/s MV E/A ratio:  1.62 Lennie Odor MD Electronically signed by Lennie Odor MD Signature Date/Time: 01/17/2021/7:41:04 PM    Final      Signed: Champ Mungo, D.O.  Internal Medicine Resident, PGY-1 [facility]  Pager: [phone] 2:34 PM, 01/22/2021

## 2021-01-22 NOTE — TOC Benefit Eligibility Note (Addendum)
Patient Product/process development scientist completed.    The patient is currently admitted and upon discharge could be taking Trelegy.  The current 30 day co-pay is, $482.00 due to a $435.00 deductible remaining.   The patient is currently admitted and upon discharge could be taking Anora Ellipta.  The current 30 day co-pay is, $479.02 due to a $435.00 deductible remaining.   The patient is currently admitted and upon discharge could be taking Breo Ellipta.  The current 30 day co-pay is, $410.94 due to a $435.00 deductible remaining.   The patient is currently admitted and upon discharge could be taking Spiriva  Requires Prior Authorization  The patient is currently admitted and upon discharge could be taking Incruse Ellipta.  The current 30 day co-pay is, $379.18 due to a $435.00 deductible remaining.  The patient is currently admitted and upon discharge could be taking Serevent.  The current 30 day co-pay is, $453.25 due to a $435.00 deductible remaining.  The patient is insured through [employer]     Roland Earl, CPhT Pharmacy Patient Advocate Specialist [facility] Antimicrobial Stewardship Team Direct Number: [phone]  Fax: [phone]

## 2021-01-22 NOTE — Plan of Care (Signed)
Follow up with cardiology [place] office arranged on 1130AM on 02/05/21 , see AVS

## 2021-01-28 ENCOUNTER — Encounter: Payer: Self-pay | Admitting: Internal Medicine

## 2021-01-28 ENCOUNTER — Other Ambulatory Visit: Payer: Self-pay

## 2021-01-28 ENCOUNTER — Ambulatory Visit (INDEPENDENT_AMBULATORY_CARE_PROVIDER_SITE_OTHER): Payer: Medicare Other | Admitting: Internal Medicine

## 2021-01-28 ENCOUNTER — Ambulatory Visit (INDEPENDENT_AMBULATORY_CARE_PROVIDER_SITE_OTHER): Payer: Medicare Other | Admitting: Pharmacist

## 2021-01-28 VITALS — BP 121/46 | HR 72 | Temp 96.0°F | Ht 60.0 in | Wt 145.0 lb

## 2021-01-28 DIAGNOSIS — J441 Chronic obstructive pulmonary disease with (acute) exacerbation: Secondary | ICD-10-CM

## 2021-01-28 DIAGNOSIS — I509 Heart failure, unspecified: Secondary | ICD-10-CM | POA: Diagnosis not present

## 2021-01-28 DIAGNOSIS — Z0000 Encounter for general adult medical examination without abnormal findings: Secondary | ICD-10-CM | POA: Diagnosis not present

## 2021-01-28 DIAGNOSIS — I5021 Acute systolic (congestive) heart failure: Secondary | ICD-10-CM

## 2021-01-28 DIAGNOSIS — Z23 Encounter for immunization: Secondary | ICD-10-CM

## 2021-01-28 DIAGNOSIS — L409 Psoriasis, unspecified: Secondary | ICD-10-CM | POA: Diagnosis not present

## 2021-01-28 NOTE — Patient Instructions (Signed)
Thank you, Dana [name] for allowing [place] to provide your care today. Today we discussed: Heart failure/recent hospitalization: Continue to take your meds as prescribed- amiodarone 200 mg daily, aspirin 81 mg daily, lipitor 80 mg daily, coreg 3.125 mg twice daily, digoxin 0.125 mg daily, jardiance 10 mg daily, hydralazine 25 mg three times daily, imdur 30 mg daily, and torsemide 20 mg daily. Follow up with the cardiologists on 10/7 as scheduled. Continue to limit salt intake, as discussed during our visit. I am glad you are feeling much better!  Psoriasis: if you would like a referral to a dermatologist in the future please let us know  I have ordered the following labs for you:   Lab Orders         BMP8+Anion Gap        Referrals ordered today:   Referral Orders  No referral(s) requested today     I have ordered the following medication/changed the following medications:   Stop the following medications: There are no discontinued medications.   Start the following medications: No orders of the defined types were placed in this encounter.    Follow up: 3 months    Remember: To go to the emergency room if your shortness of breath worsens, if you experience chest pain or palpitations, or if your leg swelling significantly worsens/weight increases.   Should you have any questions or concerns please call the internal medicine clinic at [phone].     Elza Rafter, D.O. [facility]

## 2021-01-28 NOTE — Assessment & Plan Note (Addendum)
Patient was recently admitted and discharged from the hospital for acute on chronic CHF exacerbation. She presented at the time with significant dyspnea on exertion, orthopnea, and an increase in lower extremity swelling likely secondary to being off of her CHF medications (entresto, furosemide) as she was unable to afford them. Echo performed during the admission and EF was 10-15%. Patient received IV diuresis during the course of her admission and had a significant improvement in her symptoms. Today the patient reports no chest pain, palpitations, shortness of breath, orthopnea, dyspnea on exertion, or any other sxs at this time. She notes that she does have some lower leg swelling, but this appears to be back to her baseline. Overall, she says she feels quite well compared to one week ago. The patient met with Dr. Georgina Peer (clinical pharmacist) today to discuss her medications and she feels like she has a good understanding of them. She has been adherent with her medications since discharge and has had no issues with them. Patient had questions specifically regarding low salt diet with her diagnosis of CHF, so this was discussed extensively. Recommended the patient limit her salt intake to <1-2 g/day.   Plan: - Follow up with cardiology as scheduled on 10/7 - Continue coreg 3.125 mg bid, digoxin 0.125 mg, jardiance 10 mg, hydralazine 25 mg TID, imdur 30 mg, torsemide 20 mg - Continue amiodarone 200 mg daily for atrial tachycardia - Follow up in 3 months (if she decides to establish care with [place]; has PCP in [place], [place]? but was unable to be seen until December for hospital follow up)

## 2021-01-28 NOTE — Assessment & Plan Note (Signed)
Flu shot administered today. 

## 2021-01-28 NOTE — Progress Notes (Signed)
   CC: hospital follow up  HPI:  [name] is a 75 y.o. female with HFrEF (EF 10-15%), CAD s/p CABG, and HLD who presents to the [facility] for a hospital follow up after being admitted for acute on chronic CHF exacerbation. Please see problem-based list for further details, assessments, and plans.  PMHx: HFrEF (EF 10-15%), psoriasis, CAD s/p CABG, HLD Meds: albuterol prn, amiodarone 200 mg, asa 81 mg, lipitor 80 mg, coreg 3.125 mg BID, digoxin 0.125 mg, jardiance 10 mg, hydralazine 25 mg TID, imdur 30 mg, torsemide 20 mg, anoro ellipta inhaler 1 puff daily Allergies: none Fam Hx: Extensive family history of cardiovascular disease, diabetes, and psoriasis Soc Hx: Lives alone in her trailer in [place], [place]. Has 4 children and her daughter checks up on her everyday. Able to perform her own ADLs. Previous history of tobacco use ~15-20 years of 1-2PPD. No alcohol or other drug use.   Past Medical History:  Diagnosis Date   CHF (congestive heart failure) (HCC)    Complication of anesthesia    "difficulty waking up, it was all day long"   Coronary artery disease    Hyperlipidemia    LBBB (left bundle branch block)    Psoriasis    Review of Systems:  Review of Systems  Constitutional:  Negative for chills and fever.  HENT:  Negative for congestion and sore throat.   Eyes: Negative.   Respiratory:  Negative for cough and shortness of breath.   Cardiovascular:  Positive for leg swelling. Negative for chest pain, palpitations and orthopnea.  Gastrointestinal:  Negative for diarrhea, nausea and vomiting.  Genitourinary: Negative.   Musculoskeletal: Negative.   Neurological:  Negative for dizziness and headaches.    Physical Exam:  Vitals:   01/28/21 1547  BP: (!) 121/46  Pulse: 72  Temp: (!) 96 F (35.6 C)  TempSrc: Oral  SpO2: 96%  Weight: 145 lb (65.8 kg)  Height: 5' (1.524 m)   General: No acute distress. Head: Normocephalic. Atraumatic. CV: RRR. No murmurs, rubs, or  gallops. 1+ pitting edema bilaterally. Pulmonary: Lungs CTAB. Normal effort. No wheezing or rales. Abdominal: Soft, nontender, nondistended. Normal bowel sounds. Extremities: Palpable radial and DP pulses. Normal ROM. Skin: Warm and dry. Diffuse psoriatic plaques over lower extremities, abdomen, and arms.  Neuro: A&Ox3. Moves all extremities. Normal sensation. No focal deficit. Psych: Normal mood and affect   Assessment & Plan:   See Encounters Tab for problem based charting.  Patient seen with Dr. Antony Contras

## 2021-01-28 NOTE — Assessment & Plan Note (Signed)
Patient has extensive family history and personal history of psoriasis (was diagnosed about 20 years ago). She has extensive psoriatic plaques covering her legs, abdomen, and arms, but has not received treatment for this. Discussed that we could refer the patient to dermatology if this becomes bothersome to her in the future.

## 2021-01-28 NOTE — Progress Notes (Signed)
   Subjective:    Patient ID: Dana [name], female    DOB: 03/21/1946, 75 y.o.   MRN: [id number]  HPI  Patient is a 75 y.o. female who presents for medication review and management. She is in good spirits and presents with assistance of wheelchair and daughter. Patient was referred on 01/16/21  Medication Adherence Questionnaire (A score of 2 or more points indicates risk for nonadherence)  Do you know what each of your medicines is for? 1 (1 point if no)  Do you ever have trouble remembering to take your medicine? 0 (2 points if yes)  Do you ever not take a medicine because you feel you do not need it?  0 (1 point if yes)  Do you think that any of your medicines is not helping you? 1; "not sure" (1 point if yes)  Do you have any physical problems such as vision loss that keep you from taking your medicines as prescribed?  2; cost (2 points if yes)  Do you think any of your medicine is causing a side effect? 0 (1 point if yes)  Do you know the names of ALL of your medicines? 1 (1 point if no)  Do you think that you need ALL of your medicines? 0 (1 point if no)  In the past 6 months, have you missed getting a refill or a new prescription filled on time? 1; previously but now taking medications as prescribed (1 point if yes)  How often do you miss taking a dose of medicine? 0 Never (0 points), 1 or 2 times a month (1 points), 1 time a week (2 points), 2 or more times a week (3 points).   TOTAL SCORE 6/14     Objective:   Labs:   Physical Exam Neurological:     Mental Status: She is alert and oriented to person, place, and time.    Review of Systems  Respiratory:  Negative for shortness of breath.   Cardiovascular:  Negative for chest pain.   Assessment/Plan:   Understanding of regimen: fair  Understanding of indications: fair  Potential of compliance: good  Patient has known adherence challenges based on score of 6 for questionnaire. Barriers include: lack of knowledge and  physical barrier of cost. Medication list reviewed and updated.  Patient was provided with a printed medication list. Patient has PCP office outside of our clinic and will not be able to help patient with assistance for inhaler unless she establishes care with our clinic. Will follow-up with patient on Monday via telephone to confirm if she is going to continue with PCP outside of [facility].  Written patient instructions provided.  This appointment required 45 minutes of direct patient care.  Thank you for involving pharmacy to assist in providing this patient's care.

## 2021-01-29 LAB — BMP8+ANION GAP
Anion Gap: 17 mmol/L (ref 10.0–18.0)
BUN/Creatinine Ratio: 16 (ref 12–28)
BUN: 18 mg/dL (ref 8–27)
CO2: 27 mmol/L (ref 20–29)
Calcium: 9.8 mg/dL (ref 8.7–10.3)
Chloride: 97 mmol/L (ref 96–106)
Creatinine, Ser: 1.1 mg/dL — ABNORMAL HIGH (ref 0.57–1.00)
Glucose: 99 mg/dL (ref 70–99)
Potassium: 4.6 mmol/L (ref 3.5–5.2)
Sodium: 141 mmol/L (ref 134–144)
eGFR: 52 mL/min/{1.73_m2} — ABNORMAL LOW (ref >59)

## 2021-02-04 ENCOUNTER — Other Ambulatory Visit (HOSPITAL_COMMUNITY): Payer: Self-pay

## 2021-02-04 MED ORDER — BEVESPI AEROSPHERE 9-4.8 MCG/ACT IN AERO: 2.0000 | INHALATION_SPRAY | Freq: Two times a day (BID) | RESPIRATORY_TRACT | 1 refills | Status: DC

## 2021-02-04 NOTE — Addendum Note (Signed)
Addended by: Merrilyn Puma on: 02/04/2021 04:33 PM   Modules accepted: Orders

## 2021-02-05 ENCOUNTER — Encounter: Payer: Self-pay | Admitting: Physician Assistant

## 2021-02-05 ENCOUNTER — Ambulatory Visit (INDEPENDENT_AMBULATORY_CARE_PROVIDER_SITE_OTHER): Payer: Medicare Other | Admitting: Physician Assistant

## 2021-02-05 ENCOUNTER — Telehealth: Payer: Self-pay | Admitting: Physician Assistant

## 2021-02-05 ENCOUNTER — Other Ambulatory Visit: Payer: Self-pay

## 2021-02-05 VITALS — BP 120/60 | HR 71 | Ht 60.0 in | Wt 145.0 lb

## 2021-02-05 DIAGNOSIS — N189 Chronic kidney disease, unspecified: Secondary | ICD-10-CM

## 2021-02-05 DIAGNOSIS — Z79899 Other long term (current) drug therapy: Secondary | ICD-10-CM | POA: Diagnosis not present

## 2021-02-05 DIAGNOSIS — Z8679 Personal history of other diseases of the circulatory system: Secondary | ICD-10-CM

## 2021-02-05 DIAGNOSIS — Z87891 Personal history of nicotine dependence: Secondary | ICD-10-CM

## 2021-02-05 DIAGNOSIS — I739 Peripheral vascular disease, unspecified: Secondary | ICD-10-CM

## 2021-02-05 DIAGNOSIS — I5022 Chronic systolic (congestive) heart failure: Secondary | ICD-10-CM | POA: Diagnosis not present

## 2021-02-05 DIAGNOSIS — I10 Essential (primary) hypertension: Secondary | ICD-10-CM

## 2021-02-05 DIAGNOSIS — I447 Left bundle-branch block, unspecified: Secondary | ICD-10-CM

## 2021-02-05 DIAGNOSIS — E785 Hyperlipidemia, unspecified: Secondary | ICD-10-CM

## 2021-02-05 DIAGNOSIS — I2510 Atherosclerotic heart disease of native coronary artery without angina pectoris: Secondary | ICD-10-CM | POA: Diagnosis not present

## 2021-02-05 DIAGNOSIS — I498 Other specified cardiac arrhythmias: Secondary | ICD-10-CM

## 2021-02-05 DIAGNOSIS — J449 Chronic obstructive pulmonary disease, unspecified: Secondary | ICD-10-CM

## 2021-02-05 DIAGNOSIS — Z951 Presence of aortocoronary bypass graft: Secondary | ICD-10-CM

## 2021-02-05 DIAGNOSIS — I25118 Atherosclerotic heart disease of native coronary artery with other forms of angina pectoris: Secondary | ICD-10-CM | POA: Diagnosis not present

## 2021-02-05 DIAGNOSIS — I255 Ischemic cardiomyopathy: Secondary | ICD-10-CM

## 2021-02-05 DIAGNOSIS — I471 Supraventricular tachycardia: Secondary | ICD-10-CM

## 2021-02-05 MED ORDER — EMPAGLIFLOZIN 10 MG PO TABS: 10.0000 mg | ORAL_TABLET | Freq: Every day | ORAL | 6 refills | Status: DC

## 2021-02-05 NOTE — Telephone Encounter (Signed)
Patient calling with home med list after ov today  Amiodarone 200 mg po q d  Carvedilol 3.125 mg po q d  Digoxin 0.125 mg po q d  Hydralazine 25 mg po q 8 hr  Isosorbide 30 mg po q d  Torsemide 20 mg po q d  ASA 81 mg po q d   Atorvastatin 80 mg po qhs ( out needs refill if going to advise continue  )  Prednisone 2 tablets ( already completed )

## 2021-02-05 NOTE — Patient Instructions (Signed)
Medication Instructions:  - No changes made today.  - Please call with your updated medication list   *If you need a refill on your cardiac medications before your next appointment, please call your pharmacy*   Lab Work: - Your physician recommends that you have lab work today:  Digoxin/ CMET/ TSH/ Free T4/ CBC/ Direct LDL  If you have labs (blood work) drawn today and your tests are completely normal, you will receive your results only by: [employer] (if you have MyChart) OR A paper copy in the mail If you have any lab test that is abnormal or we need to change your treatment, we will call you to review the results.   Testing/Procedures: - none ordered   Follow-Up: At [facility], you and your health needs are our priority.  As part of our continuing mission to provide you with exceptional heart care, we have created designated Provider Care Teams.  These Care Teams include your primary Cardiologist (physician) and Advanced Practice Providers (APPs -  Physician Assistants and Nurse Practitioners) who all work together to provide you with the care you need, when you need it.  We recommend signing up for the patient portal called "MyChart".  Sign up information is provided on this After Visit Summary.  MyChart is used to connect with patients for Virtual Visits (Telemedicine).  Patients are able to view lab/test results, encounter notes, upcoming appointments, etc.  Non-urgent messages can be sent to your provider as well.   To learn more about what you can do with MyChart, go to [email or web address].    Your next appointment:   1 month(s)  The format for your next appointment:   In Person  Provider:   You may see Julien Nordmann, MD or one of the following Advanced Practice Providers on your designated Care Team:   Nicolasa Ducking, NP Eula Listen, PA-C Marisue Ivan, PA-C Cadence Fransico Michael, [place]   Other Instructions N/a

## 2021-02-05 NOTE — Progress Notes (Signed)
Office Visit    Patient Name: Dana [name] Date of Encounter: 02/05/2021  PCP:  Patient, No Pcp Per (Inactive)   [place]  Cardiologist:  Ida Rogue, MD  Advanced Practice Provider:  No care team member to display Electrophysiologist:  None [id number]}   Chief Complaint    Chief Complaint  Patient presents with   Hospitalization [facility] follow up. Medications verbally reviewed with patient.     75 y.o. female  with a hx of CAD s/p 01/2012 bypass surgery, HFrEF / ICM with 2013 TTE showing EF 15 to 20 % and MR, and current smoker, who is being seen today for hospital follow-up.  Past Medical History    Past Medical History:  Diagnosis Date   CHF (congestive heart failure) ([place])    Complication of anesthesia    "difficulty waking up, it was all day long"   Coronary artery disease    Hyperlipidemia    LBBB (left bundle branch block)    Psoriasis    Past Surgical History:  Procedure Laterality Date   CARDIAC CATHETERIZATION  02/17/2012   CORONARY ARTERY BYPASS GRAFT  02/23/2012   Procedure: CORONARY ARTERY BYPASS GRAFTING (CABG);  Surgeon: Melrose Nakayama, MD;  Location: [place] [facility];  Service: Open Heart Surgery;  Laterality: N/A;  coronary artery bypass graft on pump times two utlizing left internal mammary artery and right greater saphenous vein harvested endoscopically, transesophageal echocardiogram    NO PAST SURGERIES     RIGHT/LEFT HEART CATH AND CORONARY/GRAFT ANGIOGRAPHY N/A 07/30/2018   Procedure: RIGHT/LEFT HEART CATH AND CORONARY/GRAFT ANGIOGRAPHY;  Surgeon: Wellington Hampshire, MD;  Location: [place] CV [facility];  Service: Cardiovascular;  Laterality: N/A;   TEE WITHOUT CARDIOVERSION  02/22/2012   Procedure: TRANSESOPHAGEAL ECHOCARDIOGRAM (TEE);  Surgeon: Jolaine Artist, MD;  Location: [facility] ENDOSCOPY;  Service: Cardiovascular;  Laterality: N/A;    Allergies  No Known Allergies  History of Present Illness     Dana [name] is a 75 y.o. female with PMH above.   She presented to [facility] in October 2013 with EF of 15-20% on TEE and cardiac catheterization showing severe proximal LAD and ostial diagonal disease & moderate proximal left circumflex disease.  She was transferred to [facility] in [place] and underwent coronary bypass grafting x2 on 02/23/2012 with epicardial pacemaker lead on the posterior lateral wall in the event she needed BiV pacer in the future.    She was then lost to follow-up.  She reportedly stopped taking her medication when she started to feel better, approximately 1 year following her CABG.  She continued to smoke.  Cath 07/2018 with patent grafts LIMA to LAD, VG to diagonal.  LAD received mostly antegrade flow in LIMA was not atretic.  RHC with mildly elevated filling pressures, PCWP 13 mmHg, PAH 38, CO 3.13, CI 1.81.  Left circumflex 80% asked.  PCI was possible but Dr. Fletcher Anon did not feel it would improve EF.  Echo EF 20%, LV severely only dilated, remainder under CV studies.  She presented to the ED 12/2020 with increased dyspnea and LEE, and she was subsequently admitted for exacerbation of COPD and acute systolic CHF and IV diuresed.  She had run out of her stopped several of her cardiac her medications.  She reported cost of meds at approximately $500 per month.  She reported stopping smoking 2 weeks prior.  Ambulation was limited by hip and knee pain.  She did not have a  walker at home.  She did not have a PCP.  During admission, Echo EF 10 to 15%, global hypokinesis, LV severely dilated, RV severely reduced, moderately elevated PASP, severe MR, mild TR.  It was suspected she had a nonischemic cardiomyopathy in the setting of LV dyssynchrony in the setting of LBBB.  Cardiomyopathy was thought out of proportion to CAD.  She was witnessed to have frequent atrial tachycardia runs with patient started on amiodarone.  She was thought not a great candidate for advanced care due to  noncompliance with medications.  Her condition precluded transplant.  She was not a good candidate for LVAD given CKD.  She was transitioned to torsemide 20 mg daily.  Due to CKD stage III, no ACE/ARB/Arni/MRA.  She was discharged on Coreg, digoxin, hydralazine, Imdur, Jardiance, and torsemide.  It was noted that she could be considered for BiV ICD as an outpatient.  Today, 02/05/2021, she returns to clinic and notes that she is overall doing well since her discharge.  She reports significantly improved volume status ever since her admission.  She states that she is taking her cardiac medications, though on further questioning, she is uncertain about several of her medications including amiodarone.  We reviewed her admission in great detail, including her echo and recommendation for BiV ICD as an outpatient.  She denies any chest pain or shortness of breath.  She reports rare palpitations.  She denies any symptoms of volume overload.  She reports bilateral lower extremity pain towards the end of walking, alleviating with rest.  She reports that she has to stop walking due to her legs, rather than her energy.  She is not checking her blood pressure at home with recommendation to start.  She has quit smoking.  She is agreeable to call the office to clarify which medications she is taking, especially as she is uncertain if taking amiodarone.  Fluid, salt, diet, and activity recommendations reviewed.  Home Medications   Current Outpatient Medications  Medication Instructions   acetaminophen (TYLENOL) 650 mg, Oral, Every 4 hours PRN   albuterol (VENTOLIN HFA) 108 (90 Base) MCG/ACT inhaler 2 puffs, Inhalation, Every 6 hours PRN   amiodarone (PACERONE) 200 MG tablet Take 1 tablet two times per day until 9/26, then on 9/26 start taking 1 tablet only once a day.   aspirin 81 mg, Oral, Daily   atorvastatin (LIPITOR) 80 mg, Oral, Daily-1800   carvedilol (COREG) 3.125 mg, Oral, 2 times daily with meals   digoxin  (LANOXIN) 0.125 mg, Oral, Daily   empagliflozin (JARDIANCE) 10 mg, Oral, Daily   Glycopyrrolate-Formoterol (BEVESPI AEROSPHERE) 9-4.8 MCG/ACT AERO 2 puffs, Inhalation, 2 times daily   hydrALAZINE (APRESOLINE) 25 mg, Oral, Every 8 hours   isosorbide mononitrate (IMDUR) 30 mg, Oral, Daily   torsemide (DEMADEX) 20 mg, Oral, Daily     Review of Systems    She denies chest pain, dyspnea, pnd, orthopnea, n, v, dizziness, syncope, edema, weight gain, or early satiety.  She reports bilateral lower extremity pain with ambulation and alleviating with rest.   She reports rare palpitations.  All other systems reviewed and are otherwise negative except as noted above.  Physical Exam    VS:  BP 120/60 (BP Location: Left Arm, Patient Position: Sitting, Cuff Size: Normal)   Pulse 71   Ht 5' (1.524 m)   Wt 145 lb (65.8 kg)   SpO2 93%   BMI 28.32 kg/m  , BMI Body mass index is 28.32 kg/m. GEN: Well nourished, well  developed, in no acute distress.  Joined by her son. HEENT: normal. Neck: Supple, right carotid bruit, no L carotid bruits, or masses. Cardiac: RRR, 2/6 systolic murmur.  No, rubs, or gallops. No clubbing, cyanosis. Bilateral LE skin changes consistent with vascular dz. Moderate to 1+ bilateral LEE.  Radials/DP/PT 2+ and equal bilaterally.  Respiratory:  Bilateral expiratory wheeze. GI: Soft, nontender, nondistended, BS + x 4. MS: no deformity or atrophy. Skin: warm and dry, no rash. Neuro:  Strength and sensation are intact. Psych: Normal affect.  Accessory Clinical Findings    ECG personally reviewed by me today - NSR, 71bpm, LAD with LBBB, prolonged PR 138ms, prolonged Qtc 460ms, baseline wander- no acute changes.  VITALS Reviewed today   Temp Readings from Last 3 Encounters:  01/28/21 (!) 96 F (35.6 C) (Oral)  01/22/21 97.6 F (36.4 C) (Oral)  07/30/18 (!) 97.4 F (36.3 C) (Oral)   BP Readings from Last 3 Encounters:  02/05/21 120/60  01/28/21 (!) 121/46  01/22/21  105/60   Pulse Readings from Last 3 Encounters:  02/05/21 71  01/28/21 72  01/22/21 63    Wt Readings from Last 3 Encounters:  02/05/21 145 lb (65.8 kg)  01/28/21 145 lb (65.8 kg)  01/22/21 139 lb (63 kg)     LABS  reviewed today   Lab Results  Component Value Date   WBC 11.3 (H) 01/22/2021   HGB 12.8 01/22/2021   HCT 40.0 01/22/2021   MCV 92.4 01/22/2021   PLT 293 01/22/2021   Lab Results  Component Value Date   CREATININE 1.10 (H) 01/28/2021   BUN 18 01/28/2021   NA 141 01/28/2021   K 4.6 01/28/2021   CL 97 01/28/2021   CO2 27 01/28/2021   Lab Results  Component Value Date   ALT 25 01/17/2021   AST 26 01/17/2021   ALKPHOS 81 01/17/2021   BILITOT 1.3 (H) 01/17/2021   Lab Results  Component Value Date   CHOL 156 07/27/2018   HDL 49 07/27/2018   LDLCALC 87 07/27/2018   TRIG 99 07/27/2018   CHOLHDL 3.2 07/27/2018    Lab Results  Component Value Date   HGBA1C 5.5 07/26/2018   Lab Results  Component Value Date   TSH 2.142 01/17/2021     STUDIES/PROCEDURES reviewed today   TTE 01/17/2021  1. Left ventricular ejection fraction, by estimation, is 10-15%. The left  ventricle has severely decreased function. The left ventricle demonstrates  global hypokinesis. The left ventricular internal cavity size was severely  dilated. Indeterminate  diastolic filling due to E-A fusion.   2. Right ventricular systolic function is severely reduced. The right  ventricular size is mildly enlarged. There is moderately elevated  pulmonary artery systolic pressure. The estimated right ventricular  systolic pressure is 10.6 mmHg.   3. Left atrial size was mildly dilated.   4. The mitral valve is grossly normal. Severe mitral valve regurgitation.  No evidence of mitral stenosis.   5. The aortic valve is tricuspid. There is mild thickening of the aortic  valve. Aortic valve regurgitation is not visualized. No aortic stenosis is  present.   6. The inferior vena cava is  normal in size with <50% respiratory  variability, suggesting right atrial pressure of 8 mmHg.   R/LHC Mid LM to Dist LM lesion is 20% stenosed. Prox LAD lesion is 80% stenosed. Ost 1st Diag lesion is 80% stenosed. Mid Cx lesion is 80% stenosed. SVG and is normal in caliber. The graft exhibits  no disease. LIMA and is normal in caliber. The graft exhibits no disease. 1.  Significant two-vessel coronary artery disease involving proximal LAD at the bifurcation of first diagonal as well as mid left circumflex.  Patent grafts including LIMA to LAD and SVG to diagonal.  The LAD gets mostly antegrade flow but in spite of that the LIMA is not atretic.  Left circumflex distribution area is medium in size. 2.  Right heart catheterization showed mildly elevated filling pressures with pulmonary wedge pressure of 13 mmHg, mild pulmonary hypertension at 38 over 40 mmHg and severely reduced cardiac output at 3.13 with an index of 1.81. Recommendations: No need to revascularize the LAD given that the LIMA is patent.  PCI of the left circumflex is possible but I doubt it will make significant difference on ejection fraction.  This should be reserved for refractory angina. Recommend optimizing medical therapy for chronic systolic heart failure. I switch furosemide to oral.  I added spironolactone.  The patient can likely be discharged home tomorrow if she remains stable. The plan is to switch her from losartan to [facility] as an outpatient.  Coronary Diagrams  Diagnostic Dominance: Right Intervention   Assessment & Plan    Systolic heart failure, EF 10 to 15% Severe functional mitral regurgitation Left bundle branch block - Reports resolution of previous symptoms of volume overload, though still noted to be somewhat volume up on exam.  Recent admission after not taking her cardiac medications.  She reports taking these medications today, though some medication confusion noted on review of her medication  list with her later during the visit.  As previously noted, it has been thought that her cardiomyopathy is nonischemic or a cardiomyopathy in the setting of left ventricular dyssynchrony in the setting of left bundle branch block.  Cardiac cath in 2010 showed patent grafts with small distal left circumflex disease.  It is thought that cardiomyopathy is out of proportion for CAD.  She is not a great candidate for advanced care, as she has been noncompliant with medications and age precludes transplant.  In addition, she is not a great candidate for LVAD given CKD.  Continue medical management.  Not a candidate for ACE/ARB/Arni/MRA given CKD stage III.  Continue torsemide, Coreg, Imdur, Jardiance, digoxin, hydralazine.  We will check BMET, CBC, and digoxin level today.  Reviewed salt and fluid restrictions.  We reviewed CHF education in great detail today with patient understanding.  Given recommendation that she be considered for BiV ICD as an outpatient, we will refer to EP.  Recommended that she work on medication compliance in order for EP to formally consider her as a candidate.  Coronary artery disease s/p bypass --No reported chest pain at rest or with exertion.   As above, her cardiomyopathy has been thought out of proportion with her CAD and thought 2/2 LV dyssynchrony in the setting of LBBB and with referral to EP.  Continue medical management with ASA, Coreg, statin, Imdur.  Stressed medication compliance.  Aggressive risk factor modification.  Remainder as above.  History of atrial tachycardia --NSR without ectopy today.  Reports rare palpitations with history of rapid atrial tachycardia during admission.  She is uncertain if she is still taking her amiodarone, though this is listed on her discharge paperwork.  She will call the office to confirm if taking this medication once home.  Will check LFTs, TSH, free T4 today in the event that she is still taking her amiodarone.  Amiodarone monitoring  guidelines reviewed  today.  Continue Coreg.  Continue digoxin with recheck of digoxin level today.   Essential hypertension, goal BP less than 130/80 --BP today well controlled.  Continue carvedilol, Imdur, torsemide.  No ACE/ARB/Arni/MRA given CKD.  HLD, LDL goal below 70 --Continue statin  CKD --Obtain BMET. No ACE/ARB/Arni/MRA given CKD.  Claudication symptoms --Reports claudication symptoms with ambulation.  We will refer to Dr. Fletcher Anon for further evaluation.  Palpable pulses on exam.  Continue to monitor.  Recommend continue ASA and statin.  COPD History of tobacco use within the last year --She reports that she continues to avoid smoking.  Congratulations provided.  Ongoing smoking cessation recommended.  Medication changes: None--she will call the office to confirm if taking medications listed as uncertain if taking amiodarone or Coreg BID. Labs ordered: Digoxin level, c-Met, TSH, free T4, CBC, lipids, direct LDL Studies / Imaging ordered: EP referral/Dr. Arida/vascular referral Future considerations: Pending labs and EP/vascular consultations Disposition: RTC 1 month  *Please be aware that the above documentation was completed voice recognition software and may contain dictation errors.     Arvil Chaco, PA-C 02/05/2021

## 2021-02-06 LAB — DIGOXIN LEVEL: Digoxin, Serum: 1.6 ng/mL — ABNORMAL HIGH (ref 0.5–0.9)

## 2021-02-06 LAB — CBC
Hematocrit: 42.4 % (ref 34.0–46.6)
Hemoglobin: 13.8 g/dL (ref 11.1–15.9)
MCH: 29.4 pg (ref 26.6–33.0)
MCHC: 32.5 g/dL (ref 31.5–35.7)
MCV: 90 fL (ref 79–97)
Platelets: 398 10*3/uL (ref 150–450)
RBC: 4.69 x10E6/uL (ref 3.77–5.28)
RDW: 14 % (ref 11.7–15.4)
WBC: 9.1 10*3/uL (ref 3.4–10.8)

## 2021-02-06 LAB — COMPREHENSIVE METABOLIC PANEL
ALT: 20 IU/L (ref 0–32)
AST: 40 IU/L (ref 0–40)
Albumin/Globulin Ratio: 1.4 (ref 1.2–2.2)
Albumin: 4.1 g/dL (ref 3.7–4.7)
Alkaline Phosphatase: 97 IU/L (ref 44–121)
BUN/Creatinine Ratio: 16 (ref 12–28)
BUN: 18 mg/dL (ref 8–27)
Bilirubin Total: 0.5 mg/dL (ref 0.0–1.2)
CO2: 25 mmol/L (ref 20–29)
Calcium: 9.7 mg/dL (ref 8.7–10.3)
Chloride: 95 mmol/L — ABNORMAL LOW (ref 96–106)
Creatinine, Ser: 1.1 mg/dL — ABNORMAL HIGH (ref 0.57–1.00)
Globulin, Total: 2.9 g/dL (ref 1.5–4.5)
Glucose: 85 mg/dL (ref 70–99)
Sodium: 140 mmol/L (ref 134–144)
Total Protein: 7 g/dL (ref 6.0–8.5)
eGFR: 52 mL/min/{1.73_m2} — ABNORMAL LOW (ref >59)

## 2021-02-06 LAB — LDL CHOLESTEROL, DIRECT: LDL Direct: 124 mg/dL — ABNORMAL HIGH (ref 0–99)

## 2021-02-06 LAB — T4, FREE: Free T4: 0.99 ng/dL (ref 0.82–1.77)

## 2021-02-06 LAB — TSH: TSH: 13.6 u[IU]/mL — ABNORMAL HIGH (ref 0.450–4.500)

## 2021-02-08 ENCOUNTER — Encounter: Payer: Self-pay | Admitting: Physician Assistant

## 2021-02-08 ENCOUNTER — Telehealth: Payer: Self-pay | Admitting: *Deleted

## 2021-02-08 DIAGNOSIS — R7889 Finding of other specified substances, not normally found in blood: Secondary | ICD-10-CM

## 2021-02-08 MED ORDER — DIGOXIN 125 MCG PO TABS: 0.1250 mg | ORAL_TABLET | ORAL | 2 refills | Status: DC

## 2021-02-08 NOTE — Telephone Encounter (Signed)
-----   Message from Lennon Alstrom, PA-C sent at 02/08/2021  6:51 AM EDT ----- Digoxin level elevated.  Hold digoxin for 3 days then restart at digoxin 0.125 every other day with repeat digoxin level 1 week thereafter.  Kidney function stable from 1 week ago with electrolytes unable to be quantified due to hemolyzed sample.    TSH significantly elevated with free T4 WNL.  She was to contact the office and let us know if still taking amiodarone.  H&H stable.  LDL not at goal.  Recommend RTC within the next 1 to 2 weeks, given the abnormal labs, and to make sure that she is taking the correct medications.

## 2021-02-08 NOTE — Progress Notes (Signed)
Internal Medicine Clinic Attending ° °I saw and evaluated the patient.  I personally confirmed the key portions of the history and exam documented by Dr. Atway and I reviewed pertinent patient test results.  The assessment, diagnosis, and plan were formulated together and I agree with the documentation in the resident’s note.  °

## 2021-02-08 NOTE — Telephone Encounter (Signed)
Spoke to pt.  Notified pt of results and provider's recc.  Pt voiced understanding. She has already taken Digoxin this morning.  Pt will:     - Hold Digoxin 10/11, 10/12, 10/13   - Resume Digoxin 0.125 mg EVERY OTHER DAY starting Friday 02/12/21   - Repeat digoxin level in our office  lab appt scheduled Monday 02/15/21 at 3:30 PM   - Follow up with Marisue Ivan, PA Friday 02/19/21 at 10:30 AM  Pt also reports that she is taking Amiodarone 200 mg daily.  Lab orders placed. Med list updated.  Pt has no further questions at this time.

## 2021-02-15 ENCOUNTER — Other Ambulatory Visit: Payer: Medicare Other

## 2021-02-16 ENCOUNTER — Other Ambulatory Visit: Payer: Self-pay

## 2021-02-16 ENCOUNTER — Other Ambulatory Visit (INDEPENDENT_AMBULATORY_CARE_PROVIDER_SITE_OTHER): Payer: Medicare Other

## 2021-02-16 ENCOUNTER — Telehealth: Payer: Self-pay | Admitting: *Deleted

## 2021-02-16 DIAGNOSIS — R7889 Finding of other specified substances, not normally found in blood: Secondary | ICD-10-CM | POA: Diagnosis not present

## 2021-02-16 NOTE — Telephone Encounter (Signed)
Pt in today for lab visit and CMA alerted this RN to assess pt's rash on arms that was reported.  Pt with raised rash similar to petichiael rash, pt reports itching and some areas look scabbed over.  Rash is on both arms, sides, abdomen, and both upper thighs.  Pt does also have psoriasis rash as well, but pt states this is new and not related.   Pt has NKDA. Rash started 2 weeks ago.  Pt was discharged from hospital 01/22/21 with six new medications: Carvedilol, Digoxin, Hydralazine, Imdur, Torsemide.  Pt states she has not taken Digoxin in 3 days.  Pt does report that the only other new medication is she had to get a replacement of her Albuterol inhaler recently because the pharmacy did not have Albuterol on hand and they gave her Bevespi to take twice daily.   Advised pt to go home and take otc Benadryl and not to drive after taking as it will make her sleepy.  Advised that I will send to Dr. Mariah Milling and PharmD since she has started 7 new medications and will need further recc regarding trying to pin point cause of reaction.  Also advised to let us know if any worsening rash or s/s.  Advised if swelling, or tingling in tongue or throat, trouble breathing, to call 911. Pt voiced understanding.

## 2021-02-16 NOTE — Telephone Encounter (Signed)
No way to know which medication (if any) are causing reaction.  Has no allergies noted in record.

## 2021-02-16 NOTE — Telephone Encounter (Signed)
Pt came into office today and mentioned that at her last office visit she had a discussion with Marisue Ivan, PA concerning a Handicap sticker permit. Pt mentioned that she was never given the form to take to [facility] and Harvel Quale may have forgot to give her the Handicap form she needed to be filled in order for to apply for one. Pt would like to see if she could have those forms filled and request Handicap permit. Pt mentioned that she is often short of breath when she has to walk very long distances.  Please advise.

## 2021-02-17 ENCOUNTER — Telehealth: Payer: Self-pay | Admitting: Physician Assistant

## 2021-02-17 LAB — DIGOXIN LEVEL: Digoxin, Serum: 0.8 ng/mL (ref 0.5–0.9)

## 2021-02-17 NOTE — Telephone Encounter (Signed)
Lennon Alstrom, PA-C  02/17/2021 12:06 PM EDT     Digoxin level improved and now at normal range.  Continue digoxin 0.125mg  every other day.

## 2021-02-17 NOTE — Telephone Encounter (Signed)
Discussed in office with Marisue Ivan, PA. She advised she would like to address the issue of the handicap placard at the patient's next office visit.  Her next appointment with Harvel Quale is scheduled this Friday 02/19/21 at 10:30 am.  Attempted to call the patient.  No answer- no voice mail set up.

## 2021-02-17 NOTE — Telephone Encounter (Signed)
Attempted to call the patient. No answer- no voice mail set up.  She has an appointment in the office to follow up with Marisue Ivan, PA on 02/19/21.

## 2021-02-17 NOTE — Telephone Encounter (Signed)
Dana [name], [place] [phone])  Sent: Wed February 17, 2021  2:09 PM  To: Jefferey Pica, RN          Message  She has been tolerating all her cardiac medications since they were initiated in the hospital and was also tolerating them at follow-up. I would recommend she follow-up with her PCP if she has developed a new rash. It is less likely to be a medication that she has been tolerating for a month and more likely to be something new introduced since that time (new OTC medication, detergent, etc).

## 2021-02-17 NOTE — Telephone Encounter (Signed)
Attempted to call the patient.  No answer- no voice mail.  

## 2021-02-17 NOTE — Telephone Encounter (Signed)
Dana [name],  Please advise what meds/ in what order she may hold so we can find out which one might be causing her issue.  Thank you!

## 2021-02-19 ENCOUNTER — Ambulatory Visit: Payer: Medicare Other | Admitting: Physician Assistant

## 2021-02-19 ENCOUNTER — Other Ambulatory Visit: Payer: Self-pay

## 2021-02-19 ENCOUNTER — Encounter: Payer: Self-pay | Admitting: Physician Assistant

## 2021-02-19 VITALS — BP 118/60 | HR 70 | Ht 60.0 in | Wt 147.4 lb

## 2021-02-19 DIAGNOSIS — E785 Hyperlipidemia, unspecified: Secondary | ICD-10-CM

## 2021-02-19 DIAGNOSIS — Z79899 Other long term (current) drug therapy: Secondary | ICD-10-CM

## 2021-02-19 DIAGNOSIS — I447 Left bundle-branch block, unspecified: Secondary | ICD-10-CM

## 2021-02-19 DIAGNOSIS — J449 Chronic obstructive pulmonary disease, unspecified: Secondary | ICD-10-CM

## 2021-02-19 DIAGNOSIS — R21 Rash and other nonspecific skin eruption: Secondary | ICD-10-CM

## 2021-02-19 DIAGNOSIS — I739 Peripheral vascular disease, unspecified: Secondary | ICD-10-CM

## 2021-02-19 DIAGNOSIS — N189 Chronic kidney disease, unspecified: Secondary | ICD-10-CM

## 2021-02-19 DIAGNOSIS — I340 Nonrheumatic mitral (valve) insufficiency: Secondary | ICD-10-CM

## 2021-02-19 DIAGNOSIS — Z87891 Personal history of nicotine dependence: Secondary | ICD-10-CM

## 2021-02-19 DIAGNOSIS — I2510 Atherosclerotic heart disease of native coronary artery without angina pectoris: Secondary | ICD-10-CM | POA: Diagnosis not present

## 2021-02-19 DIAGNOSIS — I10 Essential (primary) hypertension: Secondary | ICD-10-CM

## 2021-02-19 DIAGNOSIS — Z8679 Personal history of other diseases of the circulatory system: Secondary | ICD-10-CM

## 2021-02-19 DIAGNOSIS — I498 Other specified cardiac arrhythmias: Secondary | ICD-10-CM | POA: Diagnosis not present

## 2021-02-19 DIAGNOSIS — I5022 Chronic systolic (congestive) heart failure: Secondary | ICD-10-CM | POA: Diagnosis not present

## 2021-02-19 DIAGNOSIS — Z951 Presence of aortocoronary bypass graft: Secondary | ICD-10-CM

## 2021-02-19 NOTE — Progress Notes (Signed)
Office Visit    Patient Name: Dana [name] Date of Encounter: 02/19/2021  PCP:  Patient, No Pcp Per (Inactive)   [facility]  Cardiologist: Dr. Mariah Milling Advanced Practice Provider:  No care team member to display Electrophysiologist:  None [id number]}   Chief Complaint    Chief Complaint  Patient presents with   Other    1-2 wk f/u abnormal digoxin level c/o rash arms. Meds reviewed verbally with pt.     75 y.o. female  with a hx of CAD s/p 01/2012 bypass surgery, HFrEF / ICM with 2013 TTE showing EF 15 to 20 % and MR, current smoker, and who is being seen today for follow-up.  Past Medical History    Past Medical History:  Diagnosis Date   CHF (congestive heart failure) (HCC)    Complication of anesthesia    "difficulty waking up, it was all day long"   Coronary artery disease    Hyperlipidemia    LBBB (left bundle branch block)    Psoriasis    Past Surgical History:  Procedure Laterality Date   CARDIAC CATHETERIZATION  02/17/2012   CORONARY ARTERY BYPASS GRAFT  02/23/2012   Procedure: CORONARY ARTERY BYPASS GRAFTING (CABG);  Surgeon: Loreli Slot, MD;  Location: [facility] OR;  Service: Open Heart Surgery;  Laterality: N/A;  coronary artery bypass graft on pump times two utlizing left internal mammary artery and right greater saphenous vein harvested endoscopically, transesophageal echocardiogram    NO PAST SURGERIES     RIGHT/LEFT HEART CATH AND CORONARY/GRAFT ANGIOGRAPHY N/A 07/30/2018   Procedure: RIGHT/LEFT HEART CATH AND CORONARY/GRAFT ANGIOGRAPHY;  Surgeon: Iran Ouch, MD;  Location: [facility] INVASIVE CV LAB;  Service: Cardiovascular;  Laterality: N/A;   TEE WITHOUT CARDIOVERSION  02/22/2012   Procedure: TRANSESOPHAGEAL ECHOCARDIOGRAM (TEE);  Surgeon: Dolores Patty, MD;  Location: [facility] ENDOSCOPY;  Service: Cardiovascular;  Laterality: N/A;    Allergies  No Known Allergies  History of Present Illness    Dana [name] is a  75 y.o. female with PMH above.   She presented to [facility] in October 2013 with EF of 15-20% on TEE and cardiac catheterization showing severe proximal LAD and ostial diagonal disease & moderate proximal left circumflex disease.  She was transferred to [facility] in [place] and underwent coronary bypass grafting x2 on 02/23/2012 with epicardial pacemaker lead on the posterior lateral wall in the event she needed BiV pacer in the future.    She was then lost to follow-up.  She reportedly stopped taking her medication when she started to feel better, approximately 1 year following her CABG.  She continued to smoke.  Cath 07/2018 with patent grafts LIMA to LAD, VG to diagonal.  LAD received mostly antegrade flow in LIMA was not atretic.  RHC with mildly elevated filling pressures, PCWP 13 mmHg, PAH 38, CO 3.13, CI 1.81.  Left circumflex 80% asked.  PCI was possible but Dr. Kirke Corin did not feel it would improve EF.  Echo EF 20%, LV severely only dilated, remainder under CV studies.  She presented to the ED 12/2020 with increased dyspnea and LEE, and she was subsequently admitted for exacerbation of COPD and acute systolic CHF and IV diuresed.  She had run out of her stopped several of her cardiac her medications.  She reported cost of meds at approximately $500 per month.  She reported stopping smoking 2 weeks prior.  Ambulation was limited by hip and knee pain.  She did not have  a walker at home.  She did not have a PCP.  During admission, Echo EF 10 to 15%, global hypokinesis, LV severely dilated, RV severely reduced, moderately elevated PASP, severe MR, mild TR.  It was suspected she had a nonischemic cardiomyopathy in the setting of LV dyssynchrony in the setting of LBBB.  Cardiomyopathy was thought out of proportion to CAD.  She was witnessed to have frequent atrial tachycardia runs with patient started on amiodarone.  She was thought not a great candidate for advanced care due to noncompliance with medications.   Her condition precluded transplant.  She was not a good candidate for LVAD given CKD.  She was transitioned to torsemide 20 mg daily.  Due to CKD stage III, no ACE/ARB/Arni/MRA.  She was discharged on Coreg, digoxin, hydralazine, Imdur, Jardiance, and torsemide.  It was noted that she could be considered for BiV ICD as an outpatient.  Seen 10/7 and uncertain if taking certain medications. BiV ICD discussed but deferred per pt. She had rare palpitations and LE pain when walking.  She had quit smoking.  Labs checked with digoxin reduced and TSH noted to be elevated with nl T4. EP referral and vascular referral discussed.  Today, 02/19/2021, she returns to clinic and notes that she is overall doing well from a cardiac standpoint.  She is joined today by her daughter. She denies any chest pain reports her breathing is stable.  She has rare palpitations and ongoing leg pain when walking. She denies any symptoms of volume overload.  We reviewed the EP referral again with patient now agreeable for ICD referral.  She reports a recent rash that is different from her normal psoriasis with pustule rash noted all over her body but excluding her face.  This rash did not appear until over a month on her current medications.  She denies any recent changes in detergent or soaps/toiletries.  No recent exposure to new foods.  She does report changing her inhaler recently.  She has been checking her BP at home and usually 118/60 with heart rate 71-91. Recommendation that she follow-up with PCP or dermatologist regarding her rash.  Home Medications   Current Outpatient Medications  Medication Instructions   acetaminophen (TYLENOL) 650 mg, Oral, Every 4 hours PRN   albuterol (VENTOLIN HFA) 108 (90 Base) MCG/ACT inhaler 2 puffs, Inhalation, Every 6 hours PRN   amiodarone (PACERONE) 200 MG tablet Take 1 tablet two times per day until 9/26, then on 9/26 start taking 1 tablet only once a day.   aspirin 81 mg, Oral, Daily    atorvastatin (LIPITOR) 80 mg, Oral, Daily-1800   carvedilol (COREG) 3.125 mg, Oral, 2 times daily with meals   digoxin (LANOXIN) 0.125 mg, Oral, Every other day   empagliflozin (JARDIANCE) 10 mg, Oral, Daily   Glycopyrrolate-Formoterol (BEVESPI AEROSPHERE) 9-4.8 MCG/ACT AERO 2 puffs, Inhalation, 2 times daily   hydrALAZINE (APRESOLINE) 25 mg, Oral, Every 8 hours   isosorbide mononitrate (IMDUR) 30 mg, Oral, Daily   torsemide (DEMADEX) 20 mg, Oral, Daily     Review of Systems    She denies chest pain, dyspnea, pnd, orthopnea, n, v, dizziness, syncope, edema, weight gain, or early satiety.  She reports bilateral lower extremity pain with ambulation and alleviating with rest.   She reports rare palpitations.  All other systems reviewed and are otherwise negative except as noted above.  Physical Exam    VS:  BP 118/60 (BP Location: Left Arm, Patient Position: Sitting, Cuff Size: Normal)  Pulse 70   Ht 5' (1.524 m)   Wt 147 lb 6 oz (66.8 kg)   SpO2 97%   BMI 28.78 kg/m  , BMI Body mass index is 28.78 kg/m. GEN: Well nourished, well developed, in no acute distress.  Joined by her family. HEENT: normal. Neck: Supple, right carotid bruit, no L carotid bruits, or masses. Cardiac: RRR, 2/6 systolic murmur.  No, rubs, or gallops. No clubbing, cyanosis. Bilateral LE skin changes consistent with vascular dz. Moderate bilateral LEE.  Radials/DP/PT 2+ and equal bilaterally.  Respiratory:  Bilateral expiratory wheeze. GI: Soft, nontender, nondistended, BS + x 4. MS: no deformity or atrophy. Skin: warm and dry, no rash. Neuro:  Strength and sensation are intact. Psych: Normal affect.  Accessory Clinical Findings    ECG personally reviewed by me today -no ekg- no acute changes.  VITALS Reviewed today   Temp Readings from Last 3 Encounters:  01/28/21 (!) 96 F (35.6 C) (Oral)  01/22/21 97.6 F (36.4 C) (Oral)  07/30/18 (!) 97.4 F (36.3 C) (Oral)   BP Readings from Last 3 Encounters:   02/19/21 118/60  02/05/21 120/60  01/28/21 (!) 121/46   Pulse Readings from Last 3 Encounters:  02/19/21 70  02/05/21 71  01/28/21 72    Wt Readings from Last 3 Encounters:  02/19/21 147 lb 6 oz (66.8 kg)  02/05/21 145 lb (65.8 kg)  01/28/21 145 lb (65.8 kg)     LABS  reviewed today   Lab Results  Component Value Date   WBC 9.1 02/05/2021   HGB 13.8 02/05/2021   HCT 42.4 02/05/2021   MCV 90 02/05/2021   PLT 398 02/05/2021   Lab Results  Component Value Date   CREATININE 1.10 (H) 02/05/2021   BUN 18 02/05/2021   NA 140 02/05/2021   K CANCELED 02/05/2021   CL 95 (L) 02/05/2021   CO2 25 02/05/2021   Lab Results  Component Value Date   ALT 20 02/05/2021   AST 40 02/05/2021   ALKPHOS 97 02/05/2021   BILITOT 0.5 02/05/2021   Lab Results  Component Value Date   CHOL 156 07/27/2018   HDL 49 07/27/2018   LDLCALC 87 07/27/2018   LDLDIRECT 124 (H) 02/05/2021   TRIG 99 07/27/2018   CHOLHDL 3.2 07/27/2018    Lab Results  Component Value Date   HGBA1C 5.5 07/26/2018   Lab Results  Component Value Date   TSH 13.600 (H) 02/05/2021     STUDIES/PROCEDURES reviewed today   TTE 01/17/2021  1. Left ventricular ejection fraction, by estimation, is 10-15%. The left  ventricle has severely decreased function. The left ventricle demonstrates  global hypokinesis. The left ventricular internal cavity size was severely  dilated. Indeterminate  diastolic filling due to E-A fusion.   2. Right ventricular systolic function is severely reduced. The right  ventricular size is mildly enlarged. There is moderately elevated  pulmonary artery systolic pressure. The estimated right ventricular  systolic pressure is 45.7 mmHg.   3. Left atrial size was mildly dilated.   4. The mitral valve is grossly normal. Severe mitral valve regurgitation.  No evidence of mitral stenosis.   5. The aortic valve is tricuspid. There is mild thickening of the aortic  valve. Aortic valve  regurgitation is not visualized. No aortic stenosis is  present.   6. The inferior vena cava is normal in size with <50% respiratory  variability, suggesting right atrial pressure of 8 mmHg.   R/LHC Mid LM to [facility]  LM lesion is 20% stenosed. Prox LAD lesion is 80% stenosed. Ost 1st Diag lesion is 80% stenosed. Mid Cx lesion is 80% stenosed. SVG and is normal in caliber. The graft exhibits no disease. LIMA and is normal in caliber. The graft exhibits no disease. 1.  Significant two-vessel coronary artery disease involving proximal LAD at the bifurcation of first diagonal as well as mid left circumflex.  Patent grafts including LIMA to LAD and SVG to diagonal.  The LAD gets mostly antegrade flow but in spite of that the LIMA is not atretic.  Left circumflex distribution area is medium in size. 2.  Right heart catheterization showed mildly elevated filling pressures with pulmonary wedge pressure of 13 mmHg, mild pulmonary hypertension at 38 over 40 mmHg and severely reduced cardiac output at 3.13 with an index of 1.81. Recommendations: No need to revascularize the LAD given that the LIMA is patent.  PCI of the left circumflex is possible but I doubt it will make significant difference on ejection fraction.  This should be reserved for refractory angina. Recommend optimizing medical therapy for chronic systolic heart failure. I switch furosemide to oral.  I added spironolactone.  The patient can likely be discharged home tomorrow if she remains stable. The plan is to switch her from losartan to [facility] as an outpatient.  Coronary Diagrams  Diagnostic Dominance: Right Intervention   Assessment & Plan    Chronic systolic heart failure, EF 10 to 15% Severe functional mitral regurgitation Left bundle branch block - Reports ongoing improvement in sx of overload. As previously noted, it has been thought that her cardiomyopathy is nonischemic or a cardiomyopathy in the setting of left  ventricular dyssynchrony in the setting of left bundle branch block.  Cardiac cath in 2010 showed patent grafts with small distal left circumflex disease.  It is thought that cardiomyopathy is out of proportion for CAD.  She is not a great candidate for advanced care, as she has been noncompliant with medications and age precludes transplant.  Documented as not a great candidate for LVAD given CKD. Chart sent to primary cardiologist same day, given reduced EF and no lifevest.  Continue medical management.  Not a candidate for ACE/ARB/Arni/MRA given CKD stage III.  Continue torsemide, Coreg, Imdur, Jardiance, reduced dosing digoxin (following labs as outlined above in HPI and directly below), hydralazine.  CHF education provided. Referred to EP for consideration of ICD.  Coronary artery disease s/p bypass --No reported chest pain at rest or with exertion.   As above, her cardiomyopathy has been thought out of proportion with her CAD and thought 2/2 LV dyssynchrony in the setting of LBBB and with referral to EP today.  Continue medical management with ASA, Coreg, statin, Imdur.   Aggressive risk factor modification.  Remainder as above.  History of atrial tachycardia --Rare palpitations. Recent adjustment of digoxin given elevated digoxin level with repeat level wnl. TSH elevated with FT4 wnl. Will continue Coreg, reduced dosing digoxin, amiodarone. EP referral provided.  Essential hypertension, goal BP less than 130/80 --BP today well controlled.  Continue carvedilol, Imdur, torsemide.  No ACE/ARB/Arni/MRA given CKD.  HLD, LDL goal below 70 --Continue statin. Will hold off on adding Zetia today given her rash.  CKD --Obtain BMET. No ACE/ARB/Arni/MRA given CKD.  Claudication symptoms --Reports claudication symptoms with ambulation.  Already referred to Dr. Kirke Corin for further evaluation.  Palpable pulses on exam.  Continue to monitor.  Recommend continue ASA and statin.  COPD History of tobacco use  within  the last year --She reports that she continues to avoid smoking.  Congratulations provided.  Ongoing smoking cessation recommended.  Rash --Follow-up with PCP/Dermatology as recommended.   Medication changes: None. Labs ordered: None Studies / Imaging ordered: EP referral (Dr. Kirke Corin vascular referral already provided) Future considerations: Hold off on adding Zetia until RTC given rash today (until can see PCP/Dermatology) Disposition: RTC 1 month  *Please be aware that the above documentation was completed voice recognition software and may contain dictation errors.     Lennon Alstrom, PA-C 02/19/2021

## 2021-02-19 NOTE — Telephone Encounter (Signed)
Patient seen by JV on 02/19/21. Closing this encounter.

## 2021-02-19 NOTE — Telephone Encounter (Signed)
Patient seen by JV on 02/19/21. Closing this encounter. 

## 2021-02-19 NOTE — Telephone Encounter (Signed)
Patient seen in office today. Application for disability parking placard signed by Ann Maki, PA today and given to the patient to complete her portion.

## 2021-02-19 NOTE — Patient Instructions (Addendum)
Medication Instructions:  - Your physician recommends that you continue on your current medications as directed. Please refer to the Current Medication list given to you today.  *If you need a refill on your cardiac medications before your next appointment, please call your pharmacy*   Lab Work: - none ordered  If you have labs (blood work) drawn today and your tests are completely normal, you will receive your results only by: MyChart Message (if you have MyChart) OR A paper copy in the mail If you have any lab test that is abnormal or we need to change your treatment, we will call you to review the results.   Testing/Procedures: - You have been referred to : Electrophysiology- Dr. Graciela Husbands Dr. Lalla Brothers for consideration of ICD    Follow-Up: At [facility], you and your health needs are our priority.  As part of our continuing mission to provide you with exceptional heart care, we have created designated Provider Care Teams.  These Care Teams include your primary Cardiologist (physician) and Advanced Practice Providers (APPs -  Physician Assistants and Nurse Practitioners) who all work together to provide you with the care you need, when you need it.  We recommend signing up for the patient portal called "MyChart".  Sign up information is provided on this After Visit Summary.  MyChart is used to connect with patients for Virtual Visits (Telemedicine).  Patients are able to view lab/test results, encounter notes, upcoming appointments, etc.  Non-urgent messages can be sent to your provider as well.   To learn more about what you can do with MyChart, go to [email or web address].    Your next appointment:   As scheduled with Dr. Mariah Milling  The format for your next appointment:   In Person  Provider:   Dr. Julien Nordmann   Other Instructions Follow up with your Primary Care/ Dermatology for your rash

## 2021-02-21 ENCOUNTER — Encounter: Payer: Self-pay | Admitting: Physician Assistant

## 2021-02-22 ENCOUNTER — Other Ambulatory Visit: Payer: Self-pay | Admitting: Pharmacist

## 2021-02-22 NOTE — Telephone Encounter (Signed)
Patient called and left voicemail stating she needs refills on her prescriptions. Of note patient does not have PCP assigned but upon last discussion with patient she is planning to establish care at our office. Not sure the process for assigning PCP.

## 2021-02-23 NOTE — Telephone Encounter (Addendum)
Pt stated she needs refills on all of her meds and send refills to [facility] in  [place]. Also stated she needs  Anoro filled thru the medication assistance program.

## 2021-02-23 NOTE — Addendum Note (Signed)
Addended by: Hassan Buckler on: 02/23/2021 04:03 PM   Modules accepted: Orders

## 2021-02-24 MED ORDER — HYDRALAZINE HCL 25 MG PO TABS: 25.0000 mg | ORAL_TABLET | Freq: Three times a day (TID) | ORAL | 2 refills | Status: DC

## 2021-02-24 MED ORDER — ALBUTEROL SULFATE HFA 108 (90 BASE) MCG/ACT IN AERS: 2.0000 | INHALATION_SPRAY | Freq: Four times a day (QID) | RESPIRATORY_TRACT | 3 refills | Status: DC | PRN

## 2021-02-24 MED ORDER — ISOSORBIDE MONONITRATE ER 30 MG PO TB24: 30.0000 mg | ORAL_TABLET | Freq: Every day | ORAL | 2 refills | Status: DC

## 2021-02-24 MED ORDER — AMIODARONE HCL 200 MG PO TABS: 200.0000 mg | ORAL_TABLET | Freq: Every day | ORAL | 3 refills | Status: DC

## 2021-02-24 MED ORDER — TORSEMIDE 20 MG PO TABS: 20.0000 mg | ORAL_TABLET | Freq: Every day | ORAL | 2 refills | Status: DC

## 2021-02-24 MED ORDER — ATORVASTATIN CALCIUM 80 MG PO TABS: 80.0000 mg | ORAL_TABLET | Freq: Every day | ORAL | 5 refills | Status: DC

## 2021-02-24 MED ORDER — CARVEDILOL 3.125 MG PO TABS: 3.1250 mg | ORAL_TABLET | Freq: Two times a day (BID) | ORAL | 3 refills | Status: DC

## 2021-02-24 MED ORDER — DIGOXIN 125 MCG PO TABS: 0.1250 mg | ORAL_TABLET | ORAL | 2 refills | Status: DC

## 2021-02-24 NOTE — Progress Notes (Signed)
Submitted application for [employer] to [employer]) for patient assistance.   Phone: [phone]

## 2021-03-05 ENCOUNTER — Telehealth: Payer: Self-pay | Admitting: Pharmacist

## 2021-03-05 NOTE — Telephone Encounter (Signed)
Patient states she is running low on her Jardiance.    She also states she is "breaking out in bumps and they are all over her arm and her body." She was wondering if it is medication related.   Of note patient has not had any medication changes recently and reports continuing to be on the same medications from our last office visit.   Scheduled appointment with PCP on 03/11/21 at 2:45 PM

## 2021-03-07 NOTE — Progress Notes (Signed)
Evaluation Performed:  Follow-up visit  Date:  03/07/2021   ID:  Dana [name], DOB April 24, 1946, MRN [id number]  Patient Location:  [address] [place] [address]   Provider location:   [facility], [place] office  PCP:  Marolyn Haller, MD  Cardiologist:  [facility]   Chief Complaint:  rash   History of Present Illness:    Dana [name] is a 75 y.o. female  past medical history of smoking, anxiety attacks ,  [facility] in October 2013 with systolic CHF, ejection fraction 25% severe left ventricular dysfunction with an EF of 15% on echocardiogram,  cardiac catheterization showing severe proximal LAD and ostial diagonal disease, moderate proximal left circumflex disease,  transferred to [facility], had coronary bypass grafting x2 on 02/23/2012  with  epicardial pacemaker lead on the posterior lateral wall  Lost to follow-up/medication noncompliance In the hospital March 2020 exacerbation of her systolic CHF Catheterization March 2020 Still smoking Who f/u for acute on chronic diastolic and systolic CHF  Last seen by myself in person in 2013, Has been seen by one of our other providers in the office  In follow-up today reports that she was started on a New inhaler approximately one month ago shortly after starting the inhaler (bevespi), she developed a severe rash Extending up both arms, legs Very itchy, Benadryl not helping Son who presents with her today confirms rash is new, timing seem to coincide with the inhaler  Recent echo as detailed below Some reports some history of med noncompliance in the past, but has been more compliant with her medications recently Jardiance expensive for her, needs a refill Off the digoxin  Echo results reviewed from September 2022, no change from 2020 . Left ventricular ejection fraction, by estimation, is 10-15%. The left  ventricle has severely decreased function. The left ventricle demonstrates   global hypokinesis. The left ventricular internal cavity size was severely  dilated. Indeterminate  diastolic filling due to E-A fusion.   2. Right ventricular systolic function is severely reduced. The right  ventricular size is mildly enlarged. There is moderately elevated  pulmonary artery systolic pressure. The estimated right ventricular  systolic pressure is 45.7 mmHg.   3. Left atrial size was mildly dilated.   4. The mitral valve is grossly normal. Severe mitral valve regurgitation.  Cath 07/2018, reviewed Mid LM to Dist LM lesion is 20% stenosed. Prox LAD lesion is 80% stenosed. Ost 1st Diag lesion is 80% stenosed. Mid Cx lesion is 80% stenosed. SVG and is normal in caliber. The graft exhibits no disease. LIMA and is normal in caliber. The graft exhibits no disease.   1.  Significant two-vessel coronary artery disease involving proximal LAD at the bifurcation of first diagonal as well as mid left circumflex.  Patent grafts including LIMA to LAD and SVG to diagonal.  The LAD gets mostly antegrade flow but in spite of that the LIMA is not atretic.  Left circumflex distribution area is medium in size. 2.  Right heart catheterization showed mildly elevated filling pressures with pulmonary wedge pressure of 13 mmHg, mild pulmonary hypertension at 38 over 40 mmHg and severely reduced cardiac output at 3.13 with an index of 1.81.   Recommendations: No need to revascularize the LAD given that the LIMA is patent.  PCI of the left circumflex is possible but I doubt it will make significant difference on ejection fraction.  This should be reserved for refractory  angina.   Discharge from the hospital July 30, 2018 for acute on chronic diastolic and systolic CHF, COPD exacerbation   Echocardiogram 02/17/2012 showed ejection fraction less than 25%, severe global hypokinesis, normal right ventricular systolic function, mild to moderate MR, mild to moderate TR, right ventricular systolic  pressure estimated at 50-60 mmHg   Cardiac cath 02/17/2012 showing severe proximal LAD and ostial diagonal disease, bifurcation disease. Estimated at 95%. Moderate proximal left circumflex disease, small to moderate size vessel. Severely depressed ejection fraction estimated at 25%.   Prior CV studies:   The following studies were reviewed today:  TTE 07/26/2018 1. The left ventricle has severely reduced systolic function, with an ejection fraction < 20%. The cavity size was severely dilated. Global hypokinesis, Concern for severe hypokinesis/possible akinesis of the anterior/anterseptal wall/apical. Unable to  determine diastolic parameters  2. The right ventricle has mildly reduced systolic function. The cavity was normal. There is no increase in right ventricular wall thickness. Right ventricular systolic pressure is mildly elevated with an estimated pressure of 37.7 mmHg.  3. The aortic valve is grossly normal Moderate calcification of the aortic valve. Aortic valve regurgitation was not assessed by color flow Doppler.  4. Mitral valve regurgitation is moderate  5. Left atrial size was moderately dilated.    Cath 07/30/2018 Mid LM to Dist LM lesion is 20% stenosed. Prox LAD lesion is 80% stenosed. Ost 1st Diag lesion is 80% stenosed. Mid Cx lesion is 80% stenosed. SVG and is normal in caliber. The graft exhibits no disease. LIMA and is normal in caliber. The graft exhibits no disease.    1.  Significant two-vessel coronary artery disease involving proximal LAD at the bifurcation of first diagonal as well as mid left circumflex.  Patent grafts including LIMA to LAD and SVG to diagonal.  The LAD gets mostly antegrade flow but in spite of that the LIMA is not atretic.  Left circumflex distribution area is medium in size. 2.  Right heart catheterization showed mildly elevated filling pressures with pulmonary wedge pressure of 13 mmHg, mild pulmonary hypertension at 38 over 40 mmHg and  severely reduced cardiac output at 3.13 with an index of 1.81.   Recommendations: No need to revascularize the LAD given that the LIMA is patent.   PCI of the left circumflex is possible but I doubt it will make significant difference on ejection fraction.  This should be reserved for refractory angina.  Past Medical History:  Diagnosis Date   CHF (congestive heart failure) (HCC)    Complication of anesthesia    "difficulty waking up, it was all day long"   Coronary artery disease    Hyperlipidemia    LBBB (left bundle branch block)    Psoriasis    Past Surgical History:  Procedure Laterality Date   CARDIAC CATHETERIZATION  02/17/2012   CORONARY ARTERY BYPASS GRAFT  02/23/2012   Procedure: CORONARY ARTERY BYPASS GRAFTING (CABG);  Surgeon: Melrose Nakayama, MD;  Location: [facility];  Service: Open Heart Surgery;  Laterality: N/A;  coronary artery bypass graft on pump times two utlizing left internal mammary artery and right greater saphenous vein harvested endoscopically, transesophageal echocardiogram    NO PAST SURGERIES     RIGHT/LEFT HEART CATH AND CORONARY/GRAFT ANGIOGRAPHY N/A 07/30/2018   Procedure: RIGHT/LEFT HEART CATH AND CORONARY/GRAFT ANGIOGRAPHY;  Surgeon: Wellington Hampshire, MD;  Location: [facility];  Service: Cardiovascular;  Laterality: N/A;   TEE WITHOUT CARDIOVERSION  02/22/2012   Procedure: TRANSESOPHAGEAL ECHOCARDIOGRAM (  TEE);  Surgeon: Jolaine Artist, MD;  Location: [facility] ENDOSCOPY;  Service: Cardiovascular;  Laterality: N/A;     No outpatient medications have been marked as taking for the 03/08/21 encounter (Appointment) with Minna Merritts, MD.     Allergies:   Patient has no known allergies.   Social History   Tobacco Use   Smoking status: Former    Types: Cigarettes    Quit date: 11/30/2020    Years since quitting: 0.2   Smokeless tobacco: Never   Tobacco comments:    Smoked but cut down when started to get sick  Substance Use Topics    Alcohol use: No   Drug use: No     Current Outpatient Medications on File Prior to Visit  Medication Sig Dispense Refill   acetaminophen (TYLENOL) 325 MG tablet Take 2 tablets (650 mg total) by mouth every 4 (four) hours as needed for headache or mild pain.     albuterol (VENTOLIN HFA) 108 (90 Base) MCG/ACT inhaler Inhale 2 puffs into the lungs every 6 (six) hours as needed for wheezing or shortness of breath. 6.7 g 3   amiodarone (PACERONE) 200 MG tablet Take 1 tablet (200 mg total) by mouth daily. 90 tablet 3   aspirin 81 MG chewable tablet Chew 1 tablet (81 mg total) by mouth daily. 30 tablet 5   atorvastatin (LIPITOR) 80 MG tablet Take 1 tablet (80 mg total) by mouth daily at 6 PM. 30 tablet 5   carvedilol (COREG) 3.125 MG tablet Take 1 tablet (3.125 mg total) by mouth 2 (two) times daily with a meal. 180 tablet 3   digoxin (LANOXIN) 0.125 MG tablet Take 1 tablet (0.125 mg total) by mouth every other day. 90 tablet 2   empagliflozin (JARDIANCE) 10 MG TABS tablet Take 1 tablet (10 mg total) by mouth daily. 30 tablet 6   Glycopyrrolate-Formoterol (BEVESPI AEROSPHERE) 9-4.8 MCG/ACT AERO Inhale 2 puffs into the lungs in the morning and at bedtime. 10.7 g 1   hydrALAZINE (APRESOLINE) 25 MG tablet Take 1 tablet (25 mg total) by mouth every 8 (eight) hours. 90 tablet 2   isosorbide mononitrate (IMDUR) 30 MG 24 hr tablet Take 1 tablet (30 mg total) by mouth daily. 90 tablet 2   torsemide (DEMADEX) 20 MG tablet Take 1 tablet (20 mg total) by mouth daily. 90 tablet 2   No current facility-administered medications on file prior to visit.     Family Hx: The patient's family history includes Heart disease in her father.  ROS:   Please see the history of present illness.    Review of Systems  Constitutional: Negative.   HENT: Negative.    Respiratory:  Positive for shortness of breath.   Cardiovascular: Negative.   Gastrointestinal: Negative.   Musculoskeletal: Negative.   Neurological:  Negative.   Psychiatric/Behavioral: Negative.    All other systems reviewed and are negative.    Labs/Other Tests and Data Reviewed:    Recent Labs: 01/16/2021: B Natriuretic Peptide 2,682.2 01/22/2021: Magnesium 2.4 02/05/2021: ALT 20; BUN 18; Creatinine, Ser 1.10; Hemoglobin 13.8; Platelets 398; Potassium CANCELED; Sodium 140; TSH 13.600   Recent Lipid Panel Lab Results  Component Value Date/Time   CHOL 156 07/27/2018 04:17 AM   CHOL 123 02/17/2012 03:14 AM   TRIG 99 07/27/2018 04:17 AM   TRIG 99 02/17/2012 03:14 AM   HDL 49 07/27/2018 04:17 AM   HDL 30 (L) 02/17/2012 03:14 AM   CHOLHDL 3.2 07/27/2018 04:17 AM  LDLCALC 87 07/27/2018 04:17 AM   LDLCALC 73 02/17/2012 03:14 AM   LDLDIRECT 124 (H) 02/05/2021 12:56 PM    Wt Readings from Last 3 Encounters:  02/19/21 147 lb 6 oz (66.8 kg)  02/05/21 145 lb (65.8 kg)  01/28/21 145 lb (65.8 kg)     Exam:    BP (!) 120/50 (BP Location: Left Arm, Patient Position: Sitting, Cuff Size: Normal)   Pulse 84   Ht 5' (1.524 m)   Wt 150 lb 2 oz (68.1 kg)   SpO2 95%   BMI 29.32 kg/m   Constitutional:  oriented to person, place, and time. No distress.  HENT:  Head: Grossly normal Eyes:  no discharge. No scleral icterus.  Neck: No JVD, no carotid bruits  Cardiovascular: Regular rate and rhythm, no murmurs appreciated Pulmonary/Chest: Clear to auscultation bilaterally, no wheezes or rails Abdominal: Soft.  no distension.  no tenderness.  Musculoskeletal: Normal range of motion Neurological:  normal muscle tone. Coordination normal. No atrophy Skin: Skin warm and dry Psychiatric: normal affect, pleasant  ASSESSMENT & PLAN:    Coronary artery disease of native artery of native heart with stable angina pectoris ([place]) Ischemic work-up in 2020, medical management recommended at that time, had significant circumflex disease but recommendation not to treat by interventionalist -She denies any anginal symptoms at this time Smoking  cessation recommended, compliance with her medications  Chronic systolic (congestive) heart failure (HCC) Ejection fraction 20% dating back 10 years Recommend she continue current list of including carvedilol, digoxin, Jardiance, hydralazine, isosorbide Given 5 pound weight gain over the past several months and worsening leg swelling recommend she increase torsemide up to 40 daily with extra torsemide after lunch if weight does not improve -Discussed her high fluid intake, recommended she moderate her fluids, coffee  Cardiomyopathy, ischemic Smoking cessation recommended Compliance with her statin She has appointment with EP in 10 days time concerning ICD placement Long discussion concerning risk and benefit of the device We have recommended she talk with EP to determine if she is even a candidate.  She is not an insignificant risk of complications from the procedure  Centrilobular emphysema (HCC) Rash on her new inhaler, Recommend she stop the inhaler and talk with pulmonary/primary care Smoking cessation recommended   Total encounter time more than 35 minutes  Greater than 50% was spent in counseling and coordination of care with the patient    Signed, Ida Rogue, MD  03/07/2021 6:11 PM    [place] [facility] [address], [place], [place] [address]

## 2021-03-08 ENCOUNTER — Other Ambulatory Visit: Payer: Self-pay

## 2021-03-08 ENCOUNTER — Encounter: Payer: Self-pay | Admitting: Cardiovascular Disease

## 2021-03-08 ENCOUNTER — Ambulatory Visit: Payer: Medicare Other | Admitting: Cardiovascular Disease

## 2021-03-08 VITALS — BP 120/50 | HR 84 | Ht 60.0 in | Wt 150.1 lb

## 2021-03-08 DIAGNOSIS — I5022 Chronic systolic (congestive) heart failure: Secondary | ICD-10-CM | POA: Diagnosis not present

## 2021-03-08 DIAGNOSIS — I10 Essential (primary) hypertension: Secondary | ICD-10-CM

## 2021-03-08 DIAGNOSIS — E785 Hyperlipidemia, unspecified: Secondary | ICD-10-CM | POA: Diagnosis not present

## 2021-03-08 DIAGNOSIS — I25118 Atherosclerotic heart disease of native coronary artery with other forms of angina pectoris: Secondary | ICD-10-CM

## 2021-03-08 DIAGNOSIS — I340 Nonrheumatic mitral (valve) insufficiency: Secondary | ICD-10-CM

## 2021-03-08 DIAGNOSIS — I255 Ischemic cardiomyopathy: Secondary | ICD-10-CM

## 2021-03-08 MED ORDER — TORSEMIDE 20 MG PO TABS: 40.0000 mg | ORAL_TABLET | Freq: Every day | ORAL | 3 refills | Status: DC

## 2021-03-08 NOTE — Patient Instructions (Addendum)
Cut back on coffee  Medication Instructions:  Please increase  torsemide up to 40 mg daily extra torsemide 40 mg in the afternoon  as needed for swelling  Patient assistance for jardiance Please complete form and bring back to office   If you need a refill on your cardiac medications before your next appointment, please call your pharmacy.    Lab work: No new labs needed  Testing/Procedures: No new testing needed  Follow-Up: At [facility], you and your health needs are our priority.  As part of our continuing mission to provide you with exceptional heart care, we have created designated Provider Care Teams.  These Care Teams include your primary Cardiologist (physician) and Advanced Practice Providers (APPs -  Physician Assistants and Nurse Practitioners) who all work together to provide you with the care you need, when you need it.  You will need a follow up appointment in 1 month  Providers on your designated Care Team:   Nicolasa Ducking, NP Eula Listen, PA-C Cadence Fransico Michael, [place]  COVID-19 Vaccine Information can be found at: [email or web address] For questions related to vaccine distribution or appointments, please email [email or web address] or call [phone].

## 2021-03-09 ENCOUNTER — Telehealth: Payer: Self-pay

## 2021-03-09 ENCOUNTER — Encounter: Payer: Self-pay | Admitting: Student

## 2021-03-09 ENCOUNTER — Other Ambulatory Visit: Payer: Self-pay | Admitting: Student

## 2021-03-09 MED ORDER — TIOTROPIUM BROMIDE-OLODATEROL 2.5-2.5 MCG/ACT IN AERS: 2.0000 | INHALATION_SPRAY | Freq: Every day | RESPIRATORY_TRACT | 2 refills | Status: DC

## 2021-03-09 NOTE — Progress Notes (Signed)
Patient called in stating that she had an allergic reaction after starting her most recently prescribed inhaler, Bevespi.  Our clinical pharmacy team is working to help the patient and seeing if her insurance will approve Stiolto inhaler.  Discussed with the patient that if her rash does not improve she needs tolerable clinic.  Also discussed with her that if she notices any worsening of the rash or she begins to have throat closing or shortness of breath she needs to call [phone] immediately.

## 2021-03-09 NOTE — Telephone Encounter (Signed)
Received TC from patient who states she had an appt w/ her cardiologist yesterday regarding a rash/hives all over her body.  She states her Cardiologist was of the opinion her recently started inhaler, Bevespi, was causing the rash.  He told her to stop it, which she did yesterday.    Patient had made an appt to discuss the rash w/ her PCP on 11/10, she is requesting to cancel this appt. (Appt cancelled and medication was added to allergy list)  Pt is also wanting to know what she should take in the place if Bevespi.  Will forward to PCP and blue team. Thank you, SChaplin, RN,BSN

## 2021-03-09 NOTE — Telephone Encounter (Signed)
I agree with stopping the Bevespi and seeing if the rash improves. Will reach out to the providers who made the changes to see why she was switched from [place] to [place]. I have discussed this with the patient.

## 2021-03-09 NOTE — Telephone Encounter (Signed)
Patient is requesting a call regarding med assistance for Stiolto. Thank you, SChaplin, RN,BSN

## 2021-03-11 ENCOUNTER — Encounter: Payer: Medicare Other | Admitting: Student

## 2021-03-17 ENCOUNTER — Institutional Professional Consult (permissible substitution): Payer: Medicare Other | Admitting: Cardiology

## 2021-03-19 ENCOUNTER — Telehealth: Payer: Self-pay

## 2021-03-19 NOTE — Telephone Encounter (Signed)
Received PA application Completed provider's portion, attached copy of insurance card and med list Dr. Gollan has signed Form faxed and placed in file cabinet  

## 2021-05-13 ENCOUNTER — Other Ambulatory Visit: Payer: Self-pay | Admitting: Internal Medicine

## 2021-05-19 ENCOUNTER — Other Ambulatory Visit: Payer: Self-pay | Admitting: Student

## 2021-05-26 ENCOUNTER — Institutional Professional Consult (permissible substitution): Payer: Medicare Other | Admitting: Cardiology

## 2021-05-28 ENCOUNTER — Other Ambulatory Visit: Payer: Self-pay | Admitting: Internal Medicine

## 2021-06-08 ENCOUNTER — Telehealth: Payer: Self-pay

## 2021-06-08 NOTE — Addendum Note (Signed)
Addended by: Velora Heckler on: 06/08/2021 02:55 PM   Modules accepted: Orders

## 2021-06-08 NOTE — Telephone Encounter (Signed)
Pt was trying to pickup meds from pharmacy and was told to contact pcp office because the provider who sent the rx's is not medicaid eligible. Pt recently got enrolled with medicaid. Could another provider send these medications to her pharmacy? It's most likely the hydralazine, stiolto and atorvastatin.

## 2021-06-09 ENCOUNTER — Institutional Professional Consult (permissible substitution): Payer: Medicare Other | Admitting: Cardiology

## 2021-06-09 MED ORDER — STIOLTO RESPIMAT 2.5-2.5 MCG/ACT IN AERS: 2.0000 | INHALATION_SPRAY | Freq: Every day | RESPIRATORY_TRACT | 0 refills | Status: DC

## 2021-06-09 MED ORDER — ATORVASTATIN CALCIUM 80 MG PO TABS
ORAL_TABLET | ORAL | 1 refills | Status: DC
Start: 2021-06-09 — End: 2022-09-01

## 2021-06-09 MED ORDER — HYDRALAZINE HCL 25 MG PO TABS
25.0000 mg | ORAL_TABLET | Freq: Three times a day (TID) | ORAL | 1 refills | Status: DC
Start: 2021-06-09 — End: 2022-11-15

## 2021-06-09 NOTE — Telephone Encounter (Signed)
Requested medication refills sent to [facility] pharmacy. Patient was last seen in clinic on 9/22 and recommended for 3 month follow up. Can we please have her scheduled for an office visit?

## 2021-06-09 NOTE — Addendum Note (Signed)
Addended by: Eliezer Bottom on: 06/09/2021 08:03 AM   Modules accepted: Orders

## 2021-06-30 ENCOUNTER — Other Ambulatory Visit (HOSPITAL_COMMUNITY): Payer: Self-pay

## 2021-07-01 ENCOUNTER — Other Ambulatory Visit (HOSPITAL_COMMUNITY): Payer: Self-pay

## 2021-07-21 ENCOUNTER — Institutional Professional Consult (permissible substitution): Payer: Self-pay | Admitting: Cardiology

## 2021-07-22 ENCOUNTER — Other Ambulatory Visit: Payer: Self-pay

## 2021-07-22 ENCOUNTER — Encounter: Payer: Self-pay | Admitting: Student

## 2021-07-22 ENCOUNTER — Ambulatory Visit (INDEPENDENT_AMBULATORY_CARE_PROVIDER_SITE_OTHER): Payer: Medicare HMO | Admitting: Student

## 2021-07-22 VITALS — BP 117/52 | HR 63 | Temp 97.6°F | Ht 60.0 in | Wt 157.6 lb

## 2021-07-22 DIAGNOSIS — J432 Centrilobular emphysema: Secondary | ICD-10-CM | POA: Diagnosis not present

## 2021-07-22 DIAGNOSIS — I340 Nonrheumatic mitral (valve) insufficiency: Secondary | ICD-10-CM

## 2021-07-22 DIAGNOSIS — Z720 Tobacco use: Secondary | ICD-10-CM

## 2021-07-22 DIAGNOSIS — I5042 Chronic combined systolic (congestive) and diastolic (congestive) heart failure: Secondary | ICD-10-CM | POA: Diagnosis not present

## 2021-07-22 DIAGNOSIS — I5022 Chronic systolic (congestive) heart failure: Secondary | ICD-10-CM | POA: Diagnosis not present

## 2021-07-22 DIAGNOSIS — I471 Supraventricular tachycardia: Secondary | ICD-10-CM | POA: Diagnosis not present

## 2021-07-22 MED ORDER — ENTRESTO 24-26 MG PO TABS: 1.0000 | ORAL_TABLET | Freq: Two times a day (BID) | ORAL | 1 refills | Status: AC

## 2021-07-22 NOTE — Patient Instructions (Signed)
For your heart failure we will stop hydralazine and IMDUR and start you on a low dose of the medicine ENTRESTO. We will make sure that your cardiology team is aware of this change as well.  ? ?If you develop any lightheadedness or dizziness with standing please call our office or your cardiology office.  ?

## 2021-07-23 ENCOUNTER — Other Ambulatory Visit: Payer: Self-pay | Admitting: Internal Medicine

## 2021-07-23 MED ORDER — NICOTINE POLACRILEX 4 MG MT GUM: 4.0000 mg | CHEWING_GUM | OROMUCOSAL | 0 refills | Status: DC | PRN

## 2021-07-23 MED ORDER — NICOTINE 21 MG/24HR TD PT24
21.0000 mg | MEDICATED_PATCH | TRANSDERMAL | 1 refills | Status: AC
Start: 2021-07-23 — End: 2021-09-21

## 2021-07-23 NOTE — Progress Notes (Signed)
? ?  CC: follow up of combined diastolic/systolic heart failure  ? ?HPI: ? ?[name] is a 76 y.o. F with combined systolic and diastolic congestive heart failure (EF 10-15% TTE 12/2020) reduction in EF out of proportion to ischemic disease, LBBB, CAD (prox LAD, ost 1st diagonal, mid Lcx s/p LIMA to LAD and SVG to diagonal CABG), HLD, and psoriasis.  ? ?See problem based charting for further details.  ? ?Past Medical History:  ?Diagnosis Date  ? CHF (congestive heart failure) (HCC)   ? Complication of anesthesia   ? "difficulty waking up, it was all day long"  ? Coronary artery disease   ? Hyperlipidemia   ? LBBB (left bundle branch block)   ? Psoriasis   ? ?Review of Systems:  See problem based charting for further details ? ?Physical Exam: ? ?Vitals:  ? 07/22/21 1404  ?BP: (!) 117/52  ?Pulse: 63  ?Temp: 97.6 ?F (36.4 ?C)  ?TempSrc: Esophageal  ?SpO2: 95%  ?Weight: 157 lb 9.6 oz (71.5 kg)  ?Height: 5' (1.524 m)  ? ?Constitutional: Well-developed, well-nourished, and in no distress.  ?HENT:  ?Head: Normocephalic and atraumatic.  ?Eyes: EOM are normal.  ?Neck: Normal range of motion.  ?Cardiovascular: Normal rate, regular rhythm, intact distal pulses. No gallop and no friction rub.  ?No murmur heard. No lower extremity edema  ?Pulmonary: Non labored breathing on room air, no wheezing or rales  ?Abdominal: Soft. Normal bowel sounds. Non distended and non tender ?Musculoskeletal: Normal range of motion.     ?   General: No tenderness or edema.  ?Neurological: Alert and oriented to person, place, and time. Non focal  ?Skin: Skin is warm and dry. Psoriasis plaques of bilateral lower extremities  ? ? ?Assessment & Plan:  ? ?See Encounters Tab for problem based charting. ? ?Patient discussed with Dr. Oswaldo Done ? ?

## 2021-07-23 NOTE — Assessment & Plan Note (Addendum)
Patient's most recent echo showed an EF of 10 to 15% (12/2020).  She most recently saw cardiology 03/2021. She underwent two-vessel CABG in 2013 with LIMA to LAD and SVG to first diagonal. When she presented to the hospital 12/2020 for acute on chronic combined systolic and diastolic heart failure, she underwent repeat left heart catheterization which showed known native left circumflex disease, and patent grafts. Patient's LCx disease was not thought to be contributing to her worsened EF. She does have known LBBB and in the past was intermittently adherent on her heart failure medications which was thought to be possible etiology for persistent severely reduced ejection fraction. Patient was to follow up with EP for consideration of BiV ICD however this appointment has been postponed multiple times in the setting of patient's trepidation.  ? ?During this appointment she was told to continue current list of including carvedilol, digoxin, Jardiance, hydralazine, isosorbide mononitrate. Her torsemide was also increased to 40mg  daily during that time.  ? ?Patient reports that since that time she has not had orthopnea, PND, early satiety, or feeling that her daily routine is limited by SOB, though she states she is not very active.  ? ?She is currently tolerating her current heart failure medications with no syncope or presyncope, lightheadedness or dizziness.  ? ?Of note, ARB/ARNI was discontinued during her hospitalization 12/2020 for CKD stage 3.  ? ?On exam patient appears euvolemic with no lower extremity edema, no rales on exam, and no appreciable JVD. ? ?Plan:  ?Discussed with patient that she would benefit from resuming her Delene Loll which she had previously tolerated. Her last BMET in our system (12/2020) shows GFR of 52. Will start at low dose and slowly titrate this as her blood pressures allow.  ?-Will discontinue her imdur and hydralazine ?-continue carvedilol 3.125mg  BID  ?-Patient has appointment scheduled for  08/2021 with EP to discuss potential BiV ICD placement. She would likely see benefit from this with respect to her EF ?-Follow up BMP in 4 weeks  ? ?Addendum: Showed worsening of patient's sCr compared to her baseline as well as a potassium of 4.9. Called patient and discussed that she should not start Entresto. She stated that she understood. We also discussed that she should continue her imdur and hydralazine.  ?

## 2021-07-23 NOTE — Assessment & Plan Note (Addendum)
Patient continues to use her rescue inhaler once every three days. She reports she continues to smoke and feels this is the reason for her shortness of breath. She is interested in trying patches and gum at this time. Will send a prescription for this now and follow up on her tobacco use when she presents for her follow up BMP.  ?-Continue tiotropium/LABA inhaler  ?

## 2021-07-23 NOTE — Assessment & Plan Note (Addendum)
Noted to have severe mitral valve regurgitation on TTE 12/2020. This is likely functional in the setting of her cardiomyopathy. Will continue with management of her heart failure and this will likely improve.  ?

## 2021-07-24 DIAGNOSIS — I471 Supraventricular tachycardia: Secondary | ICD-10-CM | POA: Insufficient documentation

## 2021-07-24 DIAGNOSIS — I4719 Other supraventricular tachycardia: Secondary | ICD-10-CM | POA: Insufficient documentation

## 2021-07-24 LAB — BMP8+ANION GAP
Anion Gap: 17 mmol/L (ref 10.0–18.0)
BUN/Creatinine Ratio: 13 (ref 12–28)
BUN: 20 mg/dL (ref 8–27)
CO2: 27 mmol/L (ref 20–29)
Calcium: 9.9 mg/dL (ref 8.7–10.3)
Chloride: 98 mmol/L (ref 96–106)
Creatinine, Ser: 1.52 mg/dL — ABNORMAL HIGH (ref 0.57–1.00)
Glucose: 86 mg/dL (ref 70–99)
Potassium: 4.9 mmol/L (ref 3.5–5.2)
Sodium: 142 mmol/L (ref 134–144)
eGFR: 35 mL/min/{1.73_m2} — ABNORMAL LOW (ref >59)

## 2021-07-24 NOTE — Assessment & Plan Note (Signed)
Patient had runs of an unspecified atrial tachycardia when she was hospitalized 12/2020 for acute on chronic combined systolic and diastolic heart failure. Her low blood pressures precluded the addition of a beta blocker and patient was loaded with amiodarone at that time.  It appears there was no concern for atrial flutter/fibrillation as she was not started on anticoagulation. Because patient's blood pressures now allow for beta blockade. It may be reasonable to stop amiodarone, get a 30d event monitor to see her burden of atrial tachycardia and to further clarify the specific type with beta blockade alone. At that point could likely discontinue amiodarone for good.   ? ?Will discuss this at next clinic visit.  ?

## 2021-07-26 ENCOUNTER — Telehealth: Payer: Self-pay | Admitting: Cardiovascular Disease

## 2021-07-26 NOTE — Telephone Encounter (Signed)
Patient states she had some labwork at her PCP and her kidney levels came back abnormal, and her PCP advised her to call her cardiologist. Please call to discuss.

## 2021-07-26 NOTE — Telephone Encounter (Signed)
Patient called regarding following lab results: ? ?Component Ref Range & Units 4 d ago ?(07/22/21) 5 mo ago ?(02/05/21) 5 mo ago ?(01/28/21) 6 mo ago ?(01/22/21) 6 mo ago ?(01/21/21) 6 mo ago ?(01/20/21) 6 mo ago ?(01/19/21)  ?Glucose 70 - 99 mg/dL 86  85 CM  99 CM  90 CM  86 CM  100 High  CM  99 CM   ?BUN 8 - 27 mg/dL $Remove'20  18  18  'gxqwMua$ 43 High  R  33 High  R  26 High  R  29 High  R   ?Creatinine, Ser 0.57 - 1.00 mg/dL 1.52 High   1.10 High   1.10 High   1.56 High  R  1.39 High  R  1.27 High  R  1.21 High  R   ?eGFR >59 mL/min/1.73 35 Low   52 Low   52 Low        ?BUN/Creatinine Ratio 12 - $Re'28 13  16  16       'mnx$ ?Sodium 134 - 144 mmol/L 142  140  141  136 R  138 R  139 R  138 R   ?Potassium 3.5 - 5.2 mmol/L 4.9  CANCELED R, CM  4.6  4.3 R  4.4 R  4.4 R  4.3 R   ?Chloride 96 - 106 mmol/L 98  95 Low   97  92 Low  R  92 Low  R  96 Low  R  97 Low  R   ?CO2 20 - 29 mmol/L $RemoveB'27  25  27  'irNJYkUK$ 33 High  R  38 High  R  33 High  R  31 R   ?Anion Gap 10.0 - 18.0 mmol/L 17.0   17.0  11 R, CM  8 R, CM  10 R, CM  10 R, CM   ?Calcium 8.7 - 10.3 mg/dL 9.9  9.7  9.8  9.1 R  9.4 R  9.2 R  9.2 R   ?Resulting Agency  [facility]  ? ? ?Patient currently on torsemide 40 mg daily, may take an extra 40 mg as needed for swelling/SOB.  ? ?Attempted to call and speak with patient. One number did not go through. Other number rang but did not go to voicemail. Number that was listed that patient called from was wrong number. Will try to call patient later.  ?

## 2021-07-26 NOTE — Progress Notes (Signed)
Internal Medicine Clinic Attending  Case discussed with Dr. Carter  At the time of the visit.  We reviewed the resident's history and exam and pertinent patient test results.  I agree with the assessment, diagnosis, and plan of care documented in the resident's note.  

## 2021-07-27 NOTE — Telephone Encounter (Signed)
Attempted to call patient. No answer, unable to leave message.

## 2021-07-28 NOTE — Telephone Encounter (Signed)
Attempted to call patient to go over concerns. No answer, no voicemail.  ?

## 2021-08-09 ENCOUNTER — Telehealth: Payer: Self-pay | Admitting: Cardiovascular Disease

## 2021-08-09 NOTE — Telephone Encounter (Signed)
Spoke with patient and reviewed Dr. Donivan Scull recommendations to alternate Torsemide 20 mg with Torsemide 40 mg. Patient states she was only taking 1 tablet daily. Advised I would update provider and we would be in touch if he has any further recommendations.  ? ? ?Dana Merritts, MD  ?SentUlla [name]  2:56 PM  ? ? ?Message ? ?Notes indicating she is taking torsemide 40 daily  ?Would probably recommend we decrease torsemide 40 daily alternating with 20 daily  ?Could be getting a little bit dry  ?We can probably recheck it when she comes into clinic in 1 month  ?Thx  ?TG  ? ?

## 2021-08-09 NOTE — Telephone Encounter (Signed)
Patient states she had some labs done at her PCP and they found something in her kidney's. She says this ha happened in the past and was taken off some medications. She would like to know if anything needs to be adjusted again. ?

## 2021-08-09 NOTE — Telephone Encounter (Signed)
Patient called in stating that she had some labs done at her primary care provider that showed some elevation in her kidney numbers. They recommended she call to see if any medication adjustments are needed based on these most recent labs. Advised I would forward to provider for review and we will be in touch with any recommendations. She verbalized understanding with no further questions at this time.  ? ?She was overdue for follow up. Last seen 03/08/21 and she was to follow up in 1 month. Scheduled her to come in to see Dr. Rockey Situ 09/13/21.  ? ? ?Recent Results (from the past 2160 hour(s))  ?BMP8+Anion Gap     Status: Abnormal  ? Collection Time: 07/22/21  3:42 PM  ?Result Value Ref Range  ? Glucose 86 70 - 99 mg/dL  ? BUN 20 8 - 27 mg/dL  ? Creatinine, Ser 1.52 (H) 0.57 - 1.00 mg/dL  ? eGFR 35 (L) >59 mL/min/1.73  ? BUN/Creatinine Ratio 13 12 - 28  ? Sodium 142 134 - 144 mmol/L  ? Potassium 4.9 3.5 - 5.2 mmol/L  ? Chloride 98 96 - 106 mmol/L  ? CO2 27 20 - 29 mmol/L  ? Anion Gap 17.0 10.0 - 18.0 mmol/L  ? Calcium 9.9 8.7 - 10.3 mg/dL  ? ? ? ?  ?

## 2021-08-10 MED ORDER — TORSEMIDE 20 MG PO TABS: 20.0000 mg | ORAL_TABLET | ORAL | 3 refills | Status: DC

## 2021-08-10 NOTE — Telephone Encounter (Signed)
Dana Merritts, MD  You 14 hours ago (5:31 PM)  ? ?We will probably need to decrease torsemide 20 daily down to 4 days a week given climbing creatinine  ?Could try Monday, Wednesday Friday Saturday  ?Thx  ?TGollan   ? ?Spoke with patient. Went over reccs. Pt will:  ? ?- Decrease torsemide to 20 mg daily 4 days a week on Mon, Wed, Fri, Sat ?- Follow up as scheduled on 5/15 ? ?Pt verbalized understanding and voiced appreciation for the call. ?

## 2021-08-18 ENCOUNTER — Other Ambulatory Visit (HOSPITAL_COMMUNITY): Payer: Self-pay

## 2021-08-19 ENCOUNTER — Encounter: Payer: Self-pay | Admitting: Student

## 2021-08-19 ENCOUNTER — Ambulatory Visit (INDEPENDENT_AMBULATORY_CARE_PROVIDER_SITE_OTHER): Payer: Medicare HMO | Admitting: Student

## 2021-08-19 VITALS — BP 118/57 | HR 69 | Temp 97.7°F | Ht 60.0 in | Wt 157.9 lb

## 2021-08-19 DIAGNOSIS — E2839 Other primary ovarian failure: Secondary | ICD-10-CM | POA: Diagnosis not present

## 2021-08-19 DIAGNOSIS — N179 Acute kidney failure, unspecified: Secondary | ICD-10-CM | POA: Diagnosis not present

## 2021-08-19 DIAGNOSIS — Z1159 Encounter for screening for other viral diseases: Secondary | ICD-10-CM

## 2021-08-19 DIAGNOSIS — I471 Supraventricular tachycardia: Secondary | ICD-10-CM | POA: Diagnosis not present

## 2021-08-19 DIAGNOSIS — I5042 Chronic combined systolic (congestive) and diastolic (congestive) heart failure: Secondary | ICD-10-CM | POA: Diagnosis not present

## 2021-08-19 DIAGNOSIS — Z0000 Encounter for general adult medical examination without abnormal findings: Secondary | ICD-10-CM

## 2021-08-19 DIAGNOSIS — I25118 Atherosclerotic heart disease of native coronary artery with other forms of angina pectoris: Secondary | ICD-10-CM | POA: Diagnosis not present

## 2021-08-19 DIAGNOSIS — I5022 Chronic systolic (congestive) heart failure: Secondary | ICD-10-CM | POA: Diagnosis not present

## 2021-08-19 NOTE — Assessment & Plan Note (Addendum)
-   Hep C screening => negative ?-DEXA scan ordered ?-Unfortunately Tdap is not covered under Medicare here.  Advised patient to go to any pharmacy for it. ?-She declined pneumonia shot ?

## 2021-08-19 NOTE — Assessment & Plan Note (Addendum)
Continue aspirin and atorvastatin 80 mg ? ?-Repeat lipid panel.  Last LDL 124 ? ?Addendum ?LDL at goal ?

## 2021-08-19 NOTE — Assessment & Plan Note (Addendum)
EF 10-15% on echocardiogram 9/22.  History of CAD status post CABG. ? ?Current regimen include Coreg, Imdur, hydralazine and torsemide.  [name] was held due to AKI.  Dr. Mariah Milling of cardiology recommended reduce torsemide to 20 mg every other day. ? ?Patient reports doing well.  Denies shortness of breath or lower extremity edema.  Her weight has been stable since last visit.  Physical exam was negative for LE edema, JVD or crackles. ? ?-Repeat BMP and magnesium today ?-Continue torsemide 20 mg every other day ?-Continue Coreg, Imdur, hydralazine ?-If kidney function improves, will resume Entresto and follow-up in 2 weeks for BMP. ?-Follow-up with cardiology on May 15 and EP on May 3 for AICD placement. ? ?Addendum ?Repeat BMP showed slightly worsened kidney function.  GFR 33 now.  Mag within normal limits.  Advised patient to continue holding Entresto until she sees her cardiologist in 2 weeks.  I also reached out to Dr. Mariah Milling about the BMP result.  Patient verbalizes understanding. ?

## 2021-08-19 NOTE — Patient Instructions (Signed)
Dana [name], ? ?It was nice seeing you in the clinic today. ? ?1.  We will recheck your kidney function today.  Please continue torsemide every other day.  If your kidney function returns normal, I will call you and ask you to resume Entresto. ? ?Please follow-up with your cardiologist as scheduled. ? ?2.  I will check a lipid panel today. ? ?3.  I ordered a bone scan. ? ?Please return in 2 weeks ? ?Dr. Cyndie Chime ?

## 2021-08-19 NOTE — Progress Notes (Addendum)
? ?CC: 4 weeks follow-up for BMP ? ?HPI: ? ?Dana [name] is a 76 y.o. with past medical history of HFrEF, CAD, COPD who presents to the clinic for 4 weeks follow-up due to AKI. ? ?Please see problem based charting for detail ? ?Past Medical History:  ?Diagnosis Date  ? CHF (congestive heart failure) (HCC)   ? Complication of anesthesia   ? "difficulty waking up, it was all day long"  ? Coronary artery disease   ? Hyperlipidemia   ? LBBB (left bundle branch block)   ? Psoriasis   ? ?Review of Systems: Per HPI ? ?Physical Exam: ? ?Vitals:  ? 08/19/21 1552  ?BP: (!) 118/57  ?Pulse: 69  ?Temp: 97.7 ?F (36.5 ?C)  ?TempSrc: Oral  ?SpO2: 94%  ?Weight: 157 lb 14.4 oz (71.6 kg)  ?Height: 5' (1.524 m)  ? ?Physical Exam ?Constitutional:   ?   General: She is not in acute distress. ?   Appearance: She is not ill-appearing.  ?HENT:  ?   Head: Normocephalic.  ?Eyes:  ?   General:     ?   Right eye: No discharge.     ?   Left eye: No discharge.  ?   Conjunctiva/sclera: Conjunctivae normal.  ?Cardiovascular:  ?   Rate and Rhythm: Normal rate and regular rhythm.  ?   Comments: No LE edema.  No JVD ?Pulmonary:  ?   Effort: Pulmonary effort is normal. No respiratory distress.  ?   Breath sounds: Normal breath sounds. No wheezing.  ?Musculoskeletal:     ?   General: Normal range of motion.  ?Skin: ?   General: Skin is warm.  ?   Comments: Multiple psoriatic plaques seen bilateral lower and upper extremities  ?Neurological:  ?   General: No focal deficit present.  ?   Mental Status: She is alert.  ?Psychiatric:     ?   Mood and Affect: Mood normal.  ?  ? ?Assessment & Plan:  ? ?See Encounters Tab for problem based charting. ? ?Combined systolic and diastolic congestive heart failure (HCC) ?EF 10-15% on echocardiogram 9/22.  History of CAD status post CABG. ? ?Current regimen include Coreg, Imdur, hydralazine and torsemide.  [name] was held due to AKI.  Dr. Mariah Milling of cardiology recommended reduce torsemide to 20 mg every other  day. ? ?Patient reports doing well.  Denies shortness of breath or lower extremity edema.  Her weight has been stable since last visit.  Physical exam was negative for LE edema, JVD or crackles. ? ?-Repeat BMP and magnesium today ?-Continue torsemide 20 mg every other day ?-Continue Coreg, Imdur, hydralazine ?-If kidney function improves, will resume Entresto and follow-up in 2 weeks for BMP. ?-Follow-up with cardiology on May 15 and EP on May 3 for AICD placement. ? ?Addendum ?Repeat BMP showed slightly worsened kidney function.  GFR 33 now.  Mag within normal limits.  Advised patient to continue holding Entresto until she sees her cardiologist in 2 weeks.  I also reached out to Dr. Mariah Milling about the BMP result.  Patient verbalizes understanding. ? ?Coronary artery disease of native artery of native heart with stable angina pectoris (HCC) ?Continue aspirin and atorvastatin 80 mg ? ?-Repeat lipid panel.  Last LDL 124 ? ?Addendum ?LDL at goal ? ?Atrial tachycardia (HCC) ?Heart rate is well controlled.  She is current taking amiodarone 200 mg daily. ? ?Healthcare maintenance ?- Hep C screening => negative ?-DEXA scan ordered ?-Unfortunately Tdap is not covered under  Medicare here.  Advised patient to go to any pharmacy for it. ?-She declined pneumonia shot  ? ?Patient discussed with Dr. Heide Spark  ?

## 2021-08-19 NOTE — Assessment & Plan Note (Signed)
Heart rate is well controlled.  She is current taking amiodarone 200 mg daily. ?

## 2021-08-20 LAB — LIPID PANEL
Chol/HDL Ratio: 2.4 ratio (ref 0.0–4.4)
Cholesterol, Total: 151 mg/dL (ref 100–199)
HDL: 64 mg/dL (ref >39)
LDL Chol Calc (NIH): 52 mg/dL (ref 0–99)
Triglycerides: 222 mg/dL — ABNORMAL HIGH (ref 0–149)
VLDL Cholesterol Cal: 35 mg/dL (ref 5–40)

## 2021-08-20 LAB — BMP8+ANION GAP
Anion Gap: 18 mmol/L (ref 10.0–18.0)
BUN/Creatinine Ratio: 19 (ref 12–28)
BUN: 30 mg/dL — ABNORMAL HIGH (ref 8–27)
CO2: 25 mmol/L (ref 20–29)
Calcium: 9.5 mg/dL (ref 8.7–10.3)
Chloride: 101 mmol/L (ref 96–106)
Creatinine, Ser: 1.61 mg/dL — ABNORMAL HIGH (ref 0.57–1.00)
Glucose: 96 mg/dL (ref 70–99)
Potassium: 4.3 mmol/L (ref 3.5–5.2)
Sodium: 144 mmol/L (ref 134–144)
eGFR: 33 mL/min/{1.73_m2} — ABNORMAL LOW (ref >59)

## 2021-08-20 LAB — HEPATITIS C ANTIBODY: Hep C Virus Ab: NONREACTIVE

## 2021-08-20 LAB — MAGNESIUM: Magnesium: 2.3 mg/dL (ref 1.6–2.3)

## 2021-08-20 NOTE — Progress Notes (Signed)
Internal Medicine Clinic Attending  Case discussed with Dr. Nguyen  At the time of the visit.  We reviewed the resident's history and exam and pertinent patient test results.  I agree with the assessment, diagnosis, and plan of care documented in the resident's note. 

## 2021-08-29 NOTE — Progress Notes (Signed)
?Electrophysiology Office Note:   ? ?Date:  09/01/2021  ? ?ID:  Swathi Socks, DOB 12/26/1945, MRN [id number] ? ?PCP:  Rick Duff, MD  ?[facility] Cardiologist:  None  ?[facility] Electrophysiologist:  Vickie Epley, MD  ? ?Referring MD: Marrianne Mood D, PA*  ? ?Chief Complaint: chronic HF ? ?History of Present Illness:   ? ?Dana [name] is a 76 y.o. female who presents for an evaluation of chronic HF at the request of Dr Rockey Situ. Their medical history includes chronic systolic heart failure, left bundle branch block, coronary artery disease.  Patient has a history of CABG in 2013.  During that procedure she actually had an epicardial left ventricular lead placed but the patient was subsequently lost to follow-up. ? ?Today she tells me she is doing okay.  Has some fatigue.  No syncope.  She is "ready" to proceed with ICD implant.  No problems with her medications. ? ? ?  ?Past Medical History:  ?Diagnosis Date  ? CHF (congestive heart failure) ([place])   ? Complication of anesthesia   ? "difficulty waking up, it was all day long"  ? Coronary artery disease   ? Hyperlipidemia   ? LBBB (left bundle branch block)   ? Psoriasis   ? ? ?Past Surgical History:  ?Procedure Laterality Date  ? CARDIAC CATHETERIZATION  02/17/2012  ? CORONARY ARTERY BYPASS GRAFT  02/23/2012  ? Procedure: CORONARY ARTERY BYPASS GRAFTING (CABG);  Surgeon: Melrose Nakayama, MD;  Location: [facility];  Service: Open Heart Surgery;  Laterality: N/A;  coronary artery bypass graft on pump times two utlizing left internal mammary artery and right greater saphenous vein harvested endoscopically, transesophageal echocardiogram   ? NO PAST SURGERIES    ? RIGHT/LEFT HEART CATH AND CORONARY/GRAFT ANGIOGRAPHY N/A 07/30/2018  ? Procedure: RIGHT/LEFT HEART CATH AND CORONARY/GRAFT ANGIOGRAPHY;  Surgeon: Wellington Hampshire, MD;  Location: [facility];  Service: Cardiovascular;  Laterality: N/A;  ? TEE WITHOUT CARDIOVERSION   02/22/2012  ? Procedure: TRANSESOPHAGEAL ECHOCARDIOGRAM (TEE);  Surgeon: Jolaine Artist, MD;  Location[facility];  Service: Cardiovascular;  Laterality: N/A;  ? ? ?Current Medications: ?Current Meds  ?Medication Sig  ? acetaminophen (TYLENOL) 325 MG tablet Take 2 tablets (650 mg total) by mouth every 4 (four) hours as needed for headache or mild pain.  ? albuterol (VENTOLIN HFA) 108 (90 Base) MCG/ACT inhaler TAKE 2 PUFFS BY MOUTH EVERY 6 HOURS AS NEEDED FOR WHEEZE OR SHORTNESS OF BREATH  ? amiodarone (PACERONE) 200 MG tablet Take 1 tablet (200 mg total) by mouth daily.  ? aspirin 81 MG chewable tablet Chew 1 tablet (81 mg total) by mouth daily.  ? atorvastatin (LIPITOR) 80 MG tablet TAKE 1 TABLET BY MOUTH DAILY  ? carvedilol (COREG) 3.125 MG tablet Take 1 tablet (3.125 mg total) by mouth 2 (two) times daily with a meal.  ? empagliflozin (JARDIANCE) 10 MG TABS tablet Take 1 tablet (10 mg total) by mouth daily.  ? hydrALAZINE (APRESOLINE) 25 MG tablet Take 1 tablet (25 mg total) by mouth every 8 (eight) hours.  ? isosorbide mononitrate (IMDUR) 30 MG 24 hr tablet Take 1 tablet (30 mg total) by mouth daily.  ? nicotine (NICODERM CQ - DOSED IN MG/24 HOURS) 21 mg/24hr patch Place 1 patch (21 mg total) onto the skin daily.  ? nicotine polacrilex (NICORETTE) 4 MG gum Take 1 each (4 mg total) by mouth as needed for smoking cessation.  ? sacubitril-valsartan (ENTRESTO) 24-26 MG Take 1 tablet  by mouth 2 (two) times daily.  ? Tiotropium Bromide-Olodaterol (STIOLTO RESPIMAT) 2.5-2.5 MCG/ACT AERS Inhale 2 puffs into the lungs daily.  ? torsemide (DEMADEX) 20 MG tablet Take 1 tablet (20 mg total) by mouth 4 (four) times a week. Take Monday, Wednesday, Friday, Saturday.  ?  ? ?Allergies:   Bevespi aerosphere [glycopyrrolate-formoterol]  ? ?Social History  ? ?Socioeconomic History  ? Marital status: Divorced  ?  Spouse name: Not on file  ? Number of children: Not on file  ? Years of education: Not on file  ? Highest  education level: Not on file  ?Occupational History  ? Not on file  ?Tobacco Use  ? Smoking status: Former  ?  Types: Cigarettes  ?  Quit date: 11/30/2020  ?  Years since quitting: 0.7  ? Smokeless tobacco: Never  ? Tobacco comments:  ?  Smoked but cut down when started to get sick  ?Substance and Sexual Activity  ? Alcohol use: No  ? Drug use: No  ? Sexual activity: Not on file  ?Other Topics Concern  ? Not on file  ?Social History Narrative  ? Not on file  ? ?Social Determinants of Health  ? ?Financial Resource Strain: Not on file  ?Food Insecurity: Not on file  ?Transportation Needs: Not on file  ?Physical Activity: Not on file  ?Stress: Not on file  ?Social Connections: Not on file  ?  ? ?Family History: ?The patient's family history includes Heart disease in her father. ? ?ROS:   ?Please see the history of present illness.    ?All other systems reviewed and are negative. ? ?EKGs/Labs/Other Studies Reviewed:   ? ?The following studies were reviewed today: ? ? ?January 16, 2021 chest x-ray personally reviewed ?Epicardial lead on lateral left ventricular wall in good position fluoroscopically.  The lead is tunneled to the left chest.  There is no pulse generator in place.  Heart is enlarged.  Evidence of median sternotomy ? ? ?January 17, 2021 echo ?Left ventricular function 10 to 15% ?Severely dilated left ventricle ?Severely reduced right ventricular function ?Mildly dilated left atrium ?Severe MR ? ?February 05, 2021 EKG shows wide left bundle, nearly 180-200 ms ? ? ? ? ?Recent Labs: ?01/16/2021: B Natriuretic Peptide 2,682.2 ?02/05/2021: ALT 20; Hemoglobin 13.8; Platelets 398; TSH 13.600 ?08/19/2021: BUN 30; Creatinine, Ser 1.61; Magnesium 2.3; Potassium 4.3; Sodium 144  ?Recent Lipid Panel ?   ?Component Value Date/Time  ? CHOL 151 08/19/2021 1639  ? CHOL 123 02/17/2012 0314  ? TRIG 222 (H) 08/19/2021 1639  ? TRIG 99 02/17/2012 0314  ? HDL 64 08/19/2021 1639  ? HDL 30 (L) 02/17/2012 0314  ? CHOLHDL 2.4  08/19/2021 1639  ? CHOLHDL 3.2 07/27/2018 0417  ? VLDL 20 07/27/2018 0417  ? VLDL 20 02/17/2012 0314  ? [place] 52 08/19/2021 1639  ? [place] 73 02/17/2012 0314  ? LDLDIRECT 124 (H) 02/05/2021 1256  ? ? ?Physical Exam:   ? ?VS:  BP 132/64   Pulse 65   Ht 5' (1.524 m)   Wt 157 lb (71.2 kg)   SpO2 96%   BMI 30.66 kg/m?    ? ?Wt Readings from Last 3 Encounters:  ?09/01/21 157 lb (71.2 kg)  ?08/19/21 157 lb 14.4 oz (71.6 kg)  ?07/22/21 157 lb 9.6 oz (71.5 kg)  ?  ? ?GEN:  Well nourished, well developed in no acute distress ?HEENT: Normal ?NECK: No JVD; No carotid bruits ?LYMPHATICS: No lymphadenopathy ?CARDIAC: RRR, no murmurs, rubs,  gallops  ?RESPIRATORY:  Clear to auscultation without rales, wheezing or rhonchi  ?ABDOMEN: Soft, non-tender, non-distended ?MUSCULOSKELETAL:  No edema; No deformity  ?SKIN: Warm and dry ?NEUROLOGIC:  Alert and oriented x 3 ?PSYCHIATRIC:  Normal affect  ? ? ?  ? ?ASSESSMENT:   ? ?1. Coronary artery disease of native artery of native heart with stable angina pectoris ([place])   ?2. Left bundle branch block   ?3. Chronic combined systolic and diastolic congestive heart failure ([place])   ? ?PLAN:   ? ?In order of problems listed above: ? ? ?#Chronic systolic and diastolic heart failure ?NYHA class III.  Warm and dry on exam.  On good medical therapy with Coreg, Jardiance, hydralazine, Imdur, Entresto and torsemide. ?Has a left bundle branch block that is extremely wide.  His epicardial left ventricular lead is never been attached to a pulse generator for CRT. ? ?I have recommended CRT-D implant.  We will plan to implant a new right atrial and right ventricular lead (single coil).  We will attach these 2 leads to the pulse generator along with the epicardial LV lead as long as the electrical parameters are stable on that lead.  If the epicardial LV lead does not show good electrical parameters, would plan to implant a new CS lead at the time of CRT-D implant. ? ?I discussed the procedure in  detail the patient including the risk, recovery and she wishes to proceed.  ? ?The patient has an ischemic CM (EF 15%), NYHA Class III CHF, and CAD.  They are referred by Dr Rockey Situ for risk stratification of sudden death and consider

## 2021-09-01 ENCOUNTER — Ambulatory Visit (INDEPENDENT_AMBULATORY_CARE_PROVIDER_SITE_OTHER): Payer: Medicare HMO | Admitting: Cardiology

## 2021-09-01 ENCOUNTER — Encounter: Payer: Self-pay | Admitting: Cardiology

## 2021-09-01 ENCOUNTER — Encounter: Payer: Self-pay | Admitting: *Deleted

## 2021-09-01 VITALS — BP 132/64 | HR 65 | Ht 60.0 in | Wt 157.0 lb

## 2021-09-01 DIAGNOSIS — I25118 Atherosclerotic heart disease of native coronary artery with other forms of angina pectoris: Secondary | ICD-10-CM

## 2021-09-01 DIAGNOSIS — I447 Left bundle-branch block, unspecified: Secondary | ICD-10-CM | POA: Diagnosis not present

## 2021-09-01 DIAGNOSIS — I5042 Chronic combined systolic (congestive) and diastolic (congestive) heart failure: Secondary | ICD-10-CM

## 2021-09-01 NOTE — Patient Instructions (Addendum)
Medications: ?Your physician recommends that you continue on your current medications as directed. Please refer to the Current Medication list given to you today. ?*If you need a refill on your cardiac medications before your next appointment, please call your pharmacy* ? ?Lab Work: ?None. ?If you have labs (blood work) drawn today and your tests are completely normal, you will receive your results only by: ?MyChart Message (if you have MyChart) OR ?A paper copy in the mail ?If you have any lab test that is abnormal or we need to change your treatment, we will call you to review the results. ? ?Testing/Procedures: ?Your physician has recommended that you have a defibrillator inserted. An implantable cardioverter defibrillator (ICD) is a small device that is placed in your chest or, in rare cases, your abdomen. This device uses electrical pulses or shocks to help control life-threatening, irregular heartbeats that could lead the heart to suddenly stop beating (sudden cardiac arrest). Leads are attached to the ICD that goes into your heart. This is done in the hospital and usually requires an overnight stay. Please see the instruction sheet given to you today for more information.  ? ?Follow-Up: ?At [facility], you and your health needs are our priority.  As part of our continuing mission to provide you with exceptional heart care, we have created designated Provider Care Teams.  These Care Teams include your primary Cardiologist (physician) and Advanced Practice Providers (APPs -  Physician Assistants and Nurse Practitioners) who all work together to provide you with the care you need, when you need it. ? ?Your physician wants you to follow-up in: -  Device implant scheduled for June 6.  ?You will follow up with the [place] clinic 10-14 days after your procedure. ?- You will follow up with Dr. Quentin Ore 91 days after your procedure.  ?  ? ?We recommend signing up for the patient portal called "MyChart".   Sign up information is provided on this After Visit Summary.  MyChart is used to connect with patients for Virtual Visits (Telemedicine).  Patients are able to view lab/test results, encounter notes, upcoming appointments, etc.  Non-urgent messages can be sent to your provider as well.   ?To learn more about what you can do with MyChart, go to [email or web address].   ? ?Any Other Special Instructions Will Be Listed Below (If Applicable).  ? ?Cardioverter Defibrillator Implantation ?An implantable cardioverter defibrillator (ICD) is a device that identifies and corrects abnormal heart rhythms. Cardioverter defibrillator implantation is a surgery to place an ICD under the skin in the chest or abdomen. An ICD has a battery, a small computer (pulse generator), and wires (leads) that go into the heart. The ICD detects and corrects two types of dangerous irregular heart rhythms (arrhythmias): ?A rapid heart rhythm in the lower chambers of the heart (ventricles). This is called ventricular tachycardia. ?The ventricles contracting in an uncoordinated way. This is called ventricular fibrillation. ?There are different types of ICDs, and the electrical signals from the ICD can be programmed differently based on the condition being treated. The electrical signals from the ICD can be low-energy pulses, high-energy shocks, or a combination of the two. The low-energy pulses are generally used to restore the heartbeat to normal when it is either too slow (bradycardia) or too fast. These pulses are painless. The high-energy shocks are used to treat abnormal rhythms such as ventricular tachycardia or ventricular fibrillation. This shock may feel like a strong jolt in the chest. ?Your health care provider may  recommend an ICD if you have: ?Had a ventricular arrhythmia in the past. ?A damaged heart because of a disease or heart condition. ?A weakened heart muscle from a heart attack or cardiac arrest. ?A congenital heart  defect. ?Long QT syndrome, which is a disorder of the heart's electrical system. ?Brugada syndrome, which is a condition that causes a disruption of the heart's normal rhythm. ?Tell a health care provider about: ?Any allergies you have. ?All medicines you are taking, including vitamins, herbs, eye drops, creams, and over-the-counter medicines. ?Any problems you or family members have had with anesthetic medicines. ?Any blood disorders you have. ?Any surgeries you have had. ?Any medical conditions you have. ?Whether you are pregnant or may be pregnant. ?What are the risks? ?Generally, this is a safe procedure. However, problems may occur, including: ?Infection. ?Bleeding. ?Allergic reactions to medicines used during the procedure. ?Blood clots. ?Swelling or bruising. ?Damage to nearby structures or organs, such as nerves, lungs, blood vessels, or the heart where the ICD leads or pulse generator is implanted. ?What happens before the procedure? ?Staying hydrated ?Follow instructions from your health care provider about hydration, which may include: ?Up to 2 hours before the procedure - you may continue to drink clear liquids, such as water, clear fruit juice, black coffee, and plain tea. ? ?Eating and drinking restrictions ?Follow instructions from your health care provider about eating and drinking, which may include: ?8 hours before the procedure - stop eating heavy meals or foods, such as meat, fried foods, or fatty foods. ?6 hours before the procedure - stop eating light meals or foods, such as toast or cereal. ?6 hours before the procedure - stop drinking milk or drinks that contain milk. ?2 hours before the procedure - stop drinking clear liquids. ?Medicines ?Ask your health care provider about: ?Changing or stopping your regular medicines. This is especially important if you are taking diabetes medicines or blood thinners. ?Taking medicines such as aspirin and ibuprofen. These medicines can thin your blood. Do  not take these medicines unless your health care provider tells you to take them. ?Taking over-the-counter medicines, vitamins, herbs, and supplements. ?Tests ?You may have an exam or testing. These may include: ?Blood tests. ?A test to check the electrical signals in your heart (electrocardiogram, ECG). ?Imaging tests, such as a chest X-ray. ?Echocardiogram. This is an ultrasound of your heart to evaluate your heart structures and function. ?An event monitor or Holter monitor to wear at home. ?General instructions ?Do not use any products that contain nicotine or tobacco for at least 4 weeks before the procedure. These products include cigarettes, chewing tobacco, and vaping devices, such as e-cigarettes. If you need help quitting, ask your health care provider. ?Ask your health care provider: ?How your procedure site will be marked. ?What steps will be taken to help prevent infection. These may include: ?Removing hair at the surgery site. ?Washing skin with a germ-killing soap. ?Taking antibiotic medicine. ?You may be asked to shower with a germ-killing soap. ?Plan to have a responsible adult take you home from the hospital or clinic. ?What happens during the procedure? ? ?Small monitors will be put on your body. They will be used to check your heart rate, blood pressure, and oxygen level. ?A pair of sticky pads (defibrillator pads) may be placed on your back and chest. These pads are able to pace your heart as needed during the procedure. ?An IV will be inserted into one of your veins. ?You will be given one or more  of the following: ?A medicine to help you relax (sedative). ?A medicine to numb the area (local anesthetic). ?A medicine to make you fall asleep(general anesthetic). ?A small incision will be made to create a deep pocket under the skin of your chest or abdomen. ?Leads will be guided through a blood vessel into your heart and attached to your heart muscles. Depending on the ICD, the leads may go into  one ventricle, or they may go into both ventricles and into an upper chamber of the heart. An X-ray machine (fluoroscope) will be used to help guide the leads. The other end of the leads will be attached to the pu

## 2021-09-08 NOTE — Progress Notes (Signed)
?  ?  ? ? ? ?Evaluation Performed:  Follow-up visit ? ?Date:  09/13/2021  ? ?ID:  Dana [name], DOB June 03, 1945, MRN [id number] ? ?Patient Location:  ?[address] ?[place] [id number]  ? ?Provider location:   ?[place], [employer] office ? ?PCP:  Rick Duff, MD  ?Cardiologist:  [facility] ? ?Chief Complaint  ?Patient presents with  ? 1 month past due follow up   ?  "Doing well." Medications reviewed by the patient verbally.   ? ? ? ?History of Present Illness:   ? ?Dana [name] is a 76 y.o. female  past medical history of ?smoking, anxiety attacks ,  ?[facility] in October 0000000 with systolic CHF, ejection fraction 25% ?severe left ventricular dysfunction with an EF of 15% on echocardiogram,  ?cardiac catheterization showing severe proximal LAD and ostial diagonal disease, moderate proximal left circumflex disease,  transferred to [facility], had coronary bypass grafting x2 on 02/23/2012  with  epicardial pacemaker lead on the posterior lateral wall  ?Lost to follow-up/medication noncompliance ?In the hospital March 2020 exacerbation of her systolic CHF ?Catheterization March 2020 ?Still smoking, "very rare" ?Who f/u for acute on chronic diastolic and systolic CHF ? ?Last seen by myself in person in 11/22 ?No recent hospitalizations ?Scheduled for ICD upgrade in June 2023 ? ?In follow-up today reports doing well ?No regular exercise program, limited by leg and back pain ? ?Lab work reviewed ?Total chol 151, LDL 52 ?CR 1.6 ? ?Echo 9/22 ?Left ventricular ejection fraction, by estimation, is 10-15%. ?Prior to that was <20% in 06/2020 ? ?Reviewed medications with her ?On entresto, torsemide, coreg, imdur, jardiance, hydralazine  ?Denies any significant side effects or intolerances ?Blood pressure stable ? ?EKG personally reviewed by myself on todays visit ?Nsr rate 63 bpm lbbb  ? ?Other past medical history reviewed ?Cath 07/2018, reviewed ?Mid LM to Dist LM lesion is 20%  stenosed. ?Prox LAD lesion is 80% stenosed. ?Ost 1st Diag lesion is 80% stenosed. ?Mid Cx lesion is 80% stenosed. ?SVG and is normal in caliber. ?The graft exhibits no disease. ?LIMA and is normal in caliber. ?The graft exhibits no disease. ?  ?1.  Significant two-vessel coronary artery disease involving proximal LAD at the bifurcation of first diagonal as well as mid left circumflex.  Patent grafts including LIMA to LAD and SVG to diagonal.  The LAD gets mostly antegrade flow but in spite of that the LIMA is not atretic.  Left circumflex distribution area is medium in size. ?2.  Right heart catheterization showed mildly elevated filling pressures with pulmonary wedge pressure of 13 mmHg, mild pulmonary hypertension at 38 over 40 mmHg and severely reduced cardiac output at 3.13 with an index of 1.81. ?  ?No need to revascularize the LAD given that the LIMA is patent.  PCI of the left circumflex is possible but I doubt it will make significant difference on ejection fraction.  This should be reserved for refractory angina. ? ?Discharge from the hospital July 30, 2018 for acute on chronic diastolic and systolic CHF, COPD exacerbation ? ? ?Echocardiogram 02/17/2012 showed ejection fraction less than 25%, severe global hypokinesis ? ?Cardiac cath 02/17/2012 showing severe proximal LAD and ostial diagonal disease, bifurcation disease. Estimated at 95%. Moderate proximal left circumflex disease, small to moderate size vessel. Severely depressed ejection fraction estimated at 25%. ? ? ?Prior CV studies:   ?The following studies were reviewed today: ? ?TTE ?07/26/2018 ?1. The left ventricle has severely reduced systolic  function, with an ejection fraction < 20%. The cavity size was severely dilated. Global hypokinesis, Concern for severe hypokinesis/possible akinesis of the anterior/anterseptal wall/apical. Unable to  ?determine diastolic parameters ? 2. The right ventricle has mildly reduced systolic function. The cavity  was normal. There is no increase in right ventricular wall thickness. Right ventricular systolic pressure is mildly elevated with an estimated pressure of 37.7 mmHg. ? 3. The aortic valve is grossly normal Moderate calcification of the aortic valve. Aortic valve regurgitation was not assessed by color flow Doppler. ? 4. Mitral valve regurgitation is moderate ? 5. Left atrial size was moderately dilated. ?  ? ?Cath 07/30/2018 ?Mid LM to Dist LM lesion is 20% stenosed. ?Prox LAD lesion is 80% stenosed. ?Ost 1st Diag lesion is 80% stenosed. ?Mid Cx lesion is 80% stenosed. ?SVG and is normal in caliber. ?The graft exhibits no disease. ?LIMA and is normal in caliber. ?The graft exhibits no disease. ?  ? ?1.  Significant two-vessel coronary artery disease involving proximal LAD at the bifurcation of first diagonal as well as mid left circumflex.  Patent grafts including LIMA to LAD and SVG to diagonal.  The LAD gets mostly antegrade flow but in spite of that the LIMA is not atretic.  Left circumflex distribution area is medium in size. ?2.  Right heart catheterization showed mildly elevated filling pressures with pulmonary wedge pressure of 13 mmHg, mild pulmonary hypertension at 38 over 40 mmHg and severely reduced cardiac output at 3.13 with an index of 1.81. ?  ?Recommendations: ?No need to revascularize the LAD given that the LIMA is patent.   ?PCI of the left circumflex is possible but I doubt it will make significant difference on ejection fraction.  This should be reserved for refractory angina. ? ?Past Medical History:  ?Diagnosis Date  ? CHF (congestive heart failure) ([place])   ? Complication of anesthesia   ? "difficulty waking up, it was all day long"  ? Coronary artery disease   ? Hyperlipidemia   ? LBBB (left bundle branch block)   ? Psoriasis   ? ?Past Surgical History:  ?Procedure Laterality Date  ? CARDIAC CATHETERIZATION  02/17/2012  ? CORONARY ARTERY BYPASS GRAFT  02/23/2012  ? Procedure: CORONARY ARTERY  BYPASS GRAFTING (CABG);  Surgeon: Melrose Nakayama, MD;  Location: [facility];  Service: Open Heart Surgery;  Laterality: N/A;  coronary artery bypass graft on pump times two utlizing left internal mammary artery and right greater saphenous vein harvested endoscopically, transesophageal echocardiogram   ? NO PAST SURGERIES    ? RIGHT/LEFT HEART CATH AND CORONARY/GRAFT ANGIOGRAPHY N/A 07/30/2018  ? Procedure: RIGHT/LEFT HEART CATH AND CORONARY/GRAFT ANGIOGRAPHY;  Surgeon: Wellington Hampshire, MD;  Location: [facility];  Service: Cardiovascular;  Laterality: N/A;  ? TEE WITHOUT CARDIOVERSION  02/22/2012  ? Procedure: TRANSESOPHAGEAL ECHOCARDIOGRAM (TEE);  Surgeon: Jolaine Artist, MD;  [facility];  Service: Cardiovascular;  Laterality: N/A;  ?  ? ?Current Meds  ?Medication Sig  ? acetaminophen (TYLENOL) 325 MG tablet Take 2 tablets (650 mg total) by mouth every 4 (four) hours as needed for headache or mild pain.  ? albuterol (VENTOLIN HFA) 108 (90 Base) MCG/ACT inhaler TAKE 2 PUFFS BY MOUTH EVERY 6 HOURS AS NEEDED FOR WHEEZE OR SHORTNESS OF BREATH  ? amiodarone (PACERONE) 200 MG tablet Take 1 tablet (200 mg total) by mouth daily.  ? aspirin 81 MG chewable tablet Chew 1 tablet (81 mg total) by mouth daily.  ? atorvastatin (  LIPITOR) 80 MG tablet TAKE 1 TABLET BY MOUTH DAILY  ? carvedilol (COREG) 3.125 MG tablet Take 1 tablet (3.125 mg total) by mouth 2 (two) times daily with a meal.  ? empagliflozin (JARDIANCE) 10 MG TABS tablet Take 1 tablet (10 mg total) by mouth daily.  ? hydrALAZINE (APRESOLINE) 25 MG tablet Take 1 tablet (25 mg total) by mouth every 8 (eight) hours.  ? isosorbide mononitrate (IMDUR) 30 MG 24 hr tablet Take 1 tablet (30 mg total) by mouth daily.  ? nicotine (NICODERM CQ - DOSED IN MG/24 HOURS) 21 mg/24hr patch Place 1 patch (21 mg total) onto the skin daily.  ? sacubitril-valsartan (ENTRESTO) 24-26 MG Take 1 tablet by mouth 2 (two) times daily.  ? Tiotropium Bromide-Olodaterol  (STIOLTO RESPIMAT) 2.5-2.5 MCG/ACT AERS Inhale 2 puffs into the lungs daily.  ? torsemide (DEMADEX) 20 MG tablet Take 1 tablet (20 mg total) by mouth 4 (four) times a week. Take Monday, Wednesday, Friday, Saturday. (Pa

## 2021-09-13 ENCOUNTER — Ambulatory Visit (INDEPENDENT_AMBULATORY_CARE_PROVIDER_SITE_OTHER): Payer: Medicare HMO | Admitting: Cardiovascular Disease

## 2021-09-13 ENCOUNTER — Encounter: Payer: Self-pay | Admitting: Cardiovascular Disease

## 2021-09-13 VITALS — BP 110/56 | HR 63 | Ht 60.0 in | Wt 156.4 lb

## 2021-09-13 DIAGNOSIS — I10 Essential (primary) hypertension: Secondary | ICD-10-CM | POA: Diagnosis not present

## 2021-09-13 DIAGNOSIS — I255 Ischemic cardiomyopathy: Secondary | ICD-10-CM | POA: Diagnosis not present

## 2021-09-13 DIAGNOSIS — I25118 Atherosclerotic heart disease of native coronary artery with other forms of angina pectoris: Secondary | ICD-10-CM

## 2021-09-13 DIAGNOSIS — E785 Hyperlipidemia, unspecified: Secondary | ICD-10-CM

## 2021-09-13 DIAGNOSIS — I471 Supraventricular tachycardia: Secondary | ICD-10-CM | POA: Diagnosis not present

## 2021-09-13 DIAGNOSIS — I5042 Chronic combined systolic (congestive) and diastolic (congestive) heart failure: Secondary | ICD-10-CM | POA: Diagnosis not present

## 2021-09-13 DIAGNOSIS — I340 Nonrheumatic mitral (valve) insufficiency: Secondary | ICD-10-CM | POA: Diagnosis not present

## 2021-09-13 NOTE — Patient Instructions (Signed)
Medication Instructions:  No changes  If you need a refill on your cardiac medications before your next appointment, please call your pharmacy.    Lab work: No new labs needed   Testing/Procedures: No new testing needed   Follow-Up: At [facility], you and your health needs are our priority.  As part of our continuing mission to provide you with exceptional heart care, we have created designated Provider Care Teams.  These Care Teams include your primary Cardiologist (physician) and Advanced Practice Providers (APPs -  Physician Assistants and Nurse Practitioners) who all work together to provide you with the care you need, when you need it.  You will need a follow up appointment in 6 months  Providers on your designated Care Team:   Christopher Berge, NP Ryan Dunn, PA-C Cadence Furth, PA-C  COVID-19 Vaccine Information can be found at: [email or web address] For questions related to vaccine distribution or appointments, please email [email or web address] or call [phone].   

## 2021-09-24 ENCOUNTER — Other Ambulatory Visit: Payer: Self-pay

## 2021-09-24 ENCOUNTER — Other Ambulatory Visit
Admission: RE | Admit: 2021-09-24 | Discharge: 2021-09-24 | Disposition: A | Discharge status: Home / Self Care | Payer: Medicare HMO | Attending: Cardiology | Admitting: Cardiology

## 2021-09-24 DIAGNOSIS — I25118 Atherosclerotic heart disease of native coronary artery with other forms of angina pectoris: Secondary | ICD-10-CM | POA: Insufficient documentation

## 2021-09-24 DIAGNOSIS — I447 Left bundle-branch block, unspecified: Secondary | ICD-10-CM | POA: Insufficient documentation

## 2021-09-24 DIAGNOSIS — I5042 Chronic combined systolic (congestive) and diastolic (congestive) heart failure: Secondary | ICD-10-CM | POA: Insufficient documentation

## 2021-09-24 LAB — CBC WITH DIFFERENTIAL/PLATELET
Abs Immature Granulocytes: 0.02 10*3/uL (ref 0.00–0.07)
Basophils Absolute: 0.1 10*3/uL (ref 0.0–0.1)
Basophils Relative: 1 %
Eosinophils Absolute: 0.1 10*3/uL (ref 0.0–0.5)
Eosinophils Relative: 2 %
HCT: 42.5 % (ref 36.0–46.0)
Hemoglobin: 13.3 g/dL (ref 12.0–15.0)
Immature Granulocytes: 0 %
Lymphocytes Relative: 16 %
Lymphs Abs: 1.1 10*3/uL (ref 0.7–4.0)
MCH: 32 pg (ref 26.0–34.0)
MCHC: 31.3 g/dL (ref 30.0–36.0)
MCV: 102.2 fL — ABNORMAL HIGH (ref 80.0–100.0)
Monocytes Absolute: 0.9 10*3/uL (ref 0.1–1.0)
Monocytes Relative: 13 %
Neutro Abs: 4.6 10*3/uL (ref 1.7–7.7)
Neutrophils Relative %: 68 %
Platelets: 271 10*3/uL (ref 150–400)
RBC: 4.16 MIL/uL (ref 3.87–5.11)
RDW: 14.5 % (ref 11.5–15.5)
WBC: 6.8 10*3/uL (ref 4.0–10.5)
nRBC: 0 % (ref 0.0–0.2)

## 2021-09-24 LAB — BASIC METABOLIC PANEL
Anion gap: 8 (ref 5–15)
BUN: 26 mg/dL — ABNORMAL HIGH (ref 8–23)
CO2: 30 mmol/L (ref 22–32)
Calcium: 9.3 mg/dL (ref 8.9–10.3)
Chloride: 100 mmol/L (ref 98–111)
Creatinine, Ser: 1.55 mg/dL — ABNORMAL HIGH (ref 0.44–1.00)
GFR, Estimated: 34 mL/min — ABNORMAL LOW (ref >60)
Glucose, Bld: 88 mg/dL (ref 70–99)
Potassium: 4.1 mmol/L (ref 3.5–5.1)
Sodium: 138 mmol/L (ref 135–145)

## 2021-09-24 MED ORDER — EMPAGLIFLOZIN 10 MG PO TABS: 10.0000 mg | ORAL_TABLET | Freq: Every day | ORAL | 4 refills | Status: DC

## 2021-10-04 NOTE — Pre-Procedure Instructions (Signed)
Attempted to call patient regarding procedure instructions.  No answer 

## 2021-10-05 ENCOUNTER — Other Ambulatory Visit: Payer: Self-pay

## 2021-10-05 ENCOUNTER — Ambulatory Visit (HOSPITAL_COMMUNITY)
Admission: RE | Admit: 2021-10-05 | Discharge: 2021-10-06 | Disposition: A | Discharge status: Home / Self Care | Payer: Medicare HMO | Source: Ambulatory Visit | Attending: Cardiology | Admitting: Cardiology

## 2021-10-05 ENCOUNTER — Encounter (HOSPITAL_COMMUNITY)
Admission: RE | Disposition: A | Discharge status: Home / Self Care | Payer: Medicare HMO | Source: Ambulatory Visit | Attending: Cardiology

## 2021-10-05 ENCOUNTER — Encounter (HOSPITAL_COMMUNITY): Payer: Self-pay | Admitting: Cardiology

## 2021-10-05 DIAGNOSIS — I25118 Atherosclerotic heart disease of native coronary artery with other forms of angina pectoris: Secondary | ICD-10-CM | POA: Diagnosis not present

## 2021-10-05 DIAGNOSIS — I447 Left bundle-branch block, unspecified: Secondary | ICD-10-CM | POA: Diagnosis not present

## 2021-10-05 DIAGNOSIS — Z951 Presence of aortocoronary bypass graft: Secondary | ICD-10-CM | POA: Insufficient documentation

## 2021-10-05 DIAGNOSIS — Z79899 Other long term (current) drug therapy: Secondary | ICD-10-CM | POA: Diagnosis not present

## 2021-10-05 DIAGNOSIS — I5022 Chronic systolic (congestive) heart failure: Secondary | ICD-10-CM | POA: Diagnosis not present

## 2021-10-05 DIAGNOSIS — I5042 Chronic combined systolic (congestive) and diastolic (congestive) heart failure: Secondary | ICD-10-CM | POA: Insufficient documentation

## 2021-10-05 DIAGNOSIS — Z87891 Personal history of nicotine dependence: Secondary | ICD-10-CM | POA: Insufficient documentation

## 2021-10-05 DIAGNOSIS — I255 Ischemic cardiomyopathy: Secondary | ICD-10-CM | POA: Diagnosis present

## 2021-10-05 HISTORY — PX: BIV ICD INSERTION CRT-D: EP1195

## 2021-10-05 SURGERY — BIV ICD INSERTION CRT-D

## 2021-10-05 MED ORDER — MIDAZOLAM HCL 5 MG/5ML IJ SOLN
INTRAMUSCULAR | Status: DC | PRN
  Administered 2021-10-05 (×2): 1 mg via INTRAVENOUS

## 2021-10-05 MED ORDER — CEFAZOLIN SODIUM-DEXTROSE 2-4 GM/100ML-% IV SOLN
INTRAVENOUS | Status: AC
  Filled 2021-10-05: qty 100

## 2021-10-05 MED ORDER — ONDANSETRON HCL 4 MG/2ML IJ SOLN: 4.0000 mg | Freq: Four times a day (QID) | INTRAMUSCULAR | Status: DC | PRN

## 2021-10-05 MED ORDER — CHLORHEXIDINE GLUCONATE 4 % EX LIQD
4.0000 "application " | Freq: Once | CUTANEOUS | Status: DC
  Filled 2021-10-05: qty 60

## 2021-10-05 MED ORDER — EMPAGLIFLOZIN 10 MG PO TABS
10.0000 mg | ORAL_TABLET | Freq: Every day | ORAL | Status: DC
  Administered 2021-10-06: 10 mg via ORAL
  Filled 2021-10-05: qty 1

## 2021-10-05 MED ORDER — LIDOCAINE HCL (PF) 1 % IJ SOLN
INTRAMUSCULAR | Status: AC
Start: 2021-10-05 — End: ?
  Filled 2021-10-05: qty 60

## 2021-10-05 MED ORDER — ISOSORBIDE MONONITRATE ER 30 MG PO TB24
30.0000 mg | ORAL_TABLET | Freq: Every day | ORAL | Status: DC
  Administered 2021-10-06: 30 mg via ORAL
  Filled 2021-10-05: qty 1

## 2021-10-05 MED ORDER — CARVEDILOL 3.125 MG PO TABS
3.1250 mg | ORAL_TABLET | Freq: Two times a day (BID) | ORAL | Status: DC
  Administered 2021-10-05 – 2021-10-06 (×2): 3.125 mg via ORAL
  Filled 2021-10-05 (×2): qty 1

## 2021-10-05 MED ORDER — FENTANYL CITRATE (PF) 100 MCG/2ML IJ SOLN
INTRAMUSCULAR | Status: DC | PRN
Start: 2021-10-05 — End: 2021-10-05
  Administered 2021-10-05 (×2): 25 ug via INTRAVENOUS

## 2021-10-05 MED ORDER — MIDAZOLAM HCL 5 MG/5ML IJ SOLN
INTRAMUSCULAR | Status: AC
  Filled 2021-10-05: qty 5

## 2021-10-05 MED ORDER — ATORVASTATIN CALCIUM 80 MG PO TABS
80.0000 mg | ORAL_TABLET | Freq: Every day | ORAL | Status: DC
  Administered 2021-10-06: 80 mg via ORAL
  Filled 2021-10-05: qty 1

## 2021-10-05 MED ORDER — AMIODARONE HCL 200 MG PO TABS
200.0000 mg | ORAL_TABLET | Freq: Every day | ORAL | Status: DC
  Administered 2021-10-06: 200 mg via ORAL
  Filled 2021-10-05: qty 1

## 2021-10-05 MED ORDER — LIDOCAINE HCL (PF) 1 % IJ SOLN
INTRAMUSCULAR | Status: DC | PRN
Start: 2021-10-05 — End: 2021-10-05
  Administered 2021-10-05: 60 mL
  Administered 2021-10-05: 15 mL

## 2021-10-05 MED ORDER — LIDOCAINE HCL (PF) 1 % IJ SOLN
INTRAMUSCULAR | Status: AC
  Filled 2021-10-05: qty 30

## 2021-10-05 MED ORDER — SACUBITRIL-VALSARTAN 24-26 MG PO TABS: 1.0000 | ORAL_TABLET | Freq: Two times a day (BID) | ORAL | Status: DC

## 2021-10-05 MED ORDER — HYDRALAZINE HCL 25 MG PO TABS
25.0000 mg | ORAL_TABLET | Freq: Three times a day (TID) | ORAL | Status: DC
  Administered 2021-10-05 – 2021-10-06 (×2): 25 mg via ORAL
  Filled 2021-10-05 (×2): qty 1

## 2021-10-05 MED ORDER — CEFAZOLIN SODIUM-DEXTROSE 2-4 GM/100ML-% IV SOLN
2.0000 g | INTRAVENOUS | Status: AC
  Administered 2021-10-05: 2 g via INTRAVENOUS

## 2021-10-05 MED ORDER — SODIUM CHLORIDE 0.9 % IV SOLN
INTRAVENOUS | Status: AC
  Filled 2021-10-05: qty 2

## 2021-10-05 MED ORDER — SODIUM CHLORIDE 0.9 % IV SOLN
80.0000 mg | INTRAVENOUS | Status: AC
  Administered 2021-10-05: 80 mg

## 2021-10-05 MED ORDER — HEPARIN (PORCINE) IN NACL 1000-0.9 UT/500ML-% IV SOLN
INTRAVENOUS | Status: DC | PRN
  Administered 2021-10-05: 500 mL

## 2021-10-05 MED ORDER — FENTANYL CITRATE (PF) 100 MCG/2ML IJ SOLN
INTRAMUSCULAR | Status: AC
  Filled 2021-10-05: qty 2

## 2021-10-05 MED ORDER — HEPARIN (PORCINE) IN NACL 1000-0.9 UT/500ML-% IV SOLN
INTRAVENOUS | Status: AC
  Filled 2021-10-05: qty 500

## 2021-10-05 MED ORDER — POVIDONE-IODINE 10 % EX SWAB: 2.0000 "application " | Freq: Once | CUTANEOUS | Status: DC

## 2021-10-05 MED ORDER — SODIUM CHLORIDE 0.9 % IV SOLN: INTRAVENOUS | Status: DC

## 2021-10-05 MED ORDER — ACETAMINOPHEN 325 MG PO TABS
325.0000 mg | ORAL_TABLET | ORAL | Status: DC | PRN
  Administered 2021-10-05 – 2021-10-06 (×2): 650 mg via ORAL
  Filled 2021-10-05: qty 2
  Filled 2021-10-05: qty 1
  Filled 2021-10-05: qty 2

## 2021-10-05 SURGICAL SUPPLY — 10 items
CABLE SURGICAL S-101-97-12 (CABLE) ×2 IMPLANT
ICD UNIFY ASUR CRT CD3357-40Q (ICD Generator) ×1 IMPLANT
LEAD DURATA 7122Q-58CM (Lead) ×1 IMPLANT
LEAD TENDRIL MRI 52CM LPA1200M (Lead) ×1 IMPLANT
PAD DEFIB RADIO PHYSIO CONN (PAD) ×2 IMPLANT
POUCH AIGIS-R ANTIBACT ICD (Mesh General) ×2 IMPLANT
POUCH AIGIS-R ANTIBACT ICD LRG (Mesh General) IMPLANT
SHEATH 7FR PRELUDE SNAP 13 (SHEATH) IMPLANT
SHEATH 8FR PRELUDE SNAP 13 (SHEATH) ×2 IMPLANT
TRAY PACEMAKER INSERTION (PACKS) ×2 IMPLANT

## 2021-10-05 NOTE — H&P (Signed)
Electrophysiology Office Note:     Date:  10/05/2021    ID:  Dana [name], DOB 10-24-1945, MRN [id number]   PCP:  Rick Duff, MD          [facility] Cardiologist:  None  [facility] Electrophysiologist:  Vickie Epley, MD    Referring MD: Arvil Chaco, PA*    Chief Complaint: chronic HF   History of Present Illness:     Dana [name] is a 76 y.o. female who presents for an evaluation of chronic HF at the request of Dr Rockey Situ. Their medical history includes chronic systolic heart failure, left bundle branch block, coronary artery disease.  Patient has a history of CABG in 2013.  During that procedure she actually had an epicardial left ventricular lead placed but the patient was subsequently lost to follow-up.   Today she tells me she is doing okay.  Has some fatigue.  No syncope.  She is "ready" to proceed with ICD implant.  No problems with her medications.   Plan for CRT-D implant today.      Objective        Past Medical History:  Diagnosis Date   CHF (congestive heart failure) (HCC)     Complication of anesthesia      "difficulty waking up, it was all day long"   Coronary artery disease     Hyperlipidemia     LBBB (left bundle branch block)     Psoriasis             Past Surgical History:  Procedure Laterality Date   CARDIAC CATHETERIZATION   02/17/2012   CORONARY ARTERY BYPASS GRAFT   02/23/2012    Procedure: CORONARY ARTERY BYPASS GRAFTING (CABG);  Surgeon: Melrose Nakayama, MD;  Location: [place];  Service: Open Heart Surgery;  Laterality: N/A;  coronary artery bypass graft on pump times two utlizing left internal mammary artery and right greater saphenous vein harvested endoscopically, transesophageal echocardiogram    NO PAST SURGERIES       RIGHT/LEFT HEART CATH AND CORONARY/GRAFT ANGIOGRAPHY N/A 07/30/2018    Procedure: RIGHT/LEFT HEART CATH AND CORONARY/GRAFT ANGIOGRAPHY;  Surgeon: Wellington Hampshire, MD;  Location: [place]  CV LAB;  Service: Cardiovascular;  Laterality: N/A;   TEE WITHOUT CARDIOVERSION   02/22/2012    Procedure: TRANSESOPHAGEAL ECHOCARDIOGRAM (TEE);  Surgeon: Jolaine Artist, MD;  Location: [facility] END[facility];  Service: Cardiovascular;  Laterality: N/A;      Current Medications: Active Medications      Current Meds  Medication Sig   acetaminophen (TYLENOL) 325 MG tablet Take 2 tablets (650 mg total) by mouth every 4 (four) hours as needed for headache or mild pain.   albuterol (VENTOLIN HFA) 108 (90 Base) MCG/ACT inhaler TAKE 2 PUFFS BY MOUTH EVERY 6 HOURS AS NEEDED FOR WHEEZE OR SHORTNESS OF BREATH   amiodarone (PACERONE) 200 MG tablet Take 1 tablet (200 mg total) by mouth daily.   aspirin 81 MG chewable tablet Chew 1 tablet (81 mg total) by mouth daily.   atorvastatin (LIPITOR) 80 MG tablet TAKE 1 TABLET BY MOUTH DAILY   carvedilol (COREG) 3.125 MG tablet Take 1 tablet (3.125 mg total) by mouth 2 (two) times daily with a meal.   empagliflozin (JARDIANCE) 10 MG TABS tablet Take 1 tablet (10 mg total) by mouth daily.   hydrALAZINE (APRESOLINE) 25 MG tablet Take 1 tablet (25 mg total) by mouth every 8 (eight) hours.   isosorbide mononitrate (IMDUR) 30 MG 24 hr  tablet Take 1 tablet (30 mg total) by mouth daily.   nicotine (NICODERM CQ - DOSED IN MG/24 HOURS) 21 mg/24hr patch Place 1 patch (21 mg total) onto the skin daily.   nicotine polacrilex (NICORETTE) 4 MG gum Take 1 each (4 mg total) by mouth as needed for smoking cessation.   sacubitril-valsartan (ENTRESTO) 24-26 MG Take 1 tablet by mouth 2 (two) times daily.   Tiotropium Bromide-Olodaterol (STIOLTO RESPIMAT) 2.5-2.5 MCG/ACT AERS Inhale 2 puffs into the lungs daily.   torsemide (DEMADEX) 20 MG tablet Take 1 tablet (20 mg total) by mouth 4 (four) times a week. Take Monday, Wednesday, Friday, Saturday.        Allergies:   Bevespi aerosphere [glycopyrrolate-formoterol]    Social History         Socioeconomic History   Marital status:  Divorced      Spouse name: Not on file   Number of children: Not on file   Years of education: Not on file   Highest education level: Not on file  Occupational History   Not on file  Tobacco Use   Smoking status: Former      Types: Cigarettes      Quit date: 11/30/2020      Years since quitting: 0.7   Smokeless tobacco: Never   Tobacco comments:      Smoked but cut down when started to get sick  Substance and Sexual Activity   Alcohol use: No   Drug use: No   Sexual activity: Not on file  Other Topics Concern   Not on file  Social History Narrative   Not on file    Social Determinants of Health    Financial Resource Strain: Not on file  Food Insecurity: Not on file  Transportation Needs: Not on file  Physical Activity: Not on file  Stress: Not on file  Social Connections: Not on file      Family History: The patient's family history includes Heart disease in her father.   ROS:   Please see the history of present illness.    All other systems reviewed and are negative.   EKGs/Labs/Other Studies Reviewed:     The following studies were reviewed today:     2021/02/01 chest x-ray personally reviewed Epicardial lead on lateral left ventricular wall in good position fluoroscopically.  The lead is tunneled to the left chest.  There is no pulse generator in place.  Heart is enlarged.  Evidence of median sternotomy     January 17, 2021 echo Left ventricular function 10 to 15% Severely dilated left ventricle Severely reduced right ventricular function Mildly dilated left atrium Severe MR   February 05, 2021 EKG shows wide left bundle, nearly 180-200 ms         Recent Labs: 2021/02/01: B Natriuretic Peptide 2,682.2 02/05/2021: ALT 20; Hemoglobin 13.8; Platelets 398; TSH 13.600 08/19/2021: BUN 30; Creatinine, Ser 1.61; Magnesium 2.3; Potassium 4.3; Sodium 144  Recent Lipid Panel Labs (Brief)          Component Value Date/Time    CHOL 151 08/19/2021 1639     CHOL 123 02/17/2012 0314    TRIG 222 (H) 08/19/2021 1639    TRIG 99 02/17/2012 0314    HDL 64 08/19/2021 1639    HDL 30 (L) 02/17/2012 0314    CHOLHDL 2.4 08/19/2021 1639    CHOLHDL 3.2 07/27/2018 0417    VLDL 20 07/27/2018 0417    VLDL 20 02/17/2012 0314  LDLCALC 52 08/19/2021 1639    LDLCALC 73 02/17/2012 0314    LDLDIRECT 124 (H) 02/05/2021 1256        Physical Exam:     VS:  BP 141/58   Pulse 65   Ht 5' (1.524 m)   Wt 157 lb (71.2 kg)   SpO2 96%   BMI 30.66 kg/m         Wt Readings from Last 3 Encounters:  09/01/21 157 lb (71.2 kg)  08/19/21 157 lb 14.4 oz (71.6 kg)  07/22/21 157 lb 9.6 oz (71.5 kg)      GEN:  Well nourished, well developed in no acute distress HEENT: Normal NECK: No JVD; No carotid bruits LYMPHATICS: No lymphadenopathy CARDIAC: RRR, no murmurs, rubs, gallops  RESPIRATORY:  Clear to auscultation without rales, wheezing or rhonchi  ABDOMEN: Soft, non-tender, non-distended MUSCULOSKELETAL:  No edema; No deformity  SKIN: Warm and dry NEUROLOGIC:  Alert and oriented x 3 PSYCHIATRIC:  Normal affect          Assessment     ASSESSMENT:     1. Coronary artery disease of native artery of native heart with stable angina pectoris ([place])   2. Left bundle branch block   3. Chronic combined systolic and diastolic congestive heart failure (HCC)     PLAN:     In order of problems listed above:     #Chronic systolic and diastolic heart failure NYHA class III.  Warm and dry on exam.  On good medical therapy with Coreg, Jardiance, hydralazine, Imdur, Entresto and torsemide. Has a left bundle branch block that is extremely wide.  His epicardial left ventricular lead is never been attached to a pulse generator for CRT.  I have recommended CRT-D implant.  We will plan to implant a new right atrial and right ventricular lead (single coil).  We will attach these 2 leads to the pulse generator along with the epicardial LV lead as long as the  electrical parameters are stable on that lead.  If the epicardial LV lead does not show good electrical parameters, would plan to implant a new CS lead at the time of CRT-D implant.  I discussed the procedure in detail the patient including the risk, recovery and she wishes to proceed.    The patient has an ischemic CM (EF 15%), NYHA Class III CHF, and CAD.  They are referred by Dr Rockey Situ for risk stratification of sudden death and consideration of ICD implantation.  At this time, she meets criteria for ICD implantation for primary prevention of sudden death.  I have had a thorough discussion with the patient reviewing options.  The patient and their family (if available) have had opportunities to ask questions and have them answered. The patient and I have decided together through a shared decision making process to proceed with ICD implant at this time.     Risks, benefits, alternatives to ICD implantation were discussed in detail with the patient today. The patient understands that the risks include but are not limited to bleeding, infection, pneumothorax, perforation, tamponade, vascular damage, renal failure, MI, stroke, death, inappropriate shocks, and lead dislodgement and wishes to proceed.  We will therefore schedule device implantation at the next available time.   Plan for a [facility] Jude device.   I will also update her blood work given recent blood work showing an elevated creatinine.    #Coronary artery disease No ischemic symptoms today.  #Tobacco abuse Cessation encouraged.  Signed, Hilton Cork. Quentin Ore, MD, [facility], [facility] 10/05/2021 Electrophysiology [facility]

## 2021-10-06 ENCOUNTER — Ambulatory Visit (HOSPITAL_COMMUNITY): Payer: Medicare HMO

## 2021-10-06 DIAGNOSIS — Z951 Presence of aortocoronary bypass graft: Secondary | ICD-10-CM | POA: Diagnosis not present

## 2021-10-06 DIAGNOSIS — Z87891 Personal history of nicotine dependence: Secondary | ICD-10-CM | POA: Diagnosis not present

## 2021-10-06 DIAGNOSIS — I25118 Atherosclerotic heart disease of native coronary artery with other forms of angina pectoris: Secondary | ICD-10-CM | POA: Diagnosis not present

## 2021-10-06 DIAGNOSIS — I517 Cardiomegaly: Secondary | ICD-10-CM | POA: Diagnosis not present

## 2021-10-06 DIAGNOSIS — I255 Ischemic cardiomyopathy: Secondary | ICD-10-CM | POA: Diagnosis not present

## 2021-10-06 DIAGNOSIS — Z79899 Other long term (current) drug therapy: Secondary | ICD-10-CM | POA: Diagnosis not present

## 2021-10-06 DIAGNOSIS — I5042 Chronic combined systolic (congestive) and diastolic (congestive) heart failure: Secondary | ICD-10-CM | POA: Diagnosis not present

## 2021-10-06 DIAGNOSIS — Z95810 Presence of automatic (implantable) cardiac defibrillator: Secondary | ICD-10-CM | POA: Diagnosis not present

## 2021-10-06 DIAGNOSIS — I447 Left bundle-branch block, unspecified: Secondary | ICD-10-CM | POA: Diagnosis not present

## 2021-10-06 LAB — GLUCOSE, CAPILLARY: Glucose-Capillary: 85 mg/dL (ref 70–99)

## 2021-10-06 MED ORDER — TRAMADOL HCL 50 MG PO TABS: 50.0000 mg | ORAL_TABLET | Freq: Two times a day (BID) | ORAL | 0 refills | Status: AC | PRN

## 2021-10-06 MED ORDER — TRAMADOL HCL 50 MG PO TABS: 50.0000 mg | ORAL_TABLET | Freq: Two times a day (BID) | ORAL | 0 refills | Status: DC | PRN

## 2021-10-06 NOTE — Discharge Instructions (Signed)
    Supplemental Discharge Instructions for  Pacemaker/Defibrillator Patients   Activity No heavy lifting or vigorous activity with your left/right arm for 6 to 8 weeks.  Do not raise your left/right arm above your head for one week.  Gradually raise your affected arm as drawn below.              10/09/21                    10/10/21                     10/11/21                 10/12/21 __  NO DRIVING for  1 week   ; you may begin driving on  5/62/56 .  WOUND CARE Keep the wound area clean and dry.  Do not get this area wet , no showers for one week; you may shower on  10/12/21   . The tape/steri-strips on your wound will fall off; do not pull them off.  No bandage is needed on the site.  DO  NOT apply any creams, oils, or ointments to the wound area. If you notice any drainage or discharge from the wound, any swelling or bruising at the site, or you develop a fever > 101? F after you are discharged home, call the office at once.  Special Instructions You are still able to use cellular telephones; use the ear opposite the side where you have your pacemaker/defibrillator.  Avoid carrying your cellular phone near your device. When traveling through airports, show security personnel your identification card to avoid being screened in the metal detectors.  Ask the security personnel to use the hand wand. Avoid arc welding equipment, MRI testing (magnetic resonance imaging), TENS units (transcutaneous nerve stimulators).  Call the office for questions about other devices. Avoid electrical appliances that are in poor condition or are not properly grounded. Microwave ovens are safe to be near or to operate.  Additional information for defibrillator patients should your device go off: If your device goes off ONCE and you feel fine afterward, notify the device clinic nurses. If your device goes off ONCE and you do not feel well afterward, call 911. If your device goes off TWICE, call 911. If your  device goes off THREE times in one day, call 911.  DO NOT DRIVE YOURSELF OR A FAMILY MEMBER WITH A DEFIBRILLATOR TO THE HOSPITAL--CALL 911.

## 2021-10-06 NOTE — Plan of Care (Signed)
  Problem: Health Behavior/Discharge Planning: Goal: Ability to manage health-related needs will improve Outcome: Progressing   Problem: Clinical Measurements: Goal: Ability to maintain clinical measurements within normal limits will improve Outcome: Progressing   

## 2021-10-06 NOTE — TOC Transition Note (Signed)
Transition of Care [facility]) - CM/SW Discharge Note   Patient Details  Name: Dana [name] MRN: [id number] Date of Birth: 1946/02/06  [facility] of [facility] CM/SW Contact:  Leone Haven, RN Phone Number: 10/06/2021, 10:58 AM   Clinical Narrative:    Patient is for dc today, she has no needs. Family member here to transport home.           Patient Goals and CMS Choice        Discharge Placement                       Discharge Plan and Services                                     Social Determinants of Health (SDOH) Interventions     Readmission Risk Interventions     View : No data to display.

## 2021-10-06 NOTE — Discharge Summary (Addendum)
ELECTROPHYSIOLOGY PROCEDURE DISCHARGE SUMMARY    Patient ID: Dana [name],  MRN: [id number], DOB/AGE: [birth year] 76 y.o.  Admit date: 10/05/2021 Discharge date: 10/06/2021  Primary Care Physician: Rick Duff, MD  Primary Cardiologist: Dr. Rockey Situ Electrophysiologist: Markham Jordan  Primary Discharge Diagnosis:  Chronic CHF (combined) CM (ischemic) LBBB  Secondary Discharge Diagnosis:  CAD Hx CABG Anxiety emphysema  Allergies  Allergen Reactions   Bevespi Aerosphere [Glycopyrrolate-Formoterol] Hives and Rash     Procedures This Admission:  1.  Implantation of a Abbott CRT-D on 10/05/21 by Dr Quentin Ore.  Utilizing a previously placed epicardial LV lead at the time of her CABG See procedure report for full details  DFT's were deferred at time of implant There were no immediate post procedure complications. CXR on 10/06/21 demonstrated no pneumothorax status post device implantation.   Brief HPI: Dana [name] is a 76 y.o. female was referred to electrophysiology in the outpatient setting for consideration of ICD implantation.  Past medical history includes above.  The patient has persistent LV dysfunction despite guideline directed therapy.  Risks, benefits, and alternatives to ICD implantation were reviewed with the patient who wished to proceed.   Hospital Course:  The patient was admitted and underwent implantation of a CRT-D.  She was monitored on telemetry overnight which demonstrated SR/BP.  Left chest was without hematoma or ecchymosis.  Device placement more superior then usual given pocket placement to get to her epicardial lead.  The device was interrogated and found to be functioning normally.  CXR was obtained and demonstrated no pneumothorax status post device implantation.  Wound care, arm mobility, and restrictions were reviewed with the patient.  The patient feels well, denies any CP or SOB, she has moderate site discomfort, and given the extent of pocket  dissection Dr. Quentin Ore would like her sent home with 5 days of tramadol.  She was examined by Dr. Quentin Ore and considered stable for discharge to home.   The patient's discharge medications include an ACE/ARB (Entresto) and beta blocker (carvedilol).   I  have reviewed PDMP she has no active prescriptions for any narcotics currently   Physical Exam: Vitals:   10/06/21 0718 10/06/21 0812 10/06/21 0831 10/06/21 0923  BP: (!) 123/56 (!) 107/49 (!) 107/49 (!) 102/58  Pulse: 65  69 68  Resp: 20   (!) 27  Temp: 97.9 F (36.6 C)     TempSrc: Oral     SpO2: 92%     Weight:      Height:        GEN- The patient is well appearing, alert and oriented x 3 today.   HEENT: normocephalic, atraumatic; sclera clear, conjunctiva pink; hearing intact; oropharynx clear Lungs- CTA b/l, normal work of breathing.  No wheezes, rales, rhonchi Heart- RRR, no murmurs, rubs or gallops, PMI not laterally displaced GI- soft, non-tender, non-distended Extremities- no clubbing, cyanosis, or edema MS- no significant deformity or atrophy Skin- warm and dry, no rash or lesion, left chest without hematoma/ecchymosis Psych- euthymic mood, full affect Neuro- no gross defecits  Labs:   Lab Results  Component Value Date   WBC 6.8 09/24/2021   HGB 13.3 09/24/2021   HCT 42.5 09/24/2021   MCV 102.2 (H) 09/24/2021   PLT 271 09/24/2021   No results for input(s): NA, K, CL, CO2, BUN, CREATININE, CALCIUM, PROT, BILITOT, ALKPHOS, ALT, AST, GLUCOSE in the last 168 hours.  Invalid input(s): LABALBU  Discharge Medications:  Allergies as of 10/06/2021       Reactions  Bevespi Aerosphere [glycopyrrolate-formoterol] Hives, Rash        Medication List     TAKE these medications    acetaminophen 325 MG tablet Commonly known as: TYLENOL Take 2 tablets (650 mg total) by mouth every 4 (four) hours as needed for headache or mild pain.   albuterol 108 (90 Base) MCG/ACT inhaler Commonly known as: VENTOLIN HFA TAKE 2  PUFFS BY MOUTH EVERY 6 HOURS AS NEEDED FOR WHEEZE OR SHORTNESS OF BREATH   amiodarone 200 MG tablet Commonly known as: PACERONE Take 1 tablet (200 mg total) by mouth daily.   aspirin 81 MG chewable tablet Chew 1 tablet (81 mg total) by mouth daily.   atorvastatin 80 MG tablet Commonly known as: LIPITOR TAKE 1 TABLET BY MOUTH DAILY   carvedilol 3.125 MG tablet Commonly known as: COREG Take 1 tablet (3.125 mg total) by mouth 2 (two) times daily with a meal.   empagliflozin 10 MG Tabs tablet Commonly known as: JARDIANCE Take 1 tablet (10 mg total) by mouth daily.   Entresto 24-26 MG Generic drug: sacubitril-valsartan Take 1 tablet by mouth 2 (two) times daily.   hydrALAZINE 25 MG tablet Commonly known as: APRESOLINE Take 1 tablet (25 mg total) by mouth every 8 (eight) hours.   isosorbide mononitrate 30 MG 24 hr tablet Commonly known as: IMDUR Take 1 tablet (30 mg total) by mouth daily.   nicotine polacrilex 4 MG gum Commonly known as: Nicorette Take 1 each (4 mg total) by mouth as needed for smoking cessation.   Stiolto Respimat 2.5-2.5 MCG/ACT Aers Generic drug: Tiotropium Bromide-Olodaterol Inhale 2 puffs into the lungs daily.   torsemide 20 MG tablet Commonly known as: DEMADEX Take 1 tablet (20 mg total) by mouth 4 (four) times a week. Take Monday, Wednesday, Friday, Saturday. What changed:  when to take this additional instructions   traMADol 50 MG tablet Commonly known as: Ultram Take 1 tablet (50 mg total) by mouth every 12 (twelve) hours as needed for up to 5 days for severe pain.        Disposition: Home Discharge Instructions     Diet - low sodium heart healthy   Complete by: As directed    Increase activity slowly   Complete by: As directed         Duration of Discharge Encounter: Greater than 30 minutes including physician time.  Venetia Night, PA-C 10/06/2021 10:31 AM

## 2021-10-11 ENCOUNTER — Other Ambulatory Visit: Payer: Self-pay | Admitting: Internal Medicine

## 2021-10-20 ENCOUNTER — Other Ambulatory Visit: Payer: Self-pay | Admitting: Internal Medicine

## 2021-10-20 ENCOUNTER — Ambulatory Visit (INDEPENDENT_AMBULATORY_CARE_PROVIDER_SITE_OTHER): Payer: Medicare HMO

## 2021-10-20 DIAGNOSIS — I255 Ischemic cardiomyopathy: Secondary | ICD-10-CM

## 2021-10-20 LAB — CUP PACEART INCLINIC DEVICE CHECK
Battery Remaining Longevity: 63 mo
Brady Statistic RA Percent Paced: 62 %
Brady Statistic RV Percent Paced: 99.34 %
Date Time Interrogation Session: 20230621165330
HighPow Impedance: 61.875
Implantable Lead Implant Date: 20131024
Implantable Lead Implant Date: 20230606
Implantable Lead Implant Date: 20230606
Implantable Lead Location: 753859
Implantable Lead Location: 753860
Implantable Lead Location: 753862
Implantable Lead Model: 5071
Implantable Pulse Generator Implant Date: 20230606
Lead Channel Impedance Value: 275 Ohm
Lead Channel Impedance Value: 462.5 Ohm
Lead Channel Impedance Value: 637.5 Ohm
Lead Channel Pacing Threshold Amplitude: 0.5 V
Lead Channel Pacing Threshold Amplitude: 0.5 V
Lead Channel Pacing Threshold Amplitude: 0.5 V
Lead Channel Pacing Threshold Amplitude: 1 V
Lead Channel Pacing Threshold Amplitude: 1 V
Lead Channel Pacing Threshold Pulse Width: 0.5 ms
Lead Channel Pacing Threshold Pulse Width: 0.5 ms
Lead Channel Pacing Threshold Pulse Width: 0.5 ms
Lead Channel Pacing Threshold Pulse Width: 0.5 ms
Lead Channel Pacing Threshold Pulse Width: 0.5 ms
Lead Channel Sensing Intrinsic Amplitude: 0.5 mV
Lead Channel Sensing Intrinsic Amplitude: 12 mV
Lead Channel Setting Pacing Amplitude: 2 V
Lead Channel Setting Pacing Amplitude: 2.5 V
Lead Channel Setting Pacing Amplitude: 3.5 V
Lead Channel Setting Pacing Pulse Width: 0.5 ms
Lead Channel Setting Pacing Pulse Width: 0.5 ms
Lead Channel Setting Sensing Sensitivity: 0.5 mV
Pulse Gen Serial Number: 5538587

## 2021-10-20 NOTE — Progress Notes (Signed)

## 2021-10-20 NOTE — Patient Instructions (Signed)

## 2021-11-09 DIAGNOSIS — H524 Presbyopia: Secondary | ICD-10-CM | POA: Diagnosis not present

## 2021-11-11 ENCOUNTER — Other Ambulatory Visit: Payer: Self-pay

## 2021-11-11 NOTE — Telephone Encounter (Signed)
albuterol (VENTOLIN HFA) 108 (90 Base) MCG/ACT inhaler, REFILL REQUEST @ [id number] - [place], [place] - [address].

## 2021-11-12 ENCOUNTER — Other Ambulatory Visit: Payer: Self-pay | Admitting: Internal Medicine

## 2021-11-18 MED ORDER — ALBUTEROL SULFATE HFA 108 (90 BASE) MCG/ACT IN AERS: INHALATION_SPRAY | RESPIRATORY_TRACT | 3 refills | Status: DC

## 2021-12-20 ENCOUNTER — Encounter: Payer: Medicare HMO | Admitting: Student

## 2021-12-20 ENCOUNTER — Ambulatory Visit: Payer: Medicare HMO

## 2021-12-22 ENCOUNTER — Encounter: Payer: Self-pay | Admitting: Student

## 2022-01-11 NOTE — Progress Notes (Unsigned)
Electrophysiology Office Follow up Visit Note:    Date:  01/12/2022   ID:  Dana [name], DOB 1945/08/21, MRN [id number]  PCP:  Marolyn Haller, MD  [facility] Cardiologist:  None  [facility] Electrophysiologist:  Lanier Prude, MD    Interval History:    Dana [name] is a 76 y.o. female who presents for a follow up visit.   She had a CRT-D implant 10/05/2021. Her system includes a tunneled LV epicardial lead. Remote interrogation showed stable device function after implant.   She reports some intermittent discomfort at the left prepectoral pocket near the leads passage through the pectoral muscle.  This is only with motion.  Incision is healed well.  She is with her son today in clinic.       Past Medical History:  Diagnosis Date   CHF (congestive heart failure) (HCC)    Complication of anesthesia    "difficulty waking up, it was all day long"   Coronary artery disease    Hyperlipidemia    LBBB (left bundle branch block)    Psoriasis     Past Surgical History:  Procedure Laterality Date   BIV ICD INSERTION CRT-D N/A 10/05/2021   Procedure: BIV ICD INSERTION CRT-D;  Surgeon: Lanier Prude, MD;  Location: [facility] INVASIVE CV LAB;  Service: Cardiovascular;  Laterality: N/A;   CARDIAC CATHETERIZATION  02/17/2012   CORONARY ARTERY BYPASS GRAFT  02/23/2012   Procedure: CORONARY ARTERY BYPASS GRAFTING (CABG);  Surgeon: Loreli Slot, MD;  Location: [facility] OR;  Service: Open Heart Surgery;  Laterality: N/A;  coronary artery bypass graft on pump times two utlizing left internal mammary artery and right greater saphenous vein harvested endoscopically, transesophageal echocardiogram    NO PAST SURGERIES     RIGHT/LEFT HEART CATH AND CORONARY/GRAFT ANGIOGRAPHY N/A 07/30/2018   Procedure: RIGHT/LEFT HEART CATH AND CORONARY/GRAFT ANGIOGRAPHY;  Surgeon: Iran Ouch, MD;  Location: [facility] INVASIVE CV LAB;  Service: Cardiovascular;  Laterality: N/A;   TEE WITHOUT  CARDIOVERSION  02/22/2012   Procedure: TRANSESOPHAGEAL ECHOCARDIOGRAM (TEE);  Surgeon: Dolores Patty, MD;  Location: [facility] ENDOSCOPY;  Service: Cardiovascular;  Laterality: N/A;    Current Medications: Current Meds  Medication Sig   acetaminophen (TYLENOL) 325 MG tablet Take 2 tablets (650 mg total) by mouth every 4 (four) hours as needed for headache or mild pain.   albuterol (VENTOLIN HFA) 108 (90 Base) MCG/ACT inhaler TAKE 2 PUFFS BY MOUTH EVERY 6 HOURS AS NEEDED FOR WHEEZE OR SHORTNESS OF BREATH   amiodarone (PACERONE) 200 MG tablet Take 1 tablet (200 mg total) by mouth daily.   aspirin 81 MG chewable tablet Chew 1 tablet (81 mg total) by mouth daily.   atorvastatin (LIPITOR) 80 MG tablet TAKE 1 TABLET BY MOUTH DAILY   carvedilol (COREG) 3.125 MG tablet TAKE 1 TABLET BY MOUTH TWICE A DAY WITH A MEAL   empagliflozin (JARDIANCE) 10 MG TABS tablet Take 1 tablet (10 mg total) by mouth daily.   hydrALAZINE (APRESOLINE) 25 MG tablet Take 1 tablet (25 mg total) by mouth every 8 (eight) hours.   isosorbide mononitrate (IMDUR) 30 MG 24 hr tablet TAKE 1 TABLET BY MOUTH EVERY DAY   nicotine polacrilex (NICORETTE) 4 MG gum Take 1 each (4 mg total) by mouth as needed for smoking cessation.   sacubitril-valsartan (ENTRESTO) 24-26 MG Take 1 tablet by mouth 2 (two) times daily.   Tiotropium Bromide-Olodaterol (STIOLTO RESPIMAT) 2.5-2.5 MCG/ACT AERS Inhale 2 puffs into the lungs daily.  torsemide (DEMADEX) 20 MG tablet Take 1 tablet (20 mg total) by mouth 4 (four) times a week. Take Monday, Wednesday, Friday, Saturday. (Patient taking differently: Take 20 mg by mouth every other day.)     Allergies:   Bevespi aerosphere [glycopyrrolate-formoterol]   Social History   Socioeconomic History   Marital status: Divorced    Spouse name: Not on file   Number of children: Not on file   Years of education: Not on file   Highest education level: Not on file  Occupational History   Not on file  Tobacco  Use   Smoking status: Some Days    Types: Cigarettes    Last attempt to quit: 11/30/2020    Years since quitting: 1.1   Smokeless tobacco: Never   Tobacco comments:    Smoked but cut down when started to get sick  Vaping Use   Vaping Use: Never used  Substance and Sexual Activity   Alcohol use: No   Drug use: No   Sexual activity: Not on file  Other Topics Concern   Not on file  Social History Narrative   Not on file   Social Determinants of Health   Financial Resource Strain: Not on file  Food Insecurity: Not on file  Transportation Needs: Not on file  Physical Activity: Not on file  Stress: Not on file  Social Connections: Not on file     Family History: The patient's family history includes Heart disease in her father.  ROS:   Please see the history of present illness.    All other systems reviewed and are negative.  EKGs/Labs/Other Studies Reviewed:    The following studies were reviewed today:  01/12/2022 in clinic device interrogation personally reviewed Unipolar LV threshold elevated and functionally noncapture on arrival.  Reprogrammed to higher pacing output with appropriate capture confirmed by EKG. Lead parameters otherwise stable Battery longevity 4 years No high-voltage therapies today BiV pacing greater than 99%  EKG:  The ekg ordered today demonstrates BiV pacing after LV lead output adjusted.  Recent Labs: 01/16/2021: B Natriuretic Peptide 2,682.2 02/05/2021: ALT 20; TSH 13.600 08/19/2021: Magnesium 2.3 09/24/2021: BUN 26; Creatinine, Ser 1.55; Hemoglobin 13.3; Platelets 271; Potassium 4.1; Sodium 138  Recent Lipid Panel    Component Value Date/Time   CHOL 151 08/19/2021 1639   CHOL 123 02/17/2012 0314   TRIG 222 (H) 08/19/2021 1639   TRIG 99 02/17/2012 0314   HDL 64 08/19/2021 1639   HDL 30 (L) 02/17/2012 0314   CHOLHDL 2.4 08/19/2021 1639   CHOLHDL 3.2 07/27/2018 0417   VLDL 20 07/27/2018 0417   VLDL 20 02/17/2012 0314   LDLCALC 52  08/19/2021 1639   LDLCALC 73 02/17/2012 0314   LDLDIRECT 124 (H) 02/05/2021 1256    Physical Exam:    VS:  BP 130/60 (BP Location: Left Arm, Patient Position: Sitting, Cuff Size: Normal)   Pulse 60   Ht 5' (1.524 m)   Wt 162 lb 6 oz (73.7 kg)   SpO2 94%   BMI 31.71 kg/m     Wt Readings from Last 3 Encounters:  01/12/22 162 lb 6 oz (73.7 kg)  10/06/21 153 lb 14.1 oz (69.8 kg)  09/13/21 156 lb 6 oz (70.9 kg)     GEN:  Well nourished, well developed in no acute distress HEENT: Normal NECK: No JVD; No carotid bruits LYMPHATICS: No lymphadenopathy CARDIAC: RRR, no murmurs, rubs, gallops. Prepectoral pocket well healed.  Her device is more medial than usual  given location of tunneled LV lead.  Incision is healed well. RESPIRATORY:  Clear to auscultation without rales, wheezing or rhonchi  ABDOMEN: Soft, non-tender, non-distended MUSCULOSKELETAL:  No edema; No deformity  SKIN: Warm and dry NEUROLOGIC:  Alert and oriented x 3 PSYCHIATRIC:  Normal affect        ASSESSMENT:    1. Chronic combined systolic and diastolic congestive heart failure (HCC)   2. Left bundle branch block   3. Cardiac resynchronization therapy defibrillator (CRT-D) in place    PLAN:    In order of problems listed above:  #Chronic combined systolic/diastolic HF NYHA 2. Warm and dry. Continue current medical therapy. Repeat echo in 6 to 8 weeks to allow time to biventricular pace  #CRT-D in place Device functioning appropriately. Continue remote monitoring.  F/u 1 year.   Medication Adjustments/Labs and Tests Ordered: Current medicines are reviewed at length with the patient today.  Concerns regarding medicines are outlined above.  Orders Placed This Encounter  Procedures   EKG 12-Lead   ECHOCARDIOGRAM COMPLETE   No orders of the defined types were placed in this encounter.    Signed, Steffanie Dunn, MD, [facility], [facility] 01/12/2022 11:31 AM    Electrophysiology [facility]  [facility]

## 2022-01-12 ENCOUNTER — Ambulatory Visit: Payer: Medicare HMO | Attending: Cardiology | Admitting: Cardiology

## 2022-01-12 ENCOUNTER — Encounter: Payer: Self-pay | Admitting: Cardiology

## 2022-01-12 VITALS — BP 130/60 | HR 60 | Ht 60.0 in | Wt 162.4 lb

## 2022-01-12 DIAGNOSIS — I447 Left bundle-branch block, unspecified: Secondary | ICD-10-CM | POA: Diagnosis not present

## 2022-01-12 DIAGNOSIS — I5042 Chronic combined systolic (congestive) and diastolic (congestive) heart failure: Secondary | ICD-10-CM

## 2022-01-12 DIAGNOSIS — Z95810 Presence of automatic (implantable) cardiac defibrillator: Secondary | ICD-10-CM | POA: Diagnosis not present

## 2022-01-12 NOTE — Patient Instructions (Signed)
Medication Instructions:  none *If you need a refill on your cardiac medications before your next appointment, please call your pharmacy*   Lab Work: none If you have labs (blood work) drawn today and your tests are completely normal, you will receive your results only by: MyChart Message (if you have MyChart) OR A paper copy in the mail If you have any lab test that is abnormal or we need to change your treatment, we will call you to review the results.   Testing/Procedures: please schedule in 6-8 weeks Your physician has requested that you have an echocardiogram. Echocardiography is a painless test that uses sound waves to create images of your heart. It provides your doctor with information about the size and shape of your heart and how well your heart's chambers and valves are working. This procedure takes approximately one hour. There are no restrictions for this procedure.    Follow-Up: At [facility], you and your health needs are our priority.  As part of our continuing mission to provide you with exceptional heart care, we have created designated Provider Care Teams.  These Care Teams include your primary Cardiologist (physician) and Advanced Practice Providers (APPs -  Physician Assistants and Nurse Practitioners) who all work together to provide you with the care you need, when you need it.  We recommend signing up for the patient portal called "MyChart".  Sign up information is provided on this After Visit Summary.  MyChart is used to connect with patients for Virtual Visits (Telemedicine).  Patients are able to view lab/test results, encounter notes, upcoming appointments, etc.  Non-urgent messages can be sent to your provider as well.   To learn more about what you can do with MyChart, go to [email or web address].    Your next appointment:   1 year(s)  The format for your next appointment:   In Person  Provider:   You will see one of the following Advanced  Practice Providers on your designated Care Team:   Nicolasa Ducking, NP Eula Listen, PA-C Cadence Fransico Michael, PA-C Charlsie Quest, NP      Other Instructions none  Important Information About Sugar

## 2022-01-19 ENCOUNTER — Encounter: Payer: Medicare HMO | Admitting: Cardiology

## 2022-01-23 ENCOUNTER — Other Ambulatory Visit: Payer: Self-pay | Admitting: Internal Medicine

## 2022-01-26 ENCOUNTER — Other Ambulatory Visit: Payer: Self-pay | Admitting: Internal Medicine

## 2022-01-26 NOTE — Telephone Encounter (Signed)
Call to [facility] computers were down on Monday no refills were received.  Need to resend prescription for Pacerone .

## 2022-02-19 ENCOUNTER — Other Ambulatory Visit: Payer: Self-pay | Admitting: Internal Medicine

## 2022-02-23 ENCOUNTER — Ambulatory Visit: Payer: Medicare HMO | Attending: Cardiology

## 2022-02-23 DIAGNOSIS — I447 Left bundle-branch block, unspecified: Secondary | ICD-10-CM | POA: Diagnosis not present

## 2022-02-23 DIAGNOSIS — I5042 Chronic combined systolic (congestive) and diastolic (congestive) heart failure: Secondary | ICD-10-CM | POA: Diagnosis not present

## 2022-02-23 DIAGNOSIS — Z95810 Presence of automatic (implantable) cardiac defibrillator: Secondary | ICD-10-CM

## 2022-02-23 LAB — ECHOCARDIOGRAM COMPLETE
AR max vel: 4.48 cm2
AV Area VTI: 3.85 cm2
AV Area mean vel: 4.34 cm2
AV Mean grad: 1 mmHg
AV Peak grad: 1.8 mmHg
Ao pk vel: 0.68 m/s
Area-P 1/2: 5.02 cm2
S' Lateral: 6.1 cm

## 2022-02-23 MED ORDER — PERFLUTREN LIPID MICROSPHERE
1.0000 mL | INTRAVENOUS | Status: AC | PRN
  Administered 2022-02-23: 2 mL via INTRAVENOUS

## 2022-03-21 ENCOUNTER — Ambulatory Visit: Payer: Medicare HMO | Admitting: Cardiovascular Disease

## 2022-04-07 ENCOUNTER — Other Ambulatory Visit: Payer: Self-pay | Admitting: Cardiovascular Disease

## 2022-04-07 NOTE — Telephone Encounter (Signed)
Please contact pt for future appointment. Pt overdue for 6 month f/u.  

## 2022-04-12 ENCOUNTER — Encounter: Payer: Self-pay | Admitting: Cardiology

## 2022-04-12 ENCOUNTER — Ambulatory Visit: Payer: Medicare HMO | Attending: Cardiovascular Disease | Admitting: Cardiology

## 2022-04-12 VITALS — BP 118/66 | HR 70 | Ht 60.0 in | Wt 169.6 lb

## 2022-04-12 DIAGNOSIS — I255 Ischemic cardiomyopathy: Secondary | ICD-10-CM | POA: Diagnosis not present

## 2022-04-12 DIAGNOSIS — I5042 Chronic combined systolic (congestive) and diastolic (congestive) heart failure: Secondary | ICD-10-CM | POA: Diagnosis not present

## 2022-04-12 DIAGNOSIS — Z95810 Presence of automatic (implantable) cardiac defibrillator: Secondary | ICD-10-CM | POA: Diagnosis not present

## 2022-04-12 DIAGNOSIS — E785 Hyperlipidemia, unspecified: Secondary | ICD-10-CM | POA: Diagnosis not present

## 2022-04-12 DIAGNOSIS — I447 Left bundle-branch block, unspecified: Secondary | ICD-10-CM | POA: Diagnosis not present

## 2022-04-12 DIAGNOSIS — I4719 Other supraventricular tachycardia: Secondary | ICD-10-CM | POA: Diagnosis not present

## 2022-04-12 DIAGNOSIS — I10 Essential (primary) hypertension: Secondary | ICD-10-CM

## 2022-04-12 DIAGNOSIS — I25118 Atherosclerotic heart disease of native coronary artery with other forms of angina pectoris: Secondary | ICD-10-CM

## 2022-04-12 NOTE — Progress Notes (Unsigned)
Cardiology Clinic Note   Patient Name: Dana [name] Date of Encounter: 04/13/2022  Primary Care Provider:  Marolyn Haller, MD Primary Cardiologist:  None  Patient Profile    76 year old female with a history of coronary artery disease status post CABG, HFrEF/ICM, hyperlipidemia, chronic left bundle branch block, s/p PPM/ICD insertion, who is here today for follow-up.  Past Medical History    Past Medical History:  Diagnosis Date   CHF (congestive heart failure) (HCC)    Complication of anesthesia    "difficulty waking up, it was all day long"   Coronary artery disease    Hyperlipidemia    LBBB (left bundle branch block)    Psoriasis    Past Surgical History:  Procedure Laterality Date   BIV ICD INSERTION CRT-D N/A 10/05/2021   Procedure: BIV ICD INSERTION CRT-D;  Surgeon: Lanier Prude, MD;  Location: [facility] INVASIVE CV LAB;  Service: Cardiovascular;  Laterality: N/A;   CARDIAC CATHETERIZATION  02/17/2012   CORONARY ARTERY BYPASS GRAFT  02/23/2012   Procedure: CORONARY ARTERY BYPASS GRAFTING (CABG);  Surgeon: Loreli Slot, MD;  Location: [facility] OR;  Service: Open Heart Surgery;  Laterality: N/A;  coronary artery bypass graft on pump times two utlizing left internal mammary artery and right greater saphenous vein harvested endoscopically, transesophageal echocardiogram    NO PAST SURGERIES     RIGHT/LEFT HEART CATH AND CORONARY/GRAFT ANGIOGRAPHY N/A 07/30/2018   Procedure: RIGHT/LEFT HEART CATH AND CORONARY/GRAFT ANGIOGRAPHY;  Surgeon: Iran Ouch, MD;  Location: [facility] INVASIVE CV LAB;  Service: Cardiovascular;  Laterality: N/A;   TEE WITHOUT CARDIOVERSION  02/22/2012   Procedure: TRANSESOPHAGEAL ECHOCARDIOGRAM (TEE);  Surgeon: Dolores Patty, MD;  Location: [facility] ENDOSCOPY;  Service: Cardiovascular;  Laterality: N/A;    Allergies  Allergies  Allergen Reactions   Bevespi Aerosphere [Glycopyrrolate-Formoterol] Hives and Rash    History of Present  Illness    Dana [name] is a 76 year old female with a previously mentioned past medical history of coronary artery disease status post coronary artery bypass grafting in 01/2012, HFrEF/ICM with 2013 TEE showing EF 15-20% and MR, current smoker, hyperlipidemia, chronic left bundle branch block, and recent PPM/ICD insertion.  She presented to [facility] in October 2013 with an LVEF of 15-20% on TEE and cardiac catheterization showing severe proximal LAD and ostial diagonal disease and moderate proximal left circumflex disease.  She was transferred to [facility] in [place] and underwent coronary artery bypass grafting x 2 on 02/19/2012 with epicardial pacing lead on the posterior lateral wall in the event she needed BiV pacer in the future.  She had repeat heart catheterization in 07/2018 with patent grafts and LIMA to LAD, SVG to diagonal.  LAD received with antegrade flow in the LIMA was not atretic.  Right heart cath was mildly elevated filling pressures, PCWP 13 mmHg, PTH 38, left circumflex 80% PCI was possible by Dr. Kirke Corin but did not feel would improve EF.  Echocardiogram revealed LVEF of 20%.  LV severely dilated.  She presented to the emergency department 12/2020 with increased dyspnea and lower extremity edema she was admitted for exacerbation of COPD with acute systolic congestive heart failure with IV diuresis needed.  She had ran out of her stopped several of her cardiac medications at that time.  Echocardiogram revealed LVEF of 10-15%, global hypokinesis, severe MR and mild TR.  It was suspected she had nonischemic cardiomyopathy in the setting of LV dyssynchrony in the setting of LBBB.  She had several witnessed atrial  tachycardia runs and was started on amiodarone.  She was thought not a great candidate for advanced care due to noncompliance with medications.  She was not a candidate for LVAD given CKD, and due to his CKD stage III no ACE/ARB/ARNI/MRA.[name]  She follows with EP on 09/18/2021 for  ischemic cardiomyopathy with an LVEF of 50% and NYHA class III CHF and coronary artery disease.  At that time they decided to implant a CRT-D.  She underwent BiV ICD insertion CRT-D on 10/05/2021 without incident was subsequently discharged home on 10/06/2021.  She was last seen in clinic on 01/12/2022 by Dr. Lalla Brothers.  At that time she was scheduled for repeat echocardiogram 6 to 8 weeks postprocedure and to continue with remote monitoring and she will follow-up with EP in 1 year.  Post device implant echocardiogram revealed LVEF 25-30% (was a significant increase)Mild to moderate mitral valve regurgitation.  She returns to clinic today accompanied by her daughter.  She stated overall she has been doing fairly well.  She does have concerns about her current medications as she states the pharmacy has placed her antiarrhythmic on hold.  She also is curious today if she needs to continue on all of the other medications that she was on since her heart function has improved.  She does complain of some tenderness and discomfort at the insertion site of her pacer defibrillator.  She has some occasional shortness of breath and fatigue but denies any chest pain or peripheral edema, lightheadedness, syncope/near syncope.  She also denies any recent hospitalizations or visits to the emergency department.  Home Medications    Current Outpatient Medications  Medication Sig Dispense Refill   acetaminophen (TYLENOL) 325 MG tablet Take 2 tablets (650 mg total) by mouth every 4 (four) hours as needed for headache or mild pain.     albuterol (VENTOLIN HFA) 108 (90 Base) MCG/ACT inhaler TAKE 2 PUFFS BY MOUTH EVERY 6 HOURS AS NEEDED FOR WHEEZE OR SHORTNESS OF BREATH 6.7 each 3   aspirin 81 MG chewable tablet Chew 1 tablet (81 mg total) by mouth daily. 30 tablet 5   atorvastatin (LIPITOR) 80 MG tablet TAKE 1 TABLET BY MOUTH DAILY 90 tablet 1   carvedilol (COREG) 3.125 MG tablet TAKE 1 TABLET BY MOUTH TWICE A DAY WITH A MEAL  180 tablet 3   empagliflozin (JARDIANCE) 10 MG TABS tablet Take 1 tablet (10 mg total) by mouth daily. 30 tablet 4   isosorbide mononitrate (IMDUR) 30 MG 24 hr tablet TAKE 1 TABLET BY MOUTH EVERY DAY 90 tablet 2   sacubitril-valsartan (ENTRESTO) 24-26 MG Take 1 tablet by mouth 2 (two) times daily.     Tiotropium Bromide-Olodaterol (STIOLTO RESPIMAT) 2.5-2.5 MCG/ACT AERS INHALE 2 PUFFS BY MOUTH INTO THE LUNGS DAILY 4 g 7   torsemide (DEMADEX) 20 MG tablet Take 1 tablet (20 mg total) by mouth 4 (four) times a week. Take Monday, Wednesday, Friday, Saturday. 50 tablet 3   amiodarone (PACERONE) 200 MG tablet TAKE 1 TABLET BY MOUTH EVERY DAY (Patient not taking: Reported on 04/12/2022) 90 tablet 3   hydrALAZINE (APRESOLINE) 25 MG tablet Take 1 tablet (25 mg total) by mouth every 8 (eight) hours. (Patient not taking: Reported on 04/12/2022) 270 tablet 1   nicotine polacrilex (NICORETTE) 4 MG gum Take 1 each (4 mg total) by mouth as needed for smoking cessation. (Patient not taking: Reported on 04/12/2022) 100 tablet 0   No current facility-administered medications for this visit.     Family  History    Family History  Problem Relation Age of Onset   Heart disease Father    She indicated that the status of her father is unknown.  Social History    Social History   Socioeconomic History   Marital status: Divorced    Spouse name: Not on file   Number of children: Not on file   Years of education: Not on file   Highest education level: Not on file  Occupational History   Not on file  Tobacco Use   Smoking status: Some Days    Types: Cigarettes    Last attempt to quit: 11/30/2020    Years since quitting: 1.3   Smokeless tobacco: Never   Tobacco comments:    Smoked but cut down when started to get sick  Vaping Use   Vaping Use: Never used  Substance and Sexual Activity   Alcohol use: No   Drug use: No   Sexual activity: Not on file  Other Topics Concern   Not on file  Social History  Narrative   Not on file   Social Determinants of Health   Financial Resource Strain: Not on file  Food Insecurity: Not on file  Transportation Needs: Not on file  Physical Activity: Not on file  Stress: Not on file  Social Connections: Not on file  Intimate Partner Violence: Not on file     Review of Systems    General:  No chills, fever, night sweats or weight changes.  Endorses fatigue  Cardiovascular:  No chest pain, endorses occasional dyspnea on exertion, but denies edema, orthopnea, palpitations, paroxysmal nocturnal dyspnea. Dermatological: No rash, lesions/masses Respiratory: No cough, endorses occasional exertional dyspnea Urologic: No hematuria, dysuria Abdominal:   No nausea, vomiting, diarrhea, bright red blood per rectum, melena, or hematemesis Neurologic:  No visual changes, wkns, changes in mental status. All other systems reviewed and are otherwise negative except as noted above.   Physical Exam    VS:  BP 118/66   Pulse 70   Ht 5' (1.524 m)   Wt 169 lb 9.6 oz (76.9 kg)   SpO2 97%   BMI 33.12 kg/m  , BMI Body mass index is 33.12 kg/m.     GEN: Well nourished, well developed, in no acute distress. HEENT: normal. Neck: Supple, no JVD, carotid bruits, or masses. Cardiac: RRR, II/ VI systolic murmur, without rubs, or gallops. No clubbing, cyanosis, edema.  Radials 2+/PT 2+ and equal bilaterally.  Respiratory:  Respirations regular and unlabored, clear to auscultation bilaterally. GI: Soft, nontender, nondistended, BS + x 4. MS: no deformity or atrophy. Skin: warm and dry, no rash.  Neuro:  Strength and sensation are intact. Psych: Normal affect.  Accessory Clinical Findings    ECG personally reviewed by me today-A and V paced at a rate of 70- No acute changes  Lab Results  Component Value Date   WBC 6.8 09/24/2021   HGB 13.3 09/24/2021   HCT 42.5 09/24/2021   MCV 102.2 (H) 09/24/2021   PLT 271 09/24/2021   Lab Results  Component Value Date    CREATININE 1.55 (H) 09/24/2021   BUN 26 (H) 09/24/2021   NA 138 09/24/2021   K 4.1 09/24/2021   CL 100 09/24/2021   CO2 30 09/24/2021   Lab Results  Component Value Date   ALT 20 02/05/2021   AST 40 02/05/2021   ALKPHOS 97 02/05/2021   BILITOT 0.5 02/05/2021   Lab Results  Component Value Date  CHOL 151 08/19/2021   HDL 64 08/19/2021   LDLCALC 52 08/19/2021   LDLDIRECT 124 (H) 02/05/2021   TRIG 222 (H) 08/19/2021   CHOLHDL 2.4 08/19/2021    Lab Results  Component Value Date   HGBA1C 5.5 07/26/2018    Assessment & Plan   1.  Coronary artery disease of the native artery with stable angina and ischemic cardiomyopathy.  Ischemic workup in 2020 shows medical management.  She denies any current anginal symptoms.  Continues to smoke in small amounts.  No ischemic changes noted on EKG.  She has been continued on aspirin 81 mg daily atorvastatin 80 mg daily, and Imdur 30 mg daily.  2.  Chronic systolic congestive heart failure with a last LVEF improved to 25/30% after recent insertion of BiV CRT-D.  She is continued on GDMT of Entresto 24/26 mg twice daily, torsemide 20 mg 4 times a week, Jardiance 10 mg daily, carvedilol 3.125 mg twice daily.  Unable to escalate GDMT today due to kidney function.  Encouraged to continue with daily weights, low-sodium, and monitor fluid intake.  3.  Hyperlipidemia with LDL goal of less than 70.  LDL 52.  Continued on atorvastatin 80 mg daily.  4.  History of atrial tachycardia with rare palpitations noted.  She is continued on amiodarone 200 mg daily.  She is not currently out of this medication for approximately a week with pharmacy called and she has a prescription ready for pickup.  5.  Chronic kidney disease with last serum creatinine of 1.55.  She is continued on her current medication regimen without changes made today.  6.  COPD with a history of tobacco use as she endorses continued to smoke small amounts.  She is continued on her inhalers  with total cessation recommended for tobacco use.  7.  Hypertension with blood pressure today 118/66.  Blood pressure remained stable.  She is continued on her current medication regimen without changes today.  8.  Disposition patient return to clinic to see MD/APP in 6 months or sooner if needed.  She has been encouraged to keep all follow-up with the EP as well as her remote device checks for her ICD.  Dana Mcmenamy, NP 04/13/2022, 1:20 PM

## 2022-04-12 NOTE — Patient Instructions (Signed)
Medication Instructions:  - Your physician recommends that you continue on your current medications as directed. Please refer to the Current Medication list given to you today.  Please go pick up your Amiodarone prescription later today at the pharmacy.  *If you need a refill on your cardiac medications before your next appointment, please call your pharmacy*   Lab Work: - none ordered  If you have labs (blood work) drawn today and your tests are completely normal, you will receive your results only by: MyChart Message (if you have MyChart) OR A paper copy in the mail If you have any lab test that is abnormal or we need to change your treatment, we will call you to review the results.   Testing/Procedures: - none ordered   Follow-Up: At [facility], you and your health needs are our priority.  As part of our continuing mission to provide you with exceptional heart care, we have created designated Provider Care Teams.  These Care Teams include your primary Cardiologist (physician) and Advanced Practice Providers (APPs -  Physician Assistants and Nurse Practitioners) who all work together to provide you with the care you need, when you need it.  We recommend signing up for the patient portal called "MyChart".  Sign up information is provided on this After Visit Summary.  MyChart is used to connect with patients for Virtual Visits (Telemedicine).  Patients are able to view lab/test results, encounter notes, upcoming appointments, etc.  Non-urgent messages can be sent to your provider as well.   To learn more about what you can do with MyChart, go to [email or web address].    Your next appointment:   6 month(s)  The format for your next appointment:   In Person  Provider:   Julien Nordmann, MD    Other Instructions N/a  Important Information About Sugar

## 2022-04-20 ENCOUNTER — Ambulatory Visit (INDEPENDENT_AMBULATORY_CARE_PROVIDER_SITE_OTHER): Payer: Medicare HMO

## 2022-04-20 DIAGNOSIS — I255 Ischemic cardiomyopathy: Secondary | ICD-10-CM

## 2022-04-20 LAB — CUP PACEART REMOTE DEVICE CHECK
Battery Remaining Longevity: 41 mo
Battery Remaining Percentage: 86 %
Battery Voltage: 3.01 V
Brady Statistic AP VP Percent: 96 %
Brady Statistic AP VS Percent: 1 %
Brady Statistic AS VP Percent: 3.7 %
Brady Statistic AS VS Percent: 1 %
Brady Statistic RA Percent Paced: 96 %
Date Time Interrogation Session: 20231220020015
HighPow Impedance: 63 Ohm
HighPow Impedance: 63 Ohm
Implantable Lead Connection Status: 753985
Implantable Lead Connection Status: 753985
Implantable Lead Connection Status: 753985
Implantable Lead Implant Date: 20131024
Implantable Lead Implant Date: 20230606
Implantable Lead Implant Date: 20230606
Implantable Lead Location: 753859
Implantable Lead Location: 753860
Implantable Lead Location: 753862
Implantable Lead Model: 5071
Implantable Pulse Generator Implant Date: 20230606
Lead Channel Impedance Value: 290 Ohm
Lead Channel Impedance Value: 480 Ohm
Lead Channel Impedance Value: 600 Ohm
Lead Channel Pacing Threshold Amplitude: 0.625 V
Lead Channel Pacing Threshold Amplitude: 0.75 V
Lead Channel Pacing Threshold Amplitude: 2 V
Lead Channel Pacing Threshold Pulse Width: 0.5 ms
Lead Channel Pacing Threshold Pulse Width: 0.5 ms
Lead Channel Pacing Threshold Pulse Width: 1 ms
Lead Channel Sensing Intrinsic Amplitude: 12 mV
Lead Channel Sensing Intrinsic Amplitude: 3.4 mV
Lead Channel Setting Pacing Amplitude: 1.75 V
Lead Channel Setting Pacing Amplitude: 2 V
Lead Channel Setting Pacing Amplitude: 3 V
Lead Channel Setting Pacing Pulse Width: 0.5 ms
Lead Channel Setting Pacing Pulse Width: 1 ms
Lead Channel Setting Sensing Sensitivity: 0.5 mV
Pulse Gen Serial Number: 5538587
Zone Setting Status: 755011

## 2022-04-25 ENCOUNTER — Other Ambulatory Visit: Payer: Self-pay | Admitting: Cardiovascular Disease

## 2022-05-03 ENCOUNTER — Other Ambulatory Visit: Payer: Self-pay | Admitting: Student

## 2022-05-03 DIAGNOSIS — I5022 Chronic systolic (congestive) heart failure: Secondary | ICD-10-CM

## 2022-05-05 ENCOUNTER — Other Ambulatory Visit: Payer: Self-pay | Admitting: Internal Medicine

## 2022-05-13 NOTE — Progress Notes (Signed)
Remote ICD transmission.   

## 2022-06-06 ENCOUNTER — Encounter: Payer: Medicare HMO | Admitting: Student

## 2022-06-06 ENCOUNTER — Ambulatory Visit: Payer: Medicare HMO

## 2022-07-10 ENCOUNTER — Other Ambulatory Visit: Payer: Self-pay | Admitting: Internal Medicine

## 2022-07-20 ENCOUNTER — Ambulatory Visit (INDEPENDENT_AMBULATORY_CARE_PROVIDER_SITE_OTHER): Payer: Medicare HMO

## 2022-07-20 DIAGNOSIS — I255 Ischemic cardiomyopathy: Secondary | ICD-10-CM

## 2022-07-21 LAB — CUP PACEART REMOTE DEVICE CHECK
Battery Remaining Longevity: 39 mo
Battery Remaining Percentage: 81 %
Battery Voltage: 2.98 V
Brady Statistic AP VP Percent: 92 %
Brady Statistic AP VS Percent: 1 %
Brady Statistic AS VP Percent: 7.3 %
Brady Statistic AS VS Percent: 1 %
Brady Statistic RA Percent Paced: 93 %
Date Time Interrogation Session: 20240320020034
HighPow Impedance: 65 Ohm
HighPow Impedance: 65 Ohm
Implantable Lead Connection Status: 753985
Implantable Lead Connection Status: 753985
Implantable Lead Connection Status: 753985
Implantable Lead Implant Date: 20131024
Implantable Lead Implant Date: 20230606
Implantable Lead Implant Date: 20230606
Implantable Lead Location: 753859
Implantable Lead Location: 753860
Implantable Lead Location: 753862
Implantable Lead Model: 5071
Implantable Pulse Generator Implant Date: 20230606
Lead Channel Impedance Value: 290 Ohm
Lead Channel Impedance Value: 430 Ohm
Lead Channel Impedance Value: 640 Ohm
Lead Channel Pacing Threshold Amplitude: 0.625 V
Lead Channel Pacing Threshold Amplitude: 0.75 V
Lead Channel Pacing Threshold Amplitude: 2 V
Lead Channel Pacing Threshold Pulse Width: 0.5 ms
Lead Channel Pacing Threshold Pulse Width: 0.5 ms
Lead Channel Pacing Threshold Pulse Width: 1 ms
Lead Channel Sensing Intrinsic Amplitude: 12 mV
Lead Channel Sensing Intrinsic Amplitude: 2.2 mV
Lead Channel Setting Pacing Amplitude: 1.625
Lead Channel Setting Pacing Amplitude: 2 V
Lead Channel Setting Pacing Amplitude: 3 V
Lead Channel Setting Pacing Pulse Width: 0.5 ms
Lead Channel Setting Pacing Pulse Width: 1 ms
Lead Channel Setting Sensing Sensitivity: 0.5 mV
Pulse Gen Serial Number: 5538587
Zone Setting Status: 755011

## 2022-08-24 NOTE — Progress Notes (Signed)
Remote ICD transmission.   

## 2022-08-30 ENCOUNTER — Other Ambulatory Visit: Payer: Self-pay | Admitting: Internal Medicine

## 2022-08-30 ENCOUNTER — Other Ambulatory Visit: Payer: Self-pay | Admitting: Cardiovascular Disease

## 2022-10-16 NOTE — Progress Notes (Signed)
Evaluation Performed:  Follow-up visit  Date:  10/17/2022   ID:  Dana [name], DOB [birth year], MRN [id number]  Patient Location:  [address] [place] [address]   Provider location:   [facility], [place] office  PCP:  Patient, No Pcp Per  Cardiologist:  Fonnie Mu  Chief Complaint  Patient presents with   6 month follow up    Patient c/o shortness of breath with over exertion. Medications reviewed by the patient verbally.      History of Present Illness:    Dana [name] is a 77 y.o. female  past medical history of smoking, anxiety attacks ,  [facility] in October 2013 with systolic CHF, ejection fraction 25% severe left ventricular dysfunction with an EF of 15% on echocardiogram,  cardiac catheterization showing severe proximal LAD and ostial diagonal disease, moderate proximal left circumflex disease,  transferred to [facility], had coronary bypass grafting x2 on 02/23/2012  with  epicardial pacemaker lead on the posterior lateral wall  Lost to follow-up/medication noncompliance In the hospital March 2020 exacerbation of her systolic CHF Catheterization March 2020 Still smoking, "very rare" Ejection fraction 25 to 30% in October 2023 Chronic kidney disease creatinine 1.5 Who f/u for acute on chronic diastolic and systolic CHF  Presents with her daughter on today's visit Last seen by myself in person in May 2023 Last seen by one of our providers December 2023 Seen by EP September 2023 CRT-D implant 10/05/2021  system includes a tunneled LV epicardial lead.   Continues to smoke  Arthritis, "can't walk", legs hurt, back pain Gaining weight  Echocardiogram October 2023 EF 25 to 30%, up from 10 to 15% in September 2022  RV function mildly reduced, mild to moderate MR  Meds include: Confirmed by daughter [name] 24/26 twice daily, Jardiance 10, Coreg 3.125 twice daily Hydralazine, imdur Torsemide 20 mg 4 days a  week Amiodarone 200 daily  No recent hospitalizations Recent ICD downloads reviewed, no significant arrhythmia  Lab work reviewed from last year Total chol 151, LDL 52 No labs in one year  EKG personally reviewed by myself on todays visit Paced rhythm rate 70 bpm  Other past medical history reviewed Cath 07/2018, reviewed Mid LM to Dist LM lesion is 20% stenosed. Prox LAD lesion is 80% stenosed. Ost 1st Diag lesion is 80% stenosed. Mid Cx lesion is 80% stenosed. SVG and is normal in caliber. The graft exhibits no disease. LIMA and is normal in caliber. The graft exhibits no disease.   1.  Significant two-vessel coronary artery disease involving proximal LAD at the bifurcation of first diagonal as well as mid left circumflex.  Patent grafts including LIMA to LAD and SVG to diagonal.  The LAD gets mostly antegrade flow but in spite of that the LIMA is not atretic.  Left circumflex distribution area is medium in size. 2.  Right heart catheterization showed mildly elevated filling pressures with pulmonary wedge pressure of 13 mmHg, mild pulmonary hypertension at 38 over 40 mmHg and severely reduced cardiac output at 3.13 with an index of 1.81.   No need to revascularize the LAD given that the LIMA is patent.  PCI of the left circumflex is possible but I doubt it will make significant difference on ejection fraction.  This should be reserved for refractory angina.  Discharge from the hospital July 30, 2018 for acute on chronic diastolic and systolic CHF, COPD exacerbation   Echocardiogram 02/17/2012 showed  ejection fraction less than 25%, severe global hypokinesis  Cardiac cath 02/17/2012 showing severe proximal LAD and ostial diagonal disease, bifurcation disease. Estimated at 95%. Moderate proximal left circumflex disease, small to moderate size vessel. Severely depressed ejection fraction estimated at 25%.   Prior CV studies:   The following studies were reviewed  today:  TTE 07/26/2018 1. The left ventricle has severely reduced systolic function, with an ejection fraction < 20%. The cavity size was severely dilated. Global hypokinesis, Concern for severe hypokinesis/possible akinesis of the anterior/anterseptal wall/apical. Unable to  determine diastolic parameters  2. The right ventricle has mildly reduced systolic function. The cavity was normal. There is no increase in right ventricular wall thickness. Right ventricular systolic pressure is mildly elevated with an estimated pressure of 37.7 mmHg.  3. The aortic valve is grossly normal Moderate calcification of the aortic valve. Aortic valve regurgitation was not assessed by color flow Doppler.  4. Mitral valve regurgitation is moderate  5. Left atrial size was moderately dilated.    Cath 07/30/2018 Mid LM to Dist LM lesion is 20% stenosed. Prox LAD lesion is 80% stenosed. Ost 1st Diag lesion is 80% stenosed. Mid Cx lesion is 80% stenosed. SVG and is normal in caliber. The graft exhibits no disease. LIMA and is normal in caliber. The graft exhibits no disease.    1.  Significant two-vessel coronary artery disease involving proximal LAD at the bifurcation of first diagonal as well as mid left circumflex.  Patent grafts including LIMA to LAD and SVG to diagonal.  The LAD gets mostly antegrade flow but in spite of that the LIMA is not atretic.  Left circumflex distribution area is medium in size. 2.  Right heart catheterization showed mildly elevated filling pressures with pulmonary wedge pressure of 13 mmHg, mild pulmonary hypertension at 38 over 40 mmHg and severely reduced cardiac output at 3.13 with an index of 1.81.   Recommendations: No need to revascularize the LAD given that the LIMA is patent.   PCI of the left circumflex is possible but I doubt it will make significant difference on ejection fraction.  This should be reserved for refractory angina.  Past Medical History:  Diagnosis Date    CHF (congestive heart failure) (HCC)    Complication of anesthesia    "difficulty waking up, it was all day long"   Coronary artery disease    Hyperlipidemia    LBBB (left bundle branch block)    Psoriasis    Past Surgical History:  Procedure Laterality Date   BIV ICD INSERTION CRT-D N/A 10/05/2021   Procedure: BIV ICD INSERTION CRT-D;  Surgeon: Lanier Prude, MD;  Location: [facility] INVASIVE CV LAB;  Service: Cardiovascular;  Laterality: N/A;   CARDIAC CATHETERIZATION  02/17/2012   CORONARY ARTERY BYPASS GRAFT  02/23/2012   Procedure: CORONARY ARTERY BYPASS GRAFTING (CABG);  Surgeon: Loreli Slot, MD;  Location: [facility] OR;  Service: Open Heart Surgery;  Laterality: N/A;  coronary artery bypass graft on pump times two utlizing left internal mammary artery and right greater saphenous vein harvested endoscopically, transesophageal echocardiogram    NO PAST SURGERIES     RIGHT/LEFT HEART CATH AND CORONARY/GRAFT ANGIOGRAPHY N/A 07/30/2018   Procedure: RIGHT/LEFT HEART CATH AND CORONARY/GRAFT ANGIOGRAPHY;  Surgeon: Iran Ouch, MD;  Location: [facility] INVASIVE CV LAB;  Service: Cardiovascular;  Laterality: N/A;   TEE WITHOUT CARDIOVERSION  02/22/2012   Procedure: TRANSESOPHAGEAL ECHOCARDIOGRAM (TEE);  Surgeon: Dolores Patty, MD;  Location: [facility] ENDOSCOPY;  Service: Cardiovascular;  Laterality: N/A;     Current Meds  Medication Sig   acetaminophen (TYLENOL) 325 MG tablet Take 2 tablets (650 mg total) by mouth every 4 (four) hours as needed for headache or mild pain.   albuterol (VENTOLIN HFA) 108 (90 Base) MCG/ACT inhaler TAKE 2 PUFFS BY MOUTH EVERY 6 HOURS AS NEEDED FOR WHEEZE OR SHORTNESS OF BREATH   amiodarone (PACERONE) 200 MG tablet TAKE 1 TABLET BY MOUTH EVERY DAY   aspirin 81 MG chewable tablet Chew 1 tablet (81 mg total) by mouth daily.   atorvastatin (LIPITOR) 80 MG tablet TAKE 1 TABLET BY MOUTH EVERY DAY   carvedilol (COREG) 3.125 MG tablet TAKE 1 TABLET BY MOUTH TWICE A DAY  WITH A MEAL   ENTRESTO 24-26 MG TAKE 1 TABLET BY MOUTH TWICE A DAY   hydrALAZINE (APRESOLINE) 25 MG tablet Take 1 tablet (25 mg total) by mouth every 8 (eight) hours.   isosorbide mononitrate (IMDUR) 30 MG 24 hr tablet TAKE 1 TABLET BY MOUTH EVERY DAY   JARDIANCE 10 MG TABS tablet TAKE 1 TABLET BY MOUTH EVERY DAY   Tiotropium Bromide-Olodaterol (STIOLTO RESPIMAT) 2.5-2.5 MCG/ACT AERS INHALE 2 PUFFS BY MOUTH INTO THE LUNGS DAILY   torsemide (DEMADEX) 20 MG tablet Take 1 tablet (20 mg total) by mouth 4 (four) times a week. Take Monday, Wednesday, Friday, Saturday.     Allergies:   Bevespi aerosphere [glycopyrrolate-formoterol]   Social History   Tobacco Use   Smoking status: Some Days    Types: Cigarettes    Last attempt to quit: 11/30/2020    Years since quitting: 1.8   Smokeless tobacco: Never   Tobacco comments:    Smoked but cut down when started to get sick  Vaping Use   Vaping Use: Never used  Substance Use Topics   Alcohol use: No   Drug use: No     Family Hx: The patient's family history includes Heart disease in her father.  ROS:   Please see the history of present illness.    Review of Systems  Constitutional: Negative.   HENT: Negative.    Respiratory: Negative.    Cardiovascular: Negative.   Gastrointestinal: Negative.   Musculoskeletal:  Positive for back pain.  Neurological: Negative.   Psychiatric/Behavioral: Negative.    All other systems reviewed and are negative.    Labs/Other Tests and Data Reviewed:    Recent Labs: No results found for requested labs within last 365 days.   Recent Lipid Panel Lab Results  Component Value Date/Time   CHOL 151 08/19/2021 04:39 PM   CHOL 123 02/17/2012 03:14 AM   TRIG 222 (H) 08/19/2021 04:39 PM   TRIG 99 02/17/2012 03:14 AM   HDL 64 08/19/2021 04:39 PM   HDL 30 (L) 02/17/2012 03:14 AM   CHOLHDL 2.4 08/19/2021 04:39 PM   CHOLHDL 3.2 07/27/2018 04:17 AM   LDLCALC 52 08/19/2021 04:39 PM   LDLCALC 73 02/17/2012  03:14 AM   LDLDIRECT 124 (H) 02/05/2021 12:56 PM    Wt Readings from Last 3 Encounters:  10/17/22 173 lb 6 oz (78.6 kg)  04/12/22 169 lb 9.6 oz (76.9 kg)  01/12/22 162 lb 6 oz (73.7 kg)     Exam:    BP 106/60 (BP Location: Left Arm, Patient Position: Sitting, Cuff Size: Normal)   Pulse 70   Ht 5' (1.524 m)   Wt 173 lb 6 oz (78.6 kg)   SpO2 93%   BMI 33.86 kg/m  Constitutional:  oriented to  person, place, and time. No distress.  HENT:  Head: Grossly normal Eyes:  no discharge. No scleral icterus.  Neck: No JVD, no carotid bruits  Cardiovascular: Regular rate and rhythm, no murmurs appreciated Pulmonary/Chest: Clear to auscultation bilaterally, no wheezes or rails Abdominal: Soft.  no distension.  no tenderness.  Musculoskeletal: Normal range of motion Neurological:  normal muscle tone. Coordination normal. No atrophy Skin: Skin warm and dry, diffuse psoriasis chest, arms legs Psychiatric: normal affect, pleasant   ASSESSMENT & PLAN:    Coronary artery disease of native artery of native heart with stable angina pectoris (HCC) Ischemic work-up in 2020, medical management recommended at that time, had significant circumflex disease but recommendation not to treat by interventionalist Smoking cessation recommended No diabetes Lipid panel ordered, continue statin  Chronic systolic (congestive) heart failure (HCC) Ejection fraction 25 to 30% on most recent echo Daughter helps with medications as detailed above  Cardiomyopathy, ischemic Smoking cessation recommended Compliance with her statin ICD in place, followed by EP  Centrilobular emphysema (HCC) Smoking cessation recommended, no recent COPD exacerbation   Total encounter time more than 30 minutes  Greater than 50% was spent in counseling and coordination of care with the patient    Signed, Julien Nordmann, MD  10/17/2022 2:52 PM    [facility] [address], [place], [place] [address]

## 2022-10-17 ENCOUNTER — Encounter: Payer: Self-pay | Admitting: Cardiovascular Disease

## 2022-10-17 ENCOUNTER — Ambulatory Visit: Payer: Medicare HMO | Attending: Cardiovascular Disease | Admitting: Cardiovascular Disease

## 2022-10-17 VITALS — BP 106/60 | HR 70 | Ht 60.0 in | Wt 173.4 lb

## 2022-10-17 DIAGNOSIS — I255 Ischemic cardiomyopathy: Secondary | ICD-10-CM

## 2022-10-17 DIAGNOSIS — I10 Essential (primary) hypertension: Secondary | ICD-10-CM | POA: Diagnosis not present

## 2022-10-17 DIAGNOSIS — Z79899 Other long term (current) drug therapy: Secondary | ICD-10-CM | POA: Diagnosis not present

## 2022-10-17 DIAGNOSIS — I340 Nonrheumatic mitral (valve) insufficiency: Secondary | ICD-10-CM

## 2022-10-17 DIAGNOSIS — I4719 Other supraventricular tachycardia: Secondary | ICD-10-CM

## 2022-10-17 DIAGNOSIS — I25118 Atherosclerotic heart disease of native coronary artery with other forms of angina pectoris: Secondary | ICD-10-CM | POA: Diagnosis not present

## 2022-10-17 DIAGNOSIS — Z95810 Presence of automatic (implantable) cardiac defibrillator: Secondary | ICD-10-CM

## 2022-10-17 DIAGNOSIS — I447 Left bundle-branch block, unspecified: Secondary | ICD-10-CM | POA: Diagnosis not present

## 2022-10-17 DIAGNOSIS — E785 Hyperlipidemia, unspecified: Secondary | ICD-10-CM | POA: Diagnosis not present

## 2022-10-17 DIAGNOSIS — I5042 Chronic combined systolic (congestive) and diastolic (congestive) heart failure: Secondary | ICD-10-CM

## 2022-10-17 MED ORDER — EMPAGLIFLOZIN 10 MG PO TABS: 10.0000 mg | ORAL_TABLET | Freq: Every day | ORAL | 3 refills | Status: DC

## 2022-10-17 NOTE — Patient Instructions (Addendum)
Medication Instructions:  No changes  If you need a refill on your cardiac medications before your next appointment, please call your pharmacy.   Lab work: [facility]: CMP, CBC, lipids  You will need to be fasting.  Testing/Procedures: No new testing needed  Follow-Up: At [facility], you and your health needs are our priority.  As part of our continuing mission to provide you with exceptional heart care, we have created designated Provider Care Teams.  These Care Teams include your primary Cardiologist (physician) and Advanced Practice Providers (APPs -  Physician Assistants and Nurse Practitioners) who all work together to provide you with the care you need, when you need it.  You will need a follow up appointment in 6 months, APP ok  Providers on your designated Care Team:   Nicolasa Ducking, NP Eula Listen, PA-C Cadence Fransico Michael, [place]  COVID-19 Vaccine Information can be found at: [email or web address] For questions related to vaccine distribution or appointments, please email [email or web address] or call [phone].

## 2022-10-18 ENCOUNTER — Other Ambulatory Visit: Payer: Self-pay | Admitting: Cardiovascular Disease

## 2022-10-18 DIAGNOSIS — Z79899 Other long term (current) drug therapy: Secondary | ICD-10-CM | POA: Diagnosis not present

## 2022-10-19 ENCOUNTER — Ambulatory Visit (INDEPENDENT_AMBULATORY_CARE_PROVIDER_SITE_OTHER): Payer: Medicare HMO

## 2022-10-19 DIAGNOSIS — I255 Ischemic cardiomyopathy: Secondary | ICD-10-CM

## 2022-10-19 DIAGNOSIS — I447 Left bundle-branch block, unspecified: Secondary | ICD-10-CM

## 2022-10-19 LAB — CBC
Hematocrit: 39.5 % (ref 34.0–46.6)
Hemoglobin: 12.9 g/dL (ref 11.1–15.9)
MCH: 32.2 pg (ref 26.6–33.0)
MCHC: 32.7 g/dL (ref 31.5–35.7)
MCV: 99 fL — ABNORMAL HIGH (ref 79–97)
Platelets: 259 10*3/uL (ref 150–450)
RBC: 4.01 x10E6/uL (ref 3.77–5.28)
RDW: 13.2 % (ref 11.7–15.4)
WBC: 6.3 10*3/uL (ref 3.4–10.8)

## 2022-10-19 LAB — COMPREHENSIVE METABOLIC PANEL
ALT: 12 IU/L (ref 0–32)
AST: 19 IU/L (ref 0–40)
Albumin: 4.8 g/dL (ref 3.8–4.8)
Alkaline Phosphatase: 76 IU/L (ref 44–121)
BUN/Creatinine Ratio: 20 (ref 12–28)
BUN: 38 mg/dL — ABNORMAL HIGH (ref 8–27)
Bilirubin Total: 0.3 mg/dL (ref 0.0–1.2)
CO2: 26 mmol/L (ref 20–29)
Calcium: 9.5 mg/dL (ref 8.7–10.3)
Chloride: 99 mmol/L (ref 96–106)
Creatinine, Ser: 1.88 mg/dL — ABNORMAL HIGH (ref 0.57–1.00)
Globulin, Total: 2.7 g/dL (ref 1.5–4.5)
Glucose: 88 mg/dL (ref 70–99)
Potassium: 4.8 mmol/L (ref 3.5–5.2)
Sodium: 141 mmol/L (ref 134–144)
Total Protein: 7.5 g/dL (ref 6.0–8.5)
eGFR: 27 mL/min/{1.73_m2} — ABNORMAL LOW (ref >59)

## 2022-10-19 LAB — LIPID PANEL
Chol/HDL Ratio: 2.4 ratio (ref 0.0–4.4)
Cholesterol, Total: 148 mg/dL (ref 100–199)
HDL: 61 mg/dL (ref >39)
LDL Chol Calc (NIH): 64 mg/dL (ref 0–99)
Triglycerides: 130 mg/dL (ref 0–149)
VLDL Cholesterol Cal: 23 mg/dL (ref 5–40)

## 2022-10-19 LAB — SPECIMEN STATUS REPORT

## 2022-10-20 LAB — CUP PACEART REMOTE DEVICE CHECK
Battery Remaining Longevity: 37 mo
Battery Remaining Percentage: 76 %
Battery Voltage: 2.96 V
Brady Statistic AP VP Percent: 92 %
Brady Statistic AP VS Percent: 1 %
Brady Statistic AS VP Percent: 8.3 %
Brady Statistic AS VS Percent: 1 %
Brady Statistic RA Percent Paced: 92 %
Date Time Interrogation Session: 20240619020016
HighPow Impedance: 71 Ohm
HighPow Impedance: 71 Ohm
Implantable Lead Connection Status: 753985
Implantable Lead Connection Status: 753985
Implantable Lead Connection Status: 753985
Implantable Lead Implant Date: 20131024
Implantable Lead Implant Date: 20230606
Implantable Lead Implant Date: 20230606
Implantable Lead Location: 753859
Implantable Lead Location: 753860
Implantable Lead Location: 753862
Implantable Lead Model: 5071
Implantable Pulse Generator Implant Date: 20230606
Lead Channel Impedance Value: 290 Ohm
Lead Channel Impedance Value: 380 Ohm
Lead Channel Impedance Value: 610 Ohm
Lead Channel Pacing Threshold Amplitude: 0.625 V
Lead Channel Pacing Threshold Amplitude: 0.625 V
Lead Channel Pacing Threshold Amplitude: 2 V
Lead Channel Pacing Threshold Pulse Width: 0.5 ms
Lead Channel Pacing Threshold Pulse Width: 0.5 ms
Lead Channel Pacing Threshold Pulse Width: 1 ms
Lead Channel Sensing Intrinsic Amplitude: 12 mV
Lead Channel Sensing Intrinsic Amplitude: 3.8 mV
Lead Channel Setting Pacing Amplitude: 1.625
Lead Channel Setting Pacing Amplitude: 2 V
Lead Channel Setting Pacing Amplitude: 3 V
Lead Channel Setting Pacing Pulse Width: 0.5 ms
Lead Channel Setting Pacing Pulse Width: 1 ms
Lead Channel Setting Sensing Sensitivity: 0.5 mV
Pulse Gen Serial Number: 5538587
Zone Setting Status: 755011

## 2022-10-21 ENCOUNTER — Other Ambulatory Visit: Payer: Self-pay | Admitting: Student

## 2022-10-21 DIAGNOSIS — I5022 Chronic systolic (congestive) heart failure: Secondary | ICD-10-CM

## 2022-11-15 ENCOUNTER — Encounter: Payer: Self-pay | Admitting: Internal Medicine

## 2022-11-15 ENCOUNTER — Ambulatory Visit (INDEPENDENT_AMBULATORY_CARE_PROVIDER_SITE_OTHER): Payer: Medicare HMO | Admitting: Internal Medicine

## 2022-11-15 VITALS — BP 130/70 | HR 70 | Ht 60.0 in | Wt 176.0 lb

## 2022-11-15 DIAGNOSIS — I700 Atherosclerosis of aorta: Secondary | ICD-10-CM | POA: Diagnosis not present

## 2022-11-15 DIAGNOSIS — N184 Chronic kidney disease, stage 4 (severe): Secondary | ICD-10-CM | POA: Insufficient documentation

## 2022-11-15 DIAGNOSIS — M170 Bilateral primary osteoarthritis of knee: Secondary | ICD-10-CM | POA: Diagnosis not present

## 2022-11-15 DIAGNOSIS — L409 Psoriasis, unspecified: Secondary | ICD-10-CM

## 2022-11-15 DIAGNOSIS — J432 Centrilobular emphysema: Secondary | ICD-10-CM | POA: Diagnosis not present

## 2022-11-15 DIAGNOSIS — Z23 Encounter for immunization: Secondary | ICD-10-CM | POA: Diagnosis not present

## 2022-11-15 DIAGNOSIS — I25118 Atherosclerotic heart disease of native coronary artery with other forms of angina pectoris: Secondary | ICD-10-CM | POA: Diagnosis not present

## 2022-11-15 DIAGNOSIS — H9313 Tinnitus, bilateral: Secondary | ICD-10-CM

## 2022-11-15 DIAGNOSIS — R7989 Other specified abnormal findings of blood chemistry: Secondary | ICD-10-CM | POA: Diagnosis not present

## 2022-11-15 DIAGNOSIS — Z95810 Presence of automatic (implantable) cardiac defibrillator: Secondary | ICD-10-CM | POA: Insufficient documentation

## 2022-11-15 NOTE — Progress Notes (Signed)
Date:  11/15/2022   Name:  Dana [name]   DOB:  December 16, 1945   MRN:  [id number]   Chief Complaint: New Patient (Initial Visit), Hearing Problem (Tinnitis, and clogged ears. Having a hard time hearing.), and Back Pain (When bending over too much, her back starts to hurt. Been hurting for over a year. )  Back Pain This is a chronic problem. The current episode started more than 1 year ago. The problem is unchanged. The pain is present in the lumbar spine. The quality of the pain is described as aching. The pain does not radiate. The pain is mild. The symptoms are aggravated by bending. Pertinent negatives include no abdominal pain, bladder incontinence, bowel incontinence, chest pain, fever or headaches. She has tried analgesics (tylenol 500 mg twice a day as needed) for the symptoms.  Otalgia  There is pain in both ears. This is a chronic problem. The current episode started 1 to 4 weeks ago. Associated symptoms include hearing loss and a rash. Pertinent negatives include no abdominal pain, diarrhea or headaches.  Rash This is a chronic (Psoriasis) problem. The problem is unchanged. The rash is diffuse. The rash is characterized by itchiness, scaling and peeling. Associated symptoms include shortness of breath. Pertinent negatives include no diarrhea, fatigue or fever. Past treatments include topical steroids and moisturizer. The treatment provided no relief.  CKD - stage 4 disease, not being followed by nephrology.  Cardiology reduced torsemide to three times per week.    Lab Results  Component Value Date   NA 141 10/18/2022   K 4.8 10/18/2022   CO2 26 10/18/2022   GLUCOSE 88 10/18/2022   BUN 38 (H) 10/18/2022   CREATININE 1.88 (H) 10/18/2022   CALCIUM 9.5 10/18/2022   EGFR 27 (L) 10/18/2022   GFRNONAA 34 (L) 09/24/2021   Lab Results  Component Value Date   CHOL 148 10/18/2022   HDL 61 10/18/2022   LDLCALC 64 10/18/2022   LDLDIRECT 124 (H) 02/05/2021   TRIG 130 10/18/2022    CHOLHDL 2.4 10/18/2022   Lab Results  Component Value Date   TSH 13.600 (H) 02/05/2021   Lab Results  Component Value Date   HGBA1C 5.5 07/26/2018   Lab Results  Component Value Date   WBC 6.3 10/18/2022   HGB 12.9 10/18/2022   HCT 39.5 10/18/2022   MCV 99 (H) 10/18/2022   PLT 259 10/18/2022   Lab Results  Component Value Date   ALT 12 10/18/2022   AST 19 10/18/2022   ALKPHOS 76 10/18/2022   BILITOT 0.3 10/18/2022   No results found for: "25OHVITD2", "25OHVITD3", "VD25OH"   Review of Systems  Constitutional:  Negative for chills, fatigue and fever.  HENT:  Positive for ear pain and hearing loss. Negative for trouble swallowing.   Respiratory:  Positive for shortness of breath. Negative for chest tightness and wheezing.   Cardiovascular:  Negative for chest pain, palpitations and leg swelling.  Gastrointestinal:  Negative for abdominal pain, bowel incontinence, constipation and diarrhea.  Genitourinary:  Negative for bladder incontinence.  Musculoskeletal:  Positive for back pain and gait problem (knee OA).  Skin:  Positive for rash.  Neurological:  Negative for dizziness and headaches.  Psychiatric/Behavioral:  Negative for dysphoric mood. The patient is not nervous/anxious.     Patient Active Problem List   Diagnosis Date Noted   Aortic atherosclerosis (HCC) 11/15/2022   CKD (chronic kidney disease) stage 4, GFR 15-29 ml/min (HCC) 11/15/2022   Primary osteoarthritis of both  knees 11/15/2022   Tinnitus of both ears 11/15/2022   Presence of biventricular AICD 11/15/2022   Ischemic cardiomyopathy 10/05/2021   Atrial tachycardia 07/24/2021   Psoriasis 01/28/2021   NSTEMI (non-ST elevated myocardial infarction) [facility])    Coronary artery disease of native artery of native heart with stable angina pectoris (HCC) 09/09/2018   Centrilobular emphysema (HCC) 09/09/2018   Combined systolic and diastolic congestive heart failure (HCC) 07/25/2018   Mitral regurgitation  02/22/2012   Tobacco abuse 02/18/2012   Left bundle branch block 02/18/2012   Iron deficiency 02/18/2012    Allergies  Allergen Reactions   Bevespi Aerosphere [Glycopyrrolate-Formoterol] Hives and Rash    Past Surgical History:  Procedure Laterality Date   BIV ICD INSERTION CRT-D N/A 10/05/2021   Procedure: BIV ICD INSERTION CRT-D;  Surgeon: Lanier Prude, MD;  Location: [facility] INVASIVE CV LAB;  Service: Cardiovascular;  Laterality: N/A;   CARDIAC CATHETERIZATION  02/17/2012   CORONARY ARTERY BYPASS GRAFT  02/23/2012   Procedure: CORONARY ARTERY BYPASS GRAFTING (CABG);  Surgeon: Loreli Slot, MD;  Location: [facility] OR;  Service: Open Heart Surgery;  Laterality: N/A;  coronary artery bypass graft on pump times two utlizing left internal mammary artery and right greater saphenous vein harvested endoscopically, transesophageal echocardiogram    NO PAST SURGERIES     RIGHT/LEFT HEART CATH AND CORONARY/GRAFT ANGIOGRAPHY N/A 07/30/2018   Procedure: RIGHT/LEFT HEART CATH AND CORONARY/GRAFT ANGIOGRAPHY;  Surgeon: Iran Ouch, MD;  Location: [facility] INVASIVE CV LAB;  Service: Cardiovascular;  Laterality: N/A;   TEE WITHOUT CARDIOVERSION  02/22/2012   Procedure: TRANSESOPHAGEAL ECHOCARDIOGRAM (TEE);  Surgeon: Dolores Patty, MD;  Location: [facility] ENDOSCOPY;  Service: Cardiovascular;  Laterality: N/A;    Social History   Tobacco Use   Smoking status: Some Days    Types: Cigarettes   Smokeless tobacco: Never   Tobacco comments:    Smoked but cut down when started to get sick  Vaping Use   Vaping status: Never Used  Substance Use Topics   Alcohol use: No   Drug use: No     Medication list has been reviewed and updated.  Current Meds  Medication Sig   acetaminophen (TYLENOL) 325 MG tablet Take 2 tablets (650 mg total) by mouth every 4 (four) hours as needed for headache or mild pain.   albuterol (VENTOLIN HFA) 108 (90 Base) MCG/ACT inhaler TAKE 2 PUFFS BY MOUTH EVERY 6 HOURS AS  NEEDED FOR WHEEZE OR SHORTNESS OF BREATH   amiodarone (PACERONE) 200 MG tablet TAKE 1 TABLET BY MOUTH EVERY DAY   aspirin 81 MG chewable tablet Chew 1 tablet (81 mg total) by mouth daily.   atorvastatin (LIPITOR) 80 MG tablet TAKE 1 TABLET BY MOUTH EVERY DAY   carvedilol (COREG) 3.125 MG tablet TAKE 1 TABLET BY MOUTH TWICE A DAY WITH A MEAL   empagliflozin (JARDIANCE) 10 MG TABS tablet Take 1 tablet (10 mg total) by mouth daily.   ENTRESTO 24-26 MG TAKE 1 TABLET BY MOUTH TWICE A DAY   isosorbide mononitrate (IMDUR) 30 MG 24 hr tablet TAKE 1 TABLET BY MOUTH EVERY DAY   Tiotropium Bromide-Olodaterol (STIOLTO RESPIMAT) 2.5-2.5 MCG/ACT AERS INHALE 2 PUFFS BY MOUTH INTO THE LUNGS DAILY   [DISCONTINUED] hydrALAZINE (APRESOLINE) 25 MG tablet Take 1 tablet (25 mg total) by mouth every 8 (eight) hours.       11/15/2022    3:37 PM  GAD 7 : Generalized Anxiety Score  Nervous, Anxious, on Edge 0  Control/stop worrying  0  Worry too much - different things 0  Trouble relaxing 0  Restless 0  Easily annoyed or irritable 0  Afraid - awful might happen 0  Total GAD 7 Score 0  Anxiety Difficulty Not difficult at all       11/15/2022    3:37 PM 08/19/2021    3:54 PM 07/22/2021    2:00 PM  Depression screen PHQ 2/9  Decreased Interest 0 0 0  Down, Depressed, Hopeless 0 0 0  PHQ - 2 Score 0 0 0  Altered sleeping 0 0   Tired, decreased energy 1 0   Change in appetite 1 0   Feeling bad or failure about yourself  0 0   Trouble concentrating 0 0   Moving slowly or fidgety/restless 0 0   Suicidal thoughts 0 0   PHQ-9 Score 2 0   Difficult doing work/chores Not difficult at all Not difficult at all     BP Readings from Last 3 Encounters:  11/15/22 130/70  10/17/22 106/60  04/12/22 118/66    Physical Exam Constitutional:      Appearance: Normal appearance.  HENT:     Right Ear: Tympanic membrane normal.     Left Ear: Tympanic membrane normal.     Ears:     Comments: TM only partially  visible due to tortuous ear canal Minimal cerumen seen Neck:     Vascular: No carotid bruit.  Cardiovascular:     Rate and Rhythm: Normal rate and regular rhythm.  Pulmonary:     Effort: Pulmonary effort is normal.     Breath sounds: No wheezing or rhonchi.  Musculoskeletal:     Cervical back: Normal range of motion.     Lumbar back: No tenderness or bony tenderness. Negative right straight leg raise test and negative left straight leg raise test.     Right knee: Crepitus present. No effusion.     Left knee: Crepitus present. No effusion.     Right lower leg: No edema.     Left lower leg: No edema.  Lymphadenopathy:     Cervical: No cervical adenopathy.  Skin:    Findings: Rash present.     Comments: Silvery scales on a red base - knees, lower legs, elbows, abdomen and lower back.  Neurological:     Mental Status: She is alert.     Wt Readings from Last 3 Encounters:  11/15/22 176 lb (79.8 kg)  10/17/22 173 lb 6 oz (78.6 kg)  04/12/22 169 lb 9.6 oz (76.9 kg)    BP 130/70   Pulse 70   Ht 5' (1.524 m)   Wt 176 lb (79.8 kg)   SpO2 96%   BMI 34.37 kg/m   Assessment and Plan:  Problem List Items Addressed This Visit     Tinnitus of both ears    With complaint of decreased hearing Recommend ENT evaluation      Relevant Orders   Ambulatory referral to ENT   Psoriasis    Long standing rash - she is not interested in treatment at this time due to cost She will continue topical moisturizers for now.      Primary osteoarthritis of both knees    Unable to take nsaids Recommend Tylenol XS 2 tabs bid to tid Will complete handicapped parking placard      Coronary artery disease of native artery of native heart with stable angina pectoris (HCC) (Chronic)    S/p CABG; with chronic heart failure Followed  by Dr. Mariah Milling and Dr. Lalla Brothers      CKD (chronic kidney disease) stage 4, GFR 15-29 ml/min (HCC)    Needs close monitoring and complete avoidance of nsaids - this is  discussed at length with patient and daughter. Will see her in 2 months with renal labs      Centrilobular emphysema (HCC) - Primary    Mild stable symptoms She is minimally symptomatic but also sedentary. Continue Stiolto and Albuterol      Aortic atherosclerosis (HCC)    On appropriate statin and aspirin      Other Visit Diagnoses     Elevated TSH       will get full thyroid panel next visit   Need for vaccination for pneumococcus       Relevant Orders   Pneumococcal conjugate vaccine 20-valent (Completed)       Return in about 2 months (around 01/16/2023), or CKD.    Reubin Milan, MD [facility] [place]

## 2022-11-15 NOTE — Patient Instructions (Signed)
Take Tylenol 500 mg   2 tablets twice a day.

## 2022-11-15 NOTE — Assessment & Plan Note (Signed)
Needs close monitoring and complete avoidance of nsaids - this is discussed at length with patient and daughter. Will see her in 2 months with renal labs

## 2022-11-15 NOTE — Assessment & Plan Note (Addendum)
Unable to take nsaids Recommend Tylenol XS 2 tabs bid to tid Will complete handicapped parking placard

## 2022-11-15 NOTE — Assessment & Plan Note (Signed)
On appropriate statin and aspirin

## 2022-11-15 NOTE — Assessment & Plan Note (Signed)
With complaint of decreased hearing Recommend ENT evaluation

## 2022-11-15 NOTE — Assessment & Plan Note (Signed)
Long standing rash - she is not interested in treatment at this time due to cost She will continue topical moisturizers for now.

## 2022-11-15 NOTE — Assessment & Plan Note (Signed)
S/p CABG; with chronic heart failure Followed by Dr. Mariah Milling and Dr. Lalla Brothers

## 2022-11-15 NOTE — Assessment & Plan Note (Signed)
Mild stable symptoms She is minimally symptomatic but also sedentary. Continue Stiolto and Albuterol

## 2022-11-16 ENCOUNTER — Telehealth: Payer: Self-pay

## 2022-11-16 NOTE — Telephone Encounter (Signed)
Called and left VM letting patient know her handicap placard was ready for pick up. Told her to call back if she would like for me to mail this to her home.  - Sole Lengacher

## 2022-11-16 NOTE — Telephone Encounter (Signed)
Pt called in asking for handicapped placard to  be mailed to her hoe and please make sure [address] is on with there address.

## 2022-11-16 NOTE — Telephone Encounter (Signed)
Please review.  KP

## 2022-11-17 ENCOUNTER — Other Ambulatory Visit: Payer: Self-pay | Admitting: Student

## 2022-11-17 DIAGNOSIS — I5022 Chronic systolic (congestive) heart failure: Secondary | ICD-10-CM

## 2022-11-17 NOTE — Telephone Encounter (Signed)
Will mail to patient home.  - Dana [name]

## 2022-11-23 ENCOUNTER — Other Ambulatory Visit: Payer: Self-pay | Admitting: Student

## 2022-12-06 ENCOUNTER — Other Ambulatory Visit: Payer: Self-pay | Admitting: Student

## 2022-12-12 ENCOUNTER — Other Ambulatory Visit: Payer: Self-pay | Admitting: Student

## 2022-12-12 DIAGNOSIS — I5022 Chronic systolic (congestive) heart failure: Secondary | ICD-10-CM

## 2022-12-14 DIAGNOSIS — H6981 Other specified disorders of Eustachian tube, right ear: Secondary | ICD-10-CM | POA: Diagnosis not present

## 2022-12-14 DIAGNOSIS — H6123 Impacted cerumen, bilateral: Secondary | ICD-10-CM | POA: Diagnosis not present

## 2022-12-14 DIAGNOSIS — H903 Sensorineural hearing loss, bilateral: Secondary | ICD-10-CM | POA: Diagnosis not present

## 2022-12-16 ENCOUNTER — Other Ambulatory Visit: Payer: Self-pay | Admitting: Student

## 2022-12-21 ENCOUNTER — Ambulatory Visit (INDEPENDENT_AMBULATORY_CARE_PROVIDER_SITE_OTHER): Payer: Medicare HMO

## 2022-12-21 DIAGNOSIS — Z0000 Encounter for general adult medical examination without abnormal findings: Secondary | ICD-10-CM

## 2022-12-21 NOTE — Progress Notes (Signed)
Subjective:   Dana [name] is a 77 y.o. female who presents for Medicare Annual (Subsequent) preventive examination.  Visit Complete: Virtual  I connected with  Sherrell Puller on 12/21/22 by a audio enabled telemedicine application and verified that I am speaking with the correct person using two identifiers.  Patient Location: Home  Provider Location: Office/Clinic  I discussed the limitations of evaluation and management by telemedicine. The patient expressed understanding and agreed to proceed.  Vital Signs: Unable to obtain new vitals due to this being a telehealth visit.  Review of Systems     Cardiac Risk Factors include: advanced age (>71men, >3 women);obesity (BMI >30kg/m2);sedentary lifestyle     Objective:    Today's Vitals   12/21/22 1432  PainSc: 0-No pain   There is no height or weight on file to calculate BMI.     12/21/2022    2:37 PM 10/05/2021    1:24 PM 08/19/2021    3:54 PM 07/22/2021    1:59 PM 01/28/2021    3:54 PM 01/17/2021    2:29 PM 01/17/2021    8:27 AM  Advanced Directives  Does Patient Have a Medical Advance Directive? No No No No No No No  Would patient like information on creating a medical advance directive? No - Patient declined No - Patient declined No - Patient declined No - Patient declined No - Patient declined No - Patient declined No - Patient declined    Current Medications (verified) Outpatient Encounter Medications as of 12/21/2022  Medication Sig   acetaminophen (TYLENOL) 325 MG tablet Take 2 tablets (650 mg total) by mouth every 4 (four) hours as needed for headache or mild pain.   albuterol (VENTOLIN HFA) 108 (90 Base) MCG/ACT inhaler TAKE 2 PUFFS BY MOUTH EVERY 6 HOURS AS NEEDED FOR WHEEZE OR SHORTNESS OF BREATH   amiodarone (PACERONE) 200 MG tablet TAKE 1 TABLET BY MOUTH EVERY DAY   aspirin 81 MG chewable tablet Chew 1 tablet (81 mg total) by mouth daily.   atorvastatin (LIPITOR) 80 MG tablet TAKE 1 TABLET BY MOUTH  EVERY DAY   carvedilol (COREG) 3.125 MG tablet TAKE 1 TABLET BY MOUTH TWICE A DAY WITH A MEAL   empagliflozin (JARDIANCE) 10 MG TABS tablet Take 1 tablet (10 mg total) by mouth daily.   ENTRESTO 24-26 MG TAKE 1 TABLET BY MOUTH TWICE A DAY   isosorbide mononitrate (IMDUR) 30 MG 24 hr tablet TAKE 1 TABLET BY MOUTH EVERY DAY   Tiotropium Bromide-Olodaterol (STIOLTO RESPIMAT) 2.5-2.5 MCG/ACT AERS INHALE 2 PUFFS BY MOUTH INTO THE LUNGS DAILY   torsemide (DEMADEX) 20 MG tablet Take 1 tablet (20 mg total) by mouth 4 (four) times a week. Take Monday, Wednesday, Friday, Saturday.   No facility-administered encounter medications on file as of 12/21/2022.    Allergies (verified) Bevespi aerosphere [glycopyrrolate-formoterol]   History: Past Medical History:  Diagnosis Date   CHF (congestive heart failure) (HCC)    Complication of anesthesia    "difficulty waking up, it was all day long"   Coronary artery disease    Hyperlipidemia    LBBB (left bundle branch block)    Psoriasis    Past Surgical History:  Procedure Laterality Date   BIV ICD INSERTION CRT-D N/A 10/05/2021   Procedure: BIV ICD INSERTION CRT-D;  Surgeon: Lanier Prude, MD;  Location: [facility] INVASIVE CV LAB;  Service: Cardiovascular;  Laterality: N/A;   CARDIAC CATHETERIZATION  02/17/2012   CORONARY ARTERY BYPASS GRAFT  02/23/2012   Procedure:  CORONARY ARTERY BYPASS GRAFTING (CABG);  Surgeon: Loreli Slot, MD;  Location: [facility] OR;  Service: Open Heart Surgery;  Laterality: N/A;  coronary artery bypass graft on pump times two utlizing left internal mammary artery and right greater saphenous vein harvested endoscopically, transesophageal echocardiogram    NO PAST SURGERIES     RIGHT/LEFT HEART CATH AND CORONARY/GRAFT ANGIOGRAPHY N/A 07/30/2018   Procedure: RIGHT/LEFT HEART CATH AND CORONARY/GRAFT ANGIOGRAPHY;  Surgeon: Iran Ouch, MD;  Location: [facility] INVASIVE CV LAB;  Service: Cardiovascular;  Laterality: N/A;   TEE WITHOUT  CARDIOVERSION  02/22/2012   Procedure: TRANSESOPHAGEAL ECHOCARDIOGRAM (TEE);  Surgeon: Dolores Patty, MD;  Location: [facility] ENDOSCOPY;  Service: Cardiovascular;  Laterality: N/A;   Family History  Problem Relation Age of Onset   Heart disease Father    Social History   Socioeconomic History   Marital status: Divorced    Spouse name: Not on file   Number of children: Not on file   Years of education: Not on file   Highest education level: Not on file  Occupational History   Not on file  Tobacco Use   Smoking status: Some Days    Types: Cigarettes   Smokeless tobacco: Never   Tobacco comments:    Smoked but cut down when started to get sick  Vaping Use   Vaping status: Never Used  Substance and Sexual Activity   Alcohol use: No   Drug use: No   Sexual activity: Not on file  Other Topics Concern   Not on file  Social History Narrative   Not on file   Social Determinants of Health   Financial Resource Strain: Low Risk  (12/21/2022)   Overall Financial Resource Strain (CARDIA)    Difficulty of Paying Living Expenses: Not hard at all  Food Insecurity: No Food Insecurity (12/21/2022)   Hunger Vital Sign    Worried About Running Out of Food in the Last Year: Never true    Ran Out of Food in the Last Year: Never true  Transportation Needs: No Transportation Needs (12/21/2022)   PRAPARE - Administrator, Civil Service (Medical): No    Lack of Transportation (Non-Medical): No  Physical Activity: Inactive (12/21/2022)   Exercise Vital Sign    Days of Exercise per Week: 0 days    Minutes of Exercise per Session: 0 min  Stress: No Stress Concern Present (12/21/2022)   [employer] of Occupational Health - Occupational Stress Questionnaire    Feeling of Stress : Not at all  Social Connections: Socially Isolated (12/21/2022)   Social Connection and Isolation Panel [NHANES]    Frequency of Communication with Friends and Family: More than three times a week     Frequency of Social Gatherings with Friends and Family: Not on file    Attends Religious Services: Never    Database administrator or Organizations: No    Attends Engineer, structural: Never    Marital Status: Divorced    Tobacco Counseling Ready to quit: Not Answered Counseling given: Not Answered Tobacco comments: Smoked but cut down when started to get sick   Clinical Intake:  Pre-visit preparation completed: Yes  Pain : No/denies pain Pain Score: 0-No pain     Nutritional Risks: None Diabetes: No  How often do you need to have someone help you when you read instructions, pamphlets, or other written materials from your doctor or pharmacy?: 1 - Never  Interpreter Needed?: No  Information entered by ::  Kennedy Bucker, LPN   Activities of Daily Living    12/21/2022    2:38 PM  In your present state of health, do you have any difficulty performing the following activities:  Hearing? 1  Vision? 0  Difficulty concentrating or making decisions? 0  Walking or climbing stairs? 1  Comment ARTHRITIS IN LEGS  Dressing or bathing? 0  Doing errands, shopping? 0  Preparing Food and eating ? N  Using the Toilet? N  In the past six months, have you accidently leaked urine? N  Do you have problems with loss of bowel control? N  Managing your Medications? N  Managing your Finances? N  Housekeeping or managing your Housekeeping? N    Patient Care Team: Reubin Milan, MD as PCP - General (Internal Medicine) Lanier Prude, MD as PCP - Electrophysiology (Cardiology) Antonieta Iba, MD as Consulting Physician (Cardiology)  Indicate any recent Medical Services you may have received from other than [facility] providers in the past year (date may be approximate).     Assessment:   This is a routine wellness examination for [place].  Hearing/Vision screen Hearing Screening - Comments:: NO AIDS Vision Screening - Comments:: [facility]  Dietary issues  and exercise activities discussed:     Goals Addressed             This Visit's Progress    DIET - EAT MORE FRUITS AND VEGETABLES         Depression Screen    12/21/2022    2:35 PM 11/15/2022    3:37 PM 08/19/2021    3:54 PM 07/22/2021    2:00 PM 01/28/2021    3:53 PM  PHQ 2/9 Scores  PHQ - 2 Score 0 0 0 0 0  PHQ- 9 Score 0 2 0  0    Fall Risk    12/21/2022    2:37 PM 11/15/2022    3:37 PM 08/19/2021    3:54 PM 07/22/2021    1:59 PM 01/28/2021    3:53 PM  Fall Risk   Falls in the past year? 0 0 1 1 0  Number falls in past yr: 0 0 0 0 0  Injury with Fall? 0 0 1 1 0  Comment    BRUISED EYE   Risk for fall due to : No Fall Risks History of fall(s) No Fall Risks  No Fall Risks  Follow up Falls prevention discussed;Falls evaluation completed Falls evaluation completed Falls evaluation completed;Falls prevention discussed Falls evaluation completed Falls prevention discussed;Falls evaluation completed    MEDICARE RISK AT HOME: Medicare Risk at Home Any stairs in or around the home?: Yes If so, are there any without handrails?: No Home free of loose throw rugs in walkways, pet beds, electrical cords, etc?: Yes Adequate lighting in your home to reduce risk of falls?: Yes Life alert?: No Use of a cane, walker or w/c?: Yes (CANE) Grab bars in the bathroom?: Yes Shower chair or bench in shower?: No  TIMED UP AND GO:  Was the test performed?  No    Cognitive Function:        12/21/2022    2:39 PM  6CIT Screen  What Year? 0 points  What month? 0 points  What time? 0 points  Count back from 20 0 points  Months in reverse 0 points  Repeat phrase 0 points  Total Score 0 points    Immunizations Immunization History  Administered Date(s) Administered   Fluad Quad(high Dose  65+) 01/28/2021   Moderna Covid-19 Vaccine Bivalent Booster 48yrs & up 03/10/2022   PNEUMOCOCCAL CONJUGATE-20 11/15/2022    TDAP status: Due, Education has been provided regarding the importance  of this vaccine. Advised may receive this vaccine at local pharmacy or Health Dept. Aware to provide a copy of the vaccination record if obtained from local pharmacy or Health Dept. Verbalized acceptance and understanding.  Flu Vaccine status: Declined, Education has been provided regarding the importance of this vaccine but patient still declined. Advised may receive this vaccine at local pharmacy or Health Dept. Aware to provide a copy of the vaccination record if obtained from local pharmacy or Health Dept. Verbalized acceptance and understanding.  Pneumococcal vaccine status: Up to date  Covid-19 vaccine status: Completed vaccines  Qualifies for Shingles Vaccine? Yes   Zostavax completed No   Shingrix Completed?: No.    Education has been provided regarding the importance of this vaccine. Patient has been advised to call insurance company to determine out of pocket expense if they have not yet received this vaccine. Advised may also receive vaccine at local pharmacy or Health Dept. Verbalized acceptance and understanding.  Screening Tests Health Maintenance  Topic Date Due   DTaP/Tdap/Td (1 - Tdap) Never done   Zoster Vaccines- Shingrix (1 of 2) Never done   DEXA SCAN  Never done   COVID-19 Vaccine (2 - Moderna risk series) 04/07/2022   INFLUENZA VACCINE  12/01/2022   Medicare Annual Wellness (AWV)  12/21/2023   Pneumonia Vaccine 18+ Years old  Completed   Hepatitis C Screening  Completed   HPV VACCINES  Aged Out    Health Maintenance  Health Maintenance Due  Topic Date Due   DTaP/Tdap/Td (1 - Tdap) Never done   Zoster Vaccines- Shingrix (1 of 2) Never done   DEXA SCAN  Never done   COVID-19 Vaccine (2 - Moderna risk series) 04/07/2022   INFLUENZA VACCINE  12/01/2022    Colorectal cancer screening: No longer required.   Mammogram status: No longer required due to AGE.  DECLINED REFERRAL FOR BDS  Lung Cancer Screening: (Low Dose CT Chest recommended if Age 33-80 years,  20 pack-year currently smoking OR have quit w/in 15years.) does not qualify.   Additional Screening:  Hepatitis C Screening: does qualify; Completed 08/19/21  Vision Screening: Recommended annual ophthalmology exams for early detection of glaucoma and other disorders of the eye. Is the patient up to date with their annual eye exam?  Yes  Who is the provider or what is the name of the office in which the patient attends annual eye exams? [facility] If pt is not established with a provider, would they like to be referred to a provider to establish care? No .   Dental Screening: Recommended annual dental exams for proper oral hygiene   Community Resource Referral / Chronic Care Management: CRR required this visit?  No   CCM required this visit?  No     Plan:     I have personally reviewed and noted the following in the patient's chart:   Medical and social history Use of alcohol, tobacco or illicit drugs  Current medications and supplements including opioid prescriptions. Patient is not currently taking opioid prescriptions. Functional ability and status Nutritional status Physical activity Advanced directives List of other physicians Hospitalizations, surgeries, and ER visits in previous 12 months Vitals Screenings to include cognitive, depression, and falls Referrals and appointments  In addition, I have reviewed and discussed with patient certain preventive  protocols, quality metrics, and best practice recommendations. A written personalized care plan for preventive services as well as general preventive health recommendations were provided to patient.     Hal Hope, LPN   4/78/2956   After Visit Summary: (MyChart) Due to this being a telephonic visit, the after visit summary with patients personalized plan was offered to patient via MyChart   Nurse Notes: NONE

## 2022-12-21 NOTE — Patient Instructions (Signed)
Ms. Dana [name] , Thank you for taking time to come for your Medicare Wellness Visit. I appreciate your ongoing commitment to your health goals. Please review the following plan we discussed and let me know if I can assist you in the future.   Referrals/Orders/Follow-Ups/Clinician Recommendations: NONE  This is a list of the screening recommended for you and due dates:  Health Maintenance  Topic Date Due   DTaP/Tdap/Td vaccine (1 - Tdap) Never done   Zoster (Shingles) Vaccine (1 of 2) Never done   DEXA scan (bone density measurement)  Never done   COVID-19 Vaccine (2 - Moderna risk series) 04/07/2022   Flu Shot  12/01/2022   Medicare Annual Wellness Visit  12/21/2023   Pneumonia Vaccine  Completed   Hepatitis C Screening  Completed   HPV Vaccine  Aged Out    Advanced directives: (ACP Link)Information on Advanced Care Planning can be found at [facility] Advance Health Care Directives Advance Health Care Directives ([email or web address]   Next Medicare Annual Wellness Visit scheduled for next year: Yes   12/27/23 @ 1:30 PM BY PHONE

## 2022-12-25 ENCOUNTER — Other Ambulatory Visit: Payer: Self-pay | Admitting: Cardiology

## 2022-12-27 NOTE — Telephone Encounter (Signed)
Please contact pt for future appointment. Pt due for follow up 01/2023.

## 2022-12-29 ENCOUNTER — Other Ambulatory Visit: Payer: Self-pay | Admitting: Student

## 2022-12-29 DIAGNOSIS — I5022 Chronic systolic (congestive) heart failure: Secondary | ICD-10-CM

## 2023-01-09 ENCOUNTER — Other Ambulatory Visit: Payer: Self-pay | Admitting: Student

## 2023-01-16 ENCOUNTER — Ambulatory Visit: Payer: Medicare HMO | Admitting: Internal Medicine

## 2023-01-17 DIAGNOSIS — H6981 Other specified disorders of Eustachian tube, right ear: Secondary | ICD-10-CM | POA: Diagnosis not present

## 2023-01-17 DIAGNOSIS — H903 Sensorineural hearing loss, bilateral: Secondary | ICD-10-CM | POA: Diagnosis not present

## 2023-01-18 ENCOUNTER — Ambulatory Visit (INDEPENDENT_AMBULATORY_CARE_PROVIDER_SITE_OTHER): Payer: Medicare HMO

## 2023-01-18 DIAGNOSIS — I255 Ischemic cardiomyopathy: Secondary | ICD-10-CM | POA: Diagnosis not present

## 2023-01-18 LAB — CUP PACEART REMOTE DEVICE CHECK
Battery Remaining Longevity: 34 mo
Battery Remaining Percentage: 72 %
Battery Voltage: 2.96 V
Brady Statistic AP VP Percent: 91 %
Brady Statistic AP VS Percent: 1 %
Brady Statistic AS VP Percent: 8.8 %
Brady Statistic AS VS Percent: 1 %
Brady Statistic RA Percent Paced: 91 %
Date Time Interrogation Session: 20240918020016
HighPow Impedance: 74 Ohm
HighPow Impedance: 74 Ohm
Implantable Lead Connection Status: 753985
Implantable Lead Connection Status: 753985
Implantable Lead Connection Status: 753985
Implantable Lead Implant Date: 20131024
Implantable Lead Implant Date: 20230606
Implantable Lead Implant Date: 20230606
Implantable Lead Location: 753859
Implantable Lead Location: 753860
Implantable Lead Location: 753862
Implantable Lead Model: 5071
Implantable Pulse Generator Implant Date: 20230606
Lead Channel Impedance Value: 280 Ohm
Lead Channel Impedance Value: 400 Ohm
Lead Channel Impedance Value: 600 Ohm
Lead Channel Pacing Threshold Amplitude: 0.625 V
Lead Channel Pacing Threshold Amplitude: 0.625 V
Lead Channel Pacing Threshold Amplitude: 2 V
Lead Channel Pacing Threshold Pulse Width: 0.5 ms
Lead Channel Pacing Threshold Pulse Width: 0.5 ms
Lead Channel Pacing Threshold Pulse Width: 1 ms
Lead Channel Sensing Intrinsic Amplitude: 12 mV
Lead Channel Sensing Intrinsic Amplitude: 3.5 mV
Lead Channel Setting Pacing Amplitude: 1.625
Lead Channel Setting Pacing Amplitude: 2 V
Lead Channel Setting Pacing Amplitude: 3 V
Lead Channel Setting Pacing Pulse Width: 0.5 ms
Lead Channel Setting Pacing Pulse Width: 1 ms
Lead Channel Setting Sensing Sensitivity: 0.5 mV
Pulse Gen Serial Number: 5538587
Zone Setting Status: 755011

## 2023-01-24 NOTE — Progress Notes (Signed)
Remote ICD transmission.   

## 2023-01-28 ENCOUNTER — Other Ambulatory Visit: Payer: Self-pay | Admitting: Student

## 2023-01-28 DIAGNOSIS — I5022 Chronic systolic (congestive) heart failure: Secondary | ICD-10-CM

## 2023-01-31 ENCOUNTER — Encounter: Payer: Self-pay | Admitting: Internal Medicine

## 2023-01-31 ENCOUNTER — Ambulatory Visit (INDEPENDENT_AMBULATORY_CARE_PROVIDER_SITE_OTHER): Payer: Medicare HMO | Admitting: Internal Medicine

## 2023-01-31 VITALS — BP 118/62 | HR 78 | Temp 97.9°F | Resp 18 | Ht 60.0 in | Wt 173.6 lb

## 2023-01-31 DIAGNOSIS — I5022 Chronic systolic (congestive) heart failure: Secondary | ICD-10-CM

## 2023-01-31 DIAGNOSIS — Z23 Encounter for immunization: Secondary | ICD-10-CM

## 2023-01-31 DIAGNOSIS — H903 Sensorineural hearing loss, bilateral: Secondary | ICD-10-CM

## 2023-01-31 DIAGNOSIS — J432 Centrilobular emphysema: Secondary | ICD-10-CM

## 2023-01-31 DIAGNOSIS — Z13820 Encounter for screening for osteoporosis: Secondary | ICD-10-CM

## 2023-01-31 DIAGNOSIS — N184 Chronic kidney disease, stage 4 (severe): Secondary | ICD-10-CM

## 2023-01-31 MED ORDER — ENTRESTO 24-26 MG PO TABS
1.0000 | ORAL_TABLET | Freq: Two times a day (BID) | ORAL | 1 refills | Status: DC
Start: 2023-01-31 — End: 2023-07-31

## 2023-01-31 MED ORDER — STIOLTO RESPIMAT 2.5-2.5 MCG/ACT IN AERS
2.0000 | INHALATION_SPRAY | Freq: Every day | RESPIRATORY_TRACT | 12 refills | Status: DC
Start: 2023-01-31 — End: 2023-12-12

## 2023-01-31 NOTE — Progress Notes (Signed)
Date:  01/31/2023   Name:  Dana [name]   DOB:  [birth year]   MRN:  [id number]   Chief Complaint: Follow-up  HPI CKD - stage 4 without fluid overload.  Diuretics managed by cardiology.  Also on [place]. Tinnitus - seen by ENT, audiogram showed bilateral sensorineural hearing loss. COPD - stable mild symptoms; continues on Stiolto and albuterol   Due for Flu vaccine and should probably have RSV. Rash - chronic extensive psoriasis - treated only with Vaseline per pt preference. CHF - on Entresto but out for the past 2 days trying to get a refill.  She is scheduled to see Cardiology in 2 months.  Lab Results  Component Value Date   NA 141 10/18/2022   K 4.8 10/18/2022   CO2 26 10/18/2022   GLUCOSE 88 10/18/2022   BUN 38 (H) 10/18/2022   CREATININE 1.88 (H) 10/18/2022   CALCIUM 9.5 10/18/2022   EGFR 27 (L) 10/18/2022   GFRNONAA 34 (L) 09/24/2021   Lab Results  Component Value Date   CHOL 148 10/18/2022   HDL 61 10/18/2022   LDLCALC 64 10/18/2022   LDLDIRECT 124 (H) 02/05/2021   TRIG 130 10/18/2022   CHOLHDL 2.4 10/18/2022   Lab Results  Component Value Date   TSH 13.600 (H) 02/05/2021   Lab Results  Component Value Date   HGBA1C 5.5 07/26/2018   Lab Results  Component Value Date   WBC 6.3 10/18/2022   HGB 12.9 10/18/2022   HCT 39.5 10/18/2022   MCV 99 (H) 10/18/2022   PLT 259 10/18/2022   Lab Results  Component Value Date   ALT 12 10/18/2022   AST 19 10/18/2022   ALKPHOS 76 10/18/2022   BILITOT 0.3 10/18/2022   No results found for: "25OHVITD2", "25OHVITD3", "VD25OH"   Review of Systems  Constitutional:  Negative for chills, fatigue and unexpected weight change.  HENT:  Negative for nosebleeds.   Eyes:  Negative for visual disturbance.  Respiratory:  Positive for shortness of breath. Negative for cough, chest tightness and wheezing.   Cardiovascular:  Negative for chest pain, palpitations and leg swelling.  Gastrointestinal:  Negative  for abdominal pain, constipation and diarrhea.  Musculoskeletal:  Positive for arthralgias and gait problem. Negative for myalgias.  Neurological:  Negative for dizziness, weakness, light-headedness and headaches.  Psychiatric/Behavioral:  Negative for dysphoric mood and sleep disturbance. The patient is not nervous/anxious.     Patient Active Problem List   Diagnosis Date Noted   Sensorineural hearing loss (SNHL) of both ears 01/31/2023   Aortic atherosclerosis (HCC) 11/15/2022   CKD (chronic kidney disease) stage 4, GFR 15-29 ml/min (HCC) 11/15/2022   Primary osteoarthritis of both knees 11/15/2022   Tinnitus of both ears 11/15/2022   Presence of biventricular AICD 11/15/2022   Ischemic cardiomyopathy 10/05/2021   Atrial tachycardia (HCC) 07/24/2021   Psoriasis 01/28/2021   NSTEMI (non-ST elevated myocardial infarction) [facility])    Coronary artery disease of native artery of native heart with stable angina pectoris (HCC) 09/09/2018   Centrilobular emphysema (HCC) 09/09/2018   Combined systolic and diastolic congestive heart failure (HCC) 07/25/2018   Mitral regurgitation 02/22/2012   Tobacco abuse 02/18/2012   Left bundle branch block 02/18/2012   Iron deficiency 02/18/2012    Allergies  Allergen Reactions   Bevespi Aerosphere [Glycopyrrolate-Formoterol] Hives and Rash    Past Surgical History:  Procedure Laterality Date   BIV ICD INSERTION CRT-D N/A 10/05/2021   Procedure: BIV ICD INSERTION CRT-D;  Surgeon: Lanier Prude, MD;  Location: [facility] INVASIVE CV LAB;  Service: Cardiovascular;  Laterality: N/A;   CARDIAC CATHETERIZATION  02/17/2012   CORONARY ARTERY BYPASS GRAFT  02/23/2012   Procedure: CORONARY ARTERY BYPASS GRAFTING (CABG);  Surgeon: Loreli Slot, MD;  Location: [facility] OR;  Service: Open Heart Surgery;  Laterality: N/A;  coronary artery bypass graft on pump times two utlizing left internal mammary artery and right greater saphenous vein harvested endoscopically,  transesophageal echocardiogram    NO PAST SURGERIES     RIGHT/LEFT HEART CATH AND CORONARY/GRAFT ANGIOGRAPHY N/A 07/30/2018   Procedure: RIGHT/LEFT HEART CATH AND CORONARY/GRAFT ANGIOGRAPHY;  Surgeon: Iran Ouch, MD;  Location: [facility] INVASIVE CV LAB;  Service: Cardiovascular;  Laterality: N/A;   TEE WITHOUT CARDIOVERSION  02/22/2012   Procedure: TRANSESOPHAGEAL ECHOCARDIOGRAM (TEE);  Surgeon: Dolores Patty, MD;  Location[facility] ENDOSCOPY;  Service: Cardiovascular;  Laterality: N/A;    Social History   Tobacco Use   Smoking status: Some Days    Types: Cigarettes   Smokeless tobacco: Never  Vaping Use   Vaping status: Never Used  Substance Use Topics   Alcohol use: No   Drug use: No     Medication list has been reviewed and updated.  Current Meds  Medication Sig   acetaminophen (TYLENOL) 325 MG tablet Take 2 tablets (650 mg total) by mouth every 4 (four) hours as needed for headache or mild pain.   albuterol (VENTOLIN HFA) 108 (90 Base) MCG/ACT inhaler TAKE 2 PUFFS BY MOUTH EVERY 6 HOURS AS NEEDED FOR WHEEZE OR SHORTNESS OF BREATH   amiodarone (PACERONE) 200 MG tablet TAKE 1 TABLET BY MOUTH EVERY DAY   aspirin 81 MG chewable tablet Chew 1 tablet (81 mg total) by mouth daily.   atorvastatin (LIPITOR) 80 MG tablet TAKE 1 TABLET BY MOUTH EVERY DAY   carvedilol (COREG) 3.125 MG tablet TAKE 1 TABLET BY MOUTH TWICE A DAY WITH FOOD   empagliflozin (JARDIANCE) 10 MG TABS tablet Take 1 tablet (10 mg total) by mouth daily.   isosorbide mononitrate (IMDUR) 30 MG 24 hr tablet TAKE 1 TABLET BY MOUTH EVERY DAY   [DISCONTINUED] ENTRESTO 24-26 MG TAKE 1 TABLET BY MOUTH TWICE A DAY   [DISCONTINUED] Tiotropium Bromide-Olodaterol (STIOLTO RESPIMAT) 2.5-2.5 MCG/ACT AERS INHALE 2 PUFFS BY MOUTH INTO THE LUNGS DAILY       01/31/2023    1:13 PM 11/15/2022    3:37 PM  GAD 7 : Generalized Anxiety Score  Nervous, Anxious, on Edge 0 0  Control/stop worrying 0 0  Worry too much - different things  0 0  Trouble relaxing 0 0  Restless 0 0  Easily annoyed or irritable 0 0  Afraid - awful might happen 0 0  Total GAD 7 Score 0 0  Anxiety Difficulty  Not difficult at all       01/31/2023    1:13 PM 12/21/2022    2:35 PM 11/15/2022    3:37 PM  Depression screen PHQ 2/9  Decreased Interest 0 0 0  Down, Depressed, Hopeless 0 0 0  PHQ - 2 Score 0 0 0  Altered sleeping 0 0 0  Tired, decreased energy 1 0 1  Change in appetite 1 0 1  Feeling bad or failure about yourself  0 0 0  Trouble concentrating 0 0 0  Moving slowly or fidgety/restless 0 0 0  Suicidal thoughts 0 0 0  PHQ-9 Score 2 0 2  Difficult doing work/chores Somewhat difficult Not  difficult at all Not difficult at all    BP Readings from Last 3 Encounters:  01/31/23 118/62  11/15/22 130/70  10/17/22 106/60    Physical Exam Vitals and nursing note reviewed.  Constitutional:      General: She is not in acute distress.    Appearance: She is well-developed.  HENT:     Head: Normocephalic and atraumatic.     Right Ear: Decreased hearing noted.     Left Ear: Decreased hearing noted.  Cardiovascular:     Rate and Rhythm: Normal rate and regular rhythm.  Pulmonary:     Effort: Pulmonary effort is normal. No respiratory distress.     Breath sounds: Decreased air movement present. Decreased breath sounds present. No wheezing or rhonchi.  Skin:    General: Skin is warm and dry.     Findings: Rash present.     Comments: Severe psoriatic rash with plaque on both lower legs and elbows and arms.  Neurological:     Mental Status: She is alert and oriented to person, place, and time.  Psychiatric:        Mood and Affect: Mood normal.        Behavior: Behavior normal.     Wt Readings from Last 3 Encounters:  01/31/23 173 lb 9.6 oz (78.7 kg)  11/15/22 176 lb (79.8 kg)  10/17/22 173 lb 6 oz (78.6 kg)    BP 118/62 (BP Location: Right Arm, Patient Position: Sitting, Cuff Size: Normal)   Pulse 78   Temp 97.9 F (36.6  C) (Oral)   Resp 18   Ht 5' (1.524 m) Comment: per patient  Wt 173 lb 9.6 oz (78.7 kg)   SpO2 94%   BMI 33.90 kg/m   Assessment and Plan:  Problem List Items Addressed This Visit       Unprioritized   Centrilobular emphysema (HCC)   Relevant Medications   Tiotropium Bromide-Olodaterol (STIOLTO RESPIMAT) 2.5-2.5 MCG/ACT AERS   CKD (chronic kidney disease) stage 4, GFR 15-29 ml/min (HCC) - Primary    Here for follow up labs today. Last GFR 27      Relevant Orders   Basic metabolic panel   Parathyroid hormone, intact (no Ca)   Phosphorus   VITAMIN D 25 Hydroxy (Vit-D Deficiency, Fractures)   Sensorineural hearing loss (SNHL) of both ears   Other Visit Diagnoses     Osteoporosis screening       Relevant Orders   DG Bone Density   Need for immunization against influenza       Relevant Orders   Flu Vaccine Trivalent High Dose (Fluad) (Completed)   Need for COVID-19 vaccine       Relevant Orders   Pfizer Comirnaty Covid -19 Vaccine 20yrs and older (Completed)   Chronic systolic (congestive) heart failure (HCC)       Relevant Medications   sacubitril-valsartan (ENTRESTO) 24-26 MG       Return in about 6 months (around 08/01/2023) for CPX.    Reubin Milan, MD [facility] [place]

## 2023-01-31 NOTE — Patient Instructions (Signed)
Call [facility] to schedule your bone density at [phone].

## 2023-01-31 NOTE — Assessment & Plan Note (Signed)
Here for follow up labs today. Last GFR 27

## 2023-02-01 ENCOUNTER — Encounter: Payer: Self-pay | Admitting: Internal Medicine

## 2023-02-01 DIAGNOSIS — N2581 Secondary hyperparathyroidism of renal origin: Secondary | ICD-10-CM | POA: Insufficient documentation

## 2023-02-01 LAB — BASIC METABOLIC PANEL
BUN/Creatinine Ratio: 17 (ref 12–28)
BUN: 28 mg/dL — ABNORMAL HIGH (ref 8–27)
CO2: 24 mmol/L (ref 20–29)
Calcium: 9.2 mg/dL (ref 8.7–10.3)
Chloride: 101 mmol/L (ref 96–106)
Creatinine, Ser: 1.62 mg/dL — ABNORMAL HIGH (ref 0.57–1.00)
Glucose: 89 mg/dL (ref 70–99)
Potassium: 4.7 mmol/L (ref 3.5–5.2)
Sodium: 142 mmol/L (ref 134–144)
eGFR: 32 mL/min/{1.73_m2} — ABNORMAL LOW (ref >59)

## 2023-02-01 LAB — PARATHYROID HORMONE, INTACT (NO CA): PTH: 99 pg/mL — ABNORMAL HIGH (ref 15–65)

## 2023-02-01 LAB — VITAMIN D 25 HYDROXY (VIT D DEFICIENCY, FRACTURES): Vit D, 25-Hydroxy: 14.3 ng/mL — ABNORMAL LOW (ref 30.0–100.0)

## 2023-02-01 LAB — PHOSPHORUS: Phosphorus: 3.4 mg/dL (ref 3.0–4.3)

## 2023-02-01 NOTE — Progress Notes (Signed)
Remote ICD transmission.   

## 2023-02-06 NOTE — Progress Notes (Signed)
3rd attempt printed and mailed.  KP

## 2023-02-14 ENCOUNTER — Ambulatory Visit
Admission: RE | Admit: 2023-02-14 | Discharge: 2023-02-14 | Disposition: A | Discharge status: Home / Self Care | Payer: Medicare HMO | Source: Ambulatory Visit | Attending: Internal Medicine | Admitting: Internal Medicine

## 2023-02-14 DIAGNOSIS — M810 Age-related osteoporosis without current pathological fracture: Secondary | ICD-10-CM | POA: Diagnosis not present

## 2023-02-14 DIAGNOSIS — Z780 Asymptomatic menopausal state: Secondary | ICD-10-CM | POA: Diagnosis not present

## 2023-02-14 DIAGNOSIS — Z13820 Encounter for screening for osteoporosis: Secondary | ICD-10-CM | POA: Insufficient documentation

## 2023-02-16 NOTE — Progress Notes (Signed)
Called pt unable to reach.  KP

## 2023-02-17 NOTE — Progress Notes (Signed)
Labs printed and mailed.  KP 

## 2023-02-24 DIAGNOSIS — H903 Sensorineural hearing loss, bilateral: Secondary | ICD-10-CM | POA: Diagnosis not present

## 2023-03-09 DIAGNOSIS — H903 Sensorineural hearing loss, bilateral: Secondary | ICD-10-CM | POA: Diagnosis not present

## 2023-03-21 ENCOUNTER — Other Ambulatory Visit: Payer: Self-pay | Admitting: Internal Medicine

## 2023-03-23 NOTE — Telephone Encounter (Signed)
Requested Prescriptions  Pending Prescriptions Disp Refills   isosorbide mononitrate (IMDUR) 30 MG 24 hr tablet [Pharmacy Med Name: ISOSORBIDE MONONIT ER 30 MG TB] 90 tablet 0    Sig: TAKE 1 TABLET BY MOUTH EVERY DAY     Cardiovascular:  Nitrates Passed - 03/23/2023  8:28 AM      Passed - Last BP in normal range    BP Readings from Last 1 Encounters:  01/31/23 118/62         Passed - Last Heart Rate in normal range    Pulse Readings from Last 1 Encounters:  01/31/23 78         Passed - Valid encounter within last 12 months    Recent Outpatient Visits           1 month ago CKD (chronic kidney disease) stage 4, GFR 15-29 ml/min (HCC)   [facility], Nyoka Cowden, MD   4 months ago Centrilobular emphysema [facility])   [facility] at [facility], Nyoka Cowden, MD       Future Appointments             In 3 weeks Gollan, Tollie Pizza, MD [facility] at [facility]

## 2023-03-24 ENCOUNTER — Ambulatory Visit: Payer: Medicare HMO | Attending: Cardiology | Admitting: Cardiology

## 2023-03-24 ENCOUNTER — Encounter: Payer: Self-pay | Admitting: Cardiology

## 2023-03-24 VITALS — BP 112/62 | HR 70 | Ht 60.0 in | Wt 179.5 lb

## 2023-03-24 DIAGNOSIS — I5042 Chronic combined systolic (congestive) and diastolic (congestive) heart failure: Secondary | ICD-10-CM

## 2023-03-24 DIAGNOSIS — I447 Left bundle-branch block, unspecified: Secondary | ICD-10-CM

## 2023-03-24 DIAGNOSIS — Z95810 Presence of automatic (implantable) cardiac defibrillator: Secondary | ICD-10-CM

## 2023-03-24 DIAGNOSIS — I255 Ischemic cardiomyopathy: Secondary | ICD-10-CM

## 2023-03-24 DIAGNOSIS — Z79899 Other long term (current) drug therapy: Secondary | ICD-10-CM

## 2023-03-24 DIAGNOSIS — I4719 Other supraventricular tachycardia: Secondary | ICD-10-CM

## 2023-03-24 LAB — CUP PACEART INCLINIC DEVICE CHECK
Battery Remaining Longevity: 31 mo
Brady Statistic RA Percent Paced: 90 %
Brady Statistic RV Percent Paced: 99.88 %
Date Time Interrogation Session: 20241122162109
HighPow Impedance: 67.5 Ohm
Implantable Lead Connection Status: 753985
Implantable Lead Connection Status: 753985
Implantable Lead Connection Status: 753985
Implantable Lead Implant Date: 20131024
Implantable Lead Implant Date: 20230606
Implantable Lead Implant Date: 20230606
Implantable Lead Location: 753859
Implantable Lead Location: 753860
Implantable Lead Location: 753862
Implantable Lead Model: 5071
Implantable Pulse Generator Implant Date: 20230606
Lead Channel Impedance Value: 287.5 Ohm
Lead Channel Impedance Value: 400 Ohm
Lead Channel Impedance Value: 637.5 Ohm
Lead Channel Pacing Threshold Amplitude: 0.75 V
Lead Channel Pacing Threshold Amplitude: 0.75 V
Lead Channel Pacing Threshold Amplitude: 0.75 V
Lead Channel Pacing Threshold Amplitude: 0.75 V
Lead Channel Pacing Threshold Amplitude: 2.5 V
Lead Channel Pacing Threshold Amplitude: 2.5 V
Lead Channel Pacing Threshold Pulse Width: 0.5 ms
Lead Channel Pacing Threshold Pulse Width: 0.5 ms
Lead Channel Pacing Threshold Pulse Width: 0.5 ms
Lead Channel Pacing Threshold Pulse Width: 0.5 ms
Lead Channel Pacing Threshold Pulse Width: 1 ms
Lead Channel Pacing Threshold Pulse Width: 1 ms
Lead Channel Sensing Intrinsic Amplitude: 12 mV
Lead Channel Sensing Intrinsic Amplitude: 3.4 mV
Lead Channel Setting Pacing Amplitude: 2 V
Lead Channel Setting Pacing Amplitude: 3 V
Lead Channel Setting Pacing Amplitude: 3.125
Lead Channel Setting Pacing Pulse Width: 0.5 ms
Lead Channel Setting Pacing Pulse Width: 1 ms
Lead Channel Setting Sensing Sensitivity: 0.5 mV
Pulse Gen Serial Number: 5538587
Zone Setting Status: 755011

## 2023-03-24 MED ORDER — METOPROLOL SUCCINATE ER 25 MG PO TB24
12.5000 mg | ORAL_TABLET | Freq: Every day | ORAL | 11 refills | Status: DC
Start: 2023-03-24 — End: 2023-04-05

## 2023-03-24 NOTE — Patient Instructions (Signed)
Medication Instructions:  Stop Carvedilol 3.125 MG twice daily.  Start Metoprolol Succinate 0.5 tablets (12.5 MG) nightly.   *If you need a refill on your cardiac medications before your next appointment, please call your pharmacy*   Lab Work: Your provider would like for you to have following labs drawn today CMP, TSH and T4.   If you have labs (blood work) drawn today and your tests are completely normal, you will receive your results only by: MyChart Message (if you have MyChart) OR A paper copy in the mail If you have any lab test that is abnormal or we need to change your treatment, we will call you to review the results.   Testing/Procedures: None ordered.   Follow-Up: At [facility], you and your health needs are our priority.  As part of our continuing mission to provide you with exceptional heart care, we have created designated Provider Care Teams.  These Care Teams include your primary Cardiologist (physician) and Advanced Practice Providers (APPs -  Physician Assistants and Nurse Practitioners) who all work together to provide you with the care you need, when you need it.  We recommend signing up for the patient portal called "MyChart".  Sign up information is provided on this After Visit Summary.  MyChart is used to connect with patients for Virtual Visits (Telemedicine).  Patients are able to view lab/test results, encounter notes, upcoming appointments, etc.  Non-urgent messages can be sent to your provider as well.   To learn more about what you can do with MyChart, go to [email or web address].    Your next appointment:   6 month(s)  Provider:   Steffanie Dunn, MD or Sherie Don, NP

## 2023-03-24 NOTE — Progress Notes (Signed)
Cardiology Clinic Note    Date:  03/24/2023  Patient ID:  Kade, Deatrick Jan 27, 1946, MRN [id number] PCP:  Reubin Milan, MD  Cardiologist:  None Electrophysiologist: Lanier Prude, MD    Patient Profile    Chief Complaint: 1 year device follow-up  History of Present Illness: Dana [name] is a 77 y.o. female with PMH notable for HFrEF, LBBB, s/p CRT-D, CAD s/p CABG, CKD, tobacco use; seen today for Lanier Prude, MD for routine electrophysiology followup.  She last saw Dr. Lalla Brothers 12/2021 for routine 91d post-device implant appt where she c/o fatigue. Dr. Lalla Brothers increased her lower rate limit to 70  On follow-up today, patient continues to have fatigue, did not notice any change in her energy with Dr. Lovena Neighbours device adjustments.  She does remember a day when she forgot to take her AM medication and she had more energy. She questions whether there are any medications that can be stopped or dose lowered.  Her activity is severely limited by her knee arthritis, states she has tried creams and patches and nothing else. She takes tylenol arthritis BID without improvement.  She continues to take torsemide 3-4 days a week.  She denies chest pain, chest pressure, palpitations. SOB or dyspnea. Has good appetite.   Her daughter joins for today's appt.     Device Information: [employer] CRT-D, imp 09/2021; dx ICM LV lead is tunneled epicardial lead  AAD History: Amiodarone for atrial tach    ROS:  Please see the history of present illness. All other systems are reviewed and otherwise negative.    Physical Exam    VS:  BP 112/62 (BP Location: Left Arm, Patient Position: Sitting, Cuff Size: Normal)   Pulse 70   Ht 5' (1.524 m)   Wt 179 lb 8 oz (81.4 kg)   SpO2 96%   BMI 35.06 kg/m  BMI: Body mass index is 35.06 kg/m.  Wt Readings from Last 3 Encounters:  03/24/23 179 lb 8 oz (81.4 kg)  01/31/23 173 lb 9.6 oz (78.7 kg)  11/15/22 176 lb (79.8 kg)      GEN- The patient is well appearing, alert and oriented x 3 today.   Lungs- Clear to ausculation bilaterally, normal work of breathing.  Heart- Regular rate and rhythm, no murmurs, rubs or gallops Extremities- Trace peripheral edema, warm, dry Skin-  device pocket well-healed, no tethering   Device interrogation done today and reviewed by myself:  Battery 2.6-3 years Lead thresholds, impedence, sensing stable  LV threshold remains elevated, programmed at 0.5 safety margin - 3mV at 1s One brief episodes AMS episode Lowered rate limit to 60bpm Histograms somewhat blunted   Studies Reviewed   Previous EP, cardiology notes.    EKG is ordered. Personal review of EKG from today shows:    EKG Interpretation Date/Time:  Friday March 24 2023 14:41:46 EST Ventricular Rate:  70 PR Interval:    QRS Duration:  160 QT Interval:  530 QTC Calculation: 572 R Axis:   189  Text Interpretation: AV dual-paced rhythm Confirmed by Sherie Don [phone]) on 03/24/2023 2:44:50 PM    TTE, 02/23/2022  1. Left ventricular ejection fraction, by estimation, is 25 to 30%. The left ventricle has severely decreased function. The left ventricle demonstrates global hypokinesis. The left ventricular internal cavity size was moderately dilated. Left ventricular diastolic parameters are indeterminate.   2. Right ventricular systolic function is mildly reduced. The right ventricular size is normal. There is normal pulmonary  artery systolic pressure.   3. The mitral valve is normal in structure. Mild to moderate mitral valve regurgitation.   4. The aortic valve was not well visualized. Aortic valve regurgitation is not visualized.   5. The inferior vena cava is normal in size with greater than 50% respiratory variability, suggesting right atrial pressure of 3 mmHg.   Comparison(s): Previous LVEF reported as 10-15%.   TTE, 01/17/2021 1. Left ventricular ejection fraction, by estimation, is 10-15%. The left  ventricle has severely decreased function. The left ventricle demonstrates global hypokinesis. The left ventricular internal cavity size was severely dilated. Indeterminate diastolic filling due to E-A fusion.   2. Right ventricular systolic function is severely reduced. The right ventricular size is mildly enlarged. There is moderately elevated pulmonary artery systolic pressure. The estimated right ventricular  systolic pressure is 45.7 mmHg.   3. Left atrial size was mildly dilated.   4. The mitral valve is grossly normal. Severe mitral valve regurgitation. No evidence of mitral stenosis.   5. The aortic valve is tricuspid. There is mild thickening of the aortic valve. Aortic valve regurgitation is not visualized. No aortic stenosis is present.   6. The inferior vena cava is normal in size with <50% respiratory variability, suggesting right atrial pressure of 8 mmHg.   Comparison(s): Changes from prior study are noted. EF unchanged. RVSP elevated compared to prior. Severe mitral regurgitation is present that is secondary to cardiomyopathy.   Conclusion(s)/Recommendation(s): No left ventricular mural or apical thrombus/thrombi.   R/LHC, 07/30/2018 Mid LM to Dist LM lesion is 20% stenosed. Prox LAD lesion is 80% stenosed. Ost 1st Diag lesion is 80% stenosed. Mid Cx lesion is 80% stenosed. SVG and is normal in caliber. The graft exhibits no disease. LIMA and is normal in caliber. The graft exhibits no disease.   1.  Significant two-vessel coronary artery disease involving proximal LAD at the bifurcation of first diagonal as well as mid left circumflex.  Patent grafts including LIMA to LAD and SVG to diagonal.  The LAD gets mostly antegrade flow but in spite of that the LIMA is not atretic.  Left circumflex distribution area is medium in size. 2.  Right heart catheterization showed mildly elevated filling pressures with pulmonary wedge pressure of 13 mmHg, mild pulmonary hypertension at 38 over  40 mmHg and severely reduced cardiac output at 3.13 with an index of 1.81.    Assessment and Plan     #) ICM #) LBBB #) s/p CRT-D Device functioning well, see paceart for details High VP LV lead threshold slightly more elevated than chronic level   #) atrial tach #) high risk medication monitoring - amiodarone Well-controlled on amiodarone 200mg  daily Update amio labs today   #) HFrEF NYHA II symptoms, activity mostly limited by bilateral knee pain Encouraged non-weight bearing exercises, like swimming, floor pedal bike Warm and dry on exam Will stop coreg and transition to 12.5mg  toprol nightly Continue entresto 24-26, jardiance Do not favor starting spiro with potassium at 4.7 Diuretic - 20mg  4 days a week Recommended referral to Adv Heart failure team         Current medicines are reviewed at length with the patient today.   The patient has concerns regarding her medicines.  The following changes were made today:   STOP coreg START 12.5mg  toprol nightly  Labs/ tests ordered today include:  Orders Placed This Encounter  Procedures   Comprehensive metabolic panel   TSH   T4   AMB referral  to CHF clinic   EKG 12-Lead     Disposition: Follow up with Dr. Lalla Brothers or EP APP in 6 months   Signed, Sherie Don, NP  03/24/23  4:05 PM  Electrophysiology [facility]

## 2023-03-25 LAB — COMPREHENSIVE METABOLIC PANEL
ALT: 9 [IU]/L (ref 0–32)
AST: 14 [IU]/L (ref 0–40)
Albumin: 4.5 g/dL (ref 3.8–4.8)
Alkaline Phosphatase: 97 [IU]/L (ref 44–121)
BUN/Creatinine Ratio: 14 (ref 12–28)
BUN: 20 mg/dL (ref 8–27)
Bilirubin Total: 0.2 mg/dL (ref 0.0–1.2)
CO2: 28 mmol/L (ref 20–29)
Calcium: 9.8 mg/dL (ref 8.7–10.3)
Chloride: 103 mmol/L (ref 96–106)
Creatinine, Ser: 1.43 mg/dL — ABNORMAL HIGH (ref 0.57–1.00)
Globulin, Total: 2.6 g/dL (ref 1.5–4.5)
Glucose: 85 mg/dL (ref 70–99)
Potassium: 4.8 mmol/L (ref 3.5–5.2)
Sodium: 144 mmol/L (ref 134–144)
Total Protein: 7.1 g/dL (ref 6.0–8.5)
eGFR: 37 mL/min/{1.73_m2} — ABNORMAL LOW (ref >59)

## 2023-03-25 LAB — T4: T4, Total: 2.5 ug/dL — ABNORMAL LOW (ref 4.5–12.0)

## 2023-03-25 LAB — TSH: TSH: 74.6 u[IU]/mL — ABNORMAL HIGH (ref 0.450–4.500)

## 2023-03-27 ENCOUNTER — Other Ambulatory Visit: Payer: Self-pay | Admitting: *Deleted

## 2023-03-27 ENCOUNTER — Other Ambulatory Visit: Payer: Self-pay | Admitting: Cardiology

## 2023-03-27 MED ORDER — LEVOTHYROXINE SODIUM 100 MCG PO TABS: 100.0000 ug | ORAL_TABLET | Freq: Every day | ORAL | 0 refills | Status: DC

## 2023-04-05 ENCOUNTER — Ambulatory Visit: Payer: Medicare HMO | Attending: Cardiology | Admitting: Cardiology

## 2023-04-05 ENCOUNTER — Encounter: Payer: Self-pay | Admitting: Cardiology

## 2023-04-05 VITALS — BP 136/100 | HR 76 | Wt 177.0 lb

## 2023-04-05 DIAGNOSIS — I5042 Chronic combined systolic (congestive) and diastolic (congestive) heart failure: Secondary | ICD-10-CM

## 2023-04-05 DIAGNOSIS — E785 Hyperlipidemia, unspecified: Secondary | ICD-10-CM | POA: Diagnosis not present

## 2023-04-05 DIAGNOSIS — I255 Ischemic cardiomyopathy: Secondary | ICD-10-CM

## 2023-04-05 DIAGNOSIS — I4719 Other supraventricular tachycardia: Secondary | ICD-10-CM | POA: Diagnosis not present

## 2023-04-05 DIAGNOSIS — I25118 Atherosclerotic heart disease of native coronary artery with other forms of angina pectoris: Secondary | ICD-10-CM | POA: Diagnosis not present

## 2023-04-05 DIAGNOSIS — J432 Centrilobular emphysema: Secondary | ICD-10-CM | POA: Diagnosis not present

## 2023-04-05 DIAGNOSIS — Z95810 Presence of automatic (implantable) cardiac defibrillator: Secondary | ICD-10-CM

## 2023-04-05 DIAGNOSIS — I10 Essential (primary) hypertension: Secondary | ICD-10-CM | POA: Diagnosis not present

## 2023-04-05 MED ORDER — BISOPROLOL FUMARATE 5 MG PO TABS: 2.5000 mg | ORAL_TABLET | Freq: Every day | ORAL | 3 refills | Status: DC

## 2023-04-05 NOTE — Patient Instructions (Addendum)
DISCONTINUE AMIODARONE AND METOPROLOL  START BISOPROLOL 2.5 MG ONCE DAILY  RECORD YOUR BLOOD PRESSURE AND HEART RATE DAILY AND BRING TO YOUR NEXT APPOINTMENT

## 2023-04-05 NOTE — Progress Notes (Signed)
ADVANCED HEART FAILURE CLINIC NOTE  Referring Physician: Sherie Don, NP  Primary Care: Reubin Milan, MD Primary Cardiologist:  HPI: Dana [name] is a 77 y.o. female with HFrEF, CRT-D, CAD s/p CABG, CKD, tobacco use presenting today to establish care.  Her cardiac history dates back to 2013 when she was diagnosed with heart failure with reduced ejection fraction with a EF as low as 15% by echocardiogram.  Left heart cath at that time with severe multivessel CAD status post two-vessel CABG in October 2013 with placement of epicardial pacemaker at the posterior wall.  She was lost to follow-up until 2020 when she presented again with HFrEF exacerbation repeat left heart catheterization at that time with patent saphenous vein grafts and LIMA.[name]  She had a repeat echocardiogram in 2022 with LVEF of 10 to 15% and underwent CRT-D placement in June 2023.  Her most recent echo in October 2023 demonstrated an EF of 25 to 30% with a dilated LV cavity and mildly reduced RV function.  Froma f unctioanl standpoint, Dana [name] reports taht she does fairly well. She lives by herself across from her daughter. She mops, cleans and performs all ADLs independently. She has no PND but does become short of breath with even moderate exertion.    Activity level/exercise tolerance:  NYHA IIB; limited by severe lower extremity arthritis but otherwise very active Orthopnea:  Sleeps on 2 pillows Paroxysmal noctural dyspnea:  no Chest pain/pressure:  no Orthostatic lightheadedness:  no Palpitations:  no Lower extremity edema:  no Presyncope/syncope:  no Cough:  no  Past Medical History:  Diagnosis Date   CHF (congestive heart failure) (HCC)    Complication of anesthesia    "difficulty waking up, it was all day long"   Coronary artery disease    Hyperlipidemia    LBBB (left bundle branch block)    Psoriasis     Current Outpatient Medications  Medication Sig Dispense Refill    acetaminophen (TYLENOL) 325 MG tablet Take 2 tablets (650 mg total) by mouth every 4 (four) hours as needed for headache or mild pain.     albuterol (VENTOLIN HFA) 108 (90 Base) MCG/ACT inhaler TAKE 2 PUFFS BY MOUTH EVERY 6 HOURS AS NEEDED FOR WHEEZE OR SHORTNESS OF BREATH 8.5 each 3   aspirin 81 MG chewable tablet Chew 1 tablet (81 mg total) by mouth daily. 30 tablet 5   atorvastatin (LIPITOR) 80 MG tablet TAKE 1 TABLET BY MOUTH EVERY DAY 90 tablet 3   bisoprolol (ZEBETA) 5 MG tablet Take 0.5 tablets (2.5 mg total) by mouth daily. 45 tablet 3   empagliflozin (JARDIANCE) 10 MG TABS tablet Take 1 tablet (10 mg total) by mouth daily. 90 tablet 3   isosorbide mononitrate (IMDUR) 30 MG 24 hr tablet TAKE 1 TABLET BY MOUTH EVERY DAY 90 tablet 0   sacubitril-valsartan (ENTRESTO) 24-26 MG Take 1 tablet by mouth 2 (two) times daily. 180 tablet 1   Tiotropium Bromide-Olodaterol (STIOLTO RESPIMAT) 2.5-2.5 MCG/ACT AERS Inhale 2 puffs into the lungs daily at 6 (six) AM. 4 g 12   torsemide (DEMADEX) 20 MG tablet Take 1 tablet (20 mg total) by mouth 4 (four) times a week. Take Monday, Wednesday, Friday, Saturday. 90 tablet 0   No current facility-administered medications for this visit.    Allergies  Allergen Reactions   Bevespi Aerosphere [Glycopyrrolate-Formoterol] Hives and Rash      Social History   Socioeconomic History   Marital status: Divorced    Spouse name:  Not on file   Number of children: Not on file   Years of education: Not on file   Highest education level: Not on file  Occupational History   Not on file  Tobacco Use   Smoking status: Some Days    Types: Cigarettes   Smokeless tobacco: Never   Tobacco comments:    Smokes one and half packs of cigarettes weekly    Vaping Use   Vaping status: Never Used  Substance and Sexual Activity   Alcohol use: No   Drug use: No   Sexual activity: Not on file  Other Topics Concern   Not on file  Social History Narrative   Not on file    Social Determinants of Health   Financial Resource Strain: Low Risk  (12/21/2022)   Overall Financial Resource Strain (CARDIA)    Difficulty of Paying Living Expenses: Not hard at all  Food Insecurity: No Food Insecurity (12/21/2022)   Hunger Vital Sign    Worried About Running Out of Food in the Last Year: Never true    Ran Out of Food in the Last Year: Never true  Transportation Needs: No Transportation Needs (12/21/2022)   PRAPARE - Administrator, Civil Service (Medical): No    Lack of Transportation (Non-Medical): No  Physical Activity: Inactive (12/21/2022)   Exercise Vital Sign    Days of Exercise per Week: 0 days    Minutes of Exercise per Session: 0 min  Stress: No Stress Concern Present (12/21/2022)   [employer] of Occupational Health - Occupational Stress Questionnaire    Feeling of Stress : Not at all  Social Connections: Socially Isolated (12/21/2022)   Social Connection and Isolation Panel [NHANES]    Frequency of Communication with Friends and Family: More than three times a week    Frequency of Social Gatherings with Friends and Family: Not on file    Attends Religious Services: Never    Database administrator or Organizations: No    Attends Banker Meetings: Never    Marital Status: Divorced  Catering manager Violence: Not At Risk (12/21/2022)   Humiliation, Afraid, Rape, and Kick questionnaire    Fear of Current or Ex-Partner: No    Emotionally Abused: No    Physically Abused: No    Sexually Abused: No      Family History  Problem Relation Age of Onset   Heart disease Father     PHYSICAL EXAM: Vitals:   04/05/23 1519  BP: (!) 136/100  Pulse: 76  SpO2: 96%   GENERAL: Well nourished, well developed, and in no apparent distress at rest.  HEENT: Negative for arcus senilis or xanthelasma. There is no scleral icterus.  The mucous membranes are pink and moist.   NECK: Supple, No masses. Normal carotid upstrokes without bruits.  No masses or thyromegaly.    CHEST: There are no chest wall deformities. There is no chest wall tenderness. Respirations are unlabored.  Lungs- CTA B/L CARDIAC:  JVP: 8 cm H2O         Normal S1, S2  Normal rate with regular rhythm. No murmurs, rubs or gallops.  Pulses are 2+ and symmetrical in upper and lower extremities. No edema.  ABDOMEN: Soft, non-tender, non-distended. There are no masses or hepatomegaly. There are normal bowel sounds.  EXTREMITIES: Warm and well perfused with no cyanosis, clubbing.  LYMPHATIC: No axillary or supraclavicular lymphadenopathy.  NEUROLOGIC: Patient is oriented x3 with no focal or lateralizing neurologic deficits.  PSYCH: Patients affect is appropriate, there is no evidence of anxiety or depression.  SKIN: Warm and dry; no lesions or wounds.   DATA REVIEW  ECG: 03/24/23: A-V dual paced rhythm  As per my personal interpretation  ECHO: 02/23/22: LVEF 25%-30% As per my personal interpretation  CATH: Mid LM to Dist LM lesion is 20% stenosed. Prox LAD lesion is 80% stenosed. Ost 1st Diag lesion is 80% stenosed. Mid Cx lesion is 80% stenosed. SVG and is normal in caliber. The graft exhibits no disease. LIMA and is normal in caliber. The graft exhibits no disease.   1.  Significant two-vessel coronary artery disease involving proximal LAD at the bifurcation of first diagonal as well as mid left circumflex.  Patent grafts including LIMA to LAD and SVG to diagonal.  The LAD gets mostly antegrade flow but in spite of that the LIMA is not atretic.  Left circumflex distribution area is medium in size. 2.  Right heart catheterization showed mildly elevated filling pressures with pulmonary wedge pressure of 13 mmHg, mild pulmonary hypertension at 38 over 40 mmHg and severely reduced cardiac output at 3.13 with an index of 1.81.   ASSESSMENT & PLAN:  Heart failure with reduced ejection fraction Etiology of HF: Ischemic cardiomyopathy; likely with a mixed  nonischemic component now. Will plan on repeat TTE, last TTE from 10/23 NYHA class / AHA Stage:III Volume status & Diuretics: torsemide 20mg  daily Vasodilators: Entresto 24/26 mg twice daily; hypertensive today but reports that her BP is well controlled at home. Will provide BP log.  Beta-Blocker: due to severe COPD, will switch to bisoprolol 2.5mg  daily HQI:ONGEX spironolactone 12.5mg  daily; BMP/BNP today; repeat BMP in 7-10 days.  Cardiometabolic:jardiance 10mg  daily Devices therapies & Valvulopathies:CRT-D in place Advanced therapies:Not a candidate due to age, tobacco use and comorbidities  2.  Coronary artery disease -Status post CABG in 2013 -Most recent cath in March 2020 with patent LIMA to LAD and SVG to diagonal.  Cardiac index at that time of 1.8 -Continue aspirin 81 mg daily  3.  Centrilobular emphysema/COPD -No recent COPD exacerbations however continues to smoke.  4.  Tobacco use -Discussed smoking cessation  5. Hyperlipidemia - LDL well controlled - continue lipitor 80mg  daily  6. CKD - Baseline sCr of ~1.5  7. Hx of atrial tachycardia - Followed by EP - Can likely D/C amiodarone; will discuss with EP.   8. Hypertension - Well controlled - Continue Entresto 24/26mg  BID - Repeat BMP today.  - Will provide BP log at follow up.   9. Psoriasis - Currently not on any medications; does not wish to be on many meds.   10. Hypothyroidism - Started on synthroid for severe hypothyroidism - TSH today   Dana [name] Advanced Heart Failure Mechanical Circulatory Support

## 2023-04-07 ENCOUNTER — Encounter: Payer: Self-pay | Admitting: Cardiology

## 2023-04-18 ENCOUNTER — Ambulatory Visit: Payer: Medicare HMO | Admitting: Cardiovascular Disease

## 2023-04-19 ENCOUNTER — Ambulatory Visit (INDEPENDENT_AMBULATORY_CARE_PROVIDER_SITE_OTHER): Payer: Medicare HMO

## 2023-04-19 DIAGNOSIS — I5042 Chronic combined systolic (congestive) and diastolic (congestive) heart failure: Secondary | ICD-10-CM

## 2023-04-19 DIAGNOSIS — I255 Ischemic cardiomyopathy: Secondary | ICD-10-CM

## 2023-04-19 LAB — CUP PACEART REMOTE DEVICE CHECK
Battery Remaining Longevity: 35 mo
Battery Remaining Percentage: 67 %
Battery Voltage: 2.96 V
Brady Statistic AP VP Percent: 55 %
Brady Statistic AP VS Percent: 1 %
Brady Statistic AS VP Percent: 45 %
Brady Statistic AS VS Percent: 1 %
Brady Statistic RA Percent Paced: 55 %
Date Time Interrogation Session: 20241218020017
HighPow Impedance: 73 Ohm
HighPow Impedance: 73 Ohm
Implantable Lead Connection Status: 753985
Implantable Lead Connection Status: 753985
Implantable Lead Connection Status: 753985
Implantable Lead Implant Date: 20131024
Implantable Lead Implant Date: 20230606
Implantable Lead Implant Date: 20230606
Implantable Lead Location: 753859
Implantable Lead Location: 753860
Implantable Lead Location: 753862
Implantable Lead Model: 5071
Implantable Pulse Generator Implant Date: 20230606
Lead Channel Impedance Value: 290 Ohm
Lead Channel Impedance Value: 430 Ohm
Lead Channel Impedance Value: 660 Ohm
Lead Channel Pacing Threshold Amplitude: 0.5 V
Lead Channel Pacing Threshold Amplitude: 1 V
Lead Channel Pacing Threshold Amplitude: 2.5 V
Lead Channel Pacing Threshold Pulse Width: 0.5 ms
Lead Channel Pacing Threshold Pulse Width: 0.5 ms
Lead Channel Pacing Threshold Pulse Width: 1 ms
Lead Channel Sensing Intrinsic Amplitude: 12 mV
Lead Channel Sensing Intrinsic Amplitude: 3.1 mV
Lead Channel Setting Pacing Amplitude: 2 V
Lead Channel Setting Pacing Amplitude: 2 V
Lead Channel Setting Pacing Amplitude: 3 V
Lead Channel Setting Pacing Pulse Width: 0.5 ms
Lead Channel Setting Pacing Pulse Width: 1 ms
Lead Channel Setting Sensing Sensitivity: 0.5 mV
Pulse Gen Serial Number: 5538587
Zone Setting Status: 755011

## 2023-05-08 ENCOUNTER — Ambulatory Visit (INDEPENDENT_AMBULATORY_CARE_PROVIDER_SITE_OTHER): Payer: Medicare HMO | Admitting: Internal Medicine

## 2023-05-08 ENCOUNTER — Encounter: Payer: Self-pay | Admitting: Internal Medicine

## 2023-05-08 VITALS — BP 127/84 | HR 78 | Ht 60.0 in | Wt 178.0 lb

## 2023-05-08 DIAGNOSIS — E039 Hypothyroidism, unspecified: Secondary | ICD-10-CM | POA: Insufficient documentation

## 2023-05-08 DIAGNOSIS — I5042 Chronic combined systolic (congestive) and diastolic (congestive) heart failure: Secondary | ICD-10-CM | POA: Diagnosis not present

## 2023-05-08 DIAGNOSIS — M810 Age-related osteoporosis without current pathological fracture: Secondary | ICD-10-CM | POA: Insufficient documentation

## 2023-05-08 MED ORDER — ISOSORBIDE MONONITRATE ER 30 MG PO TB24: 30.0000 mg | ORAL_TABLET | Freq: Every day | ORAL | 3 refills | Status: AC

## 2023-05-08 MED ORDER — LEVOTHYROXINE SODIUM 100 MCG PO TABS: 100.0000 ug | ORAL_TABLET | Freq: Every day | ORAL | 0 refills | Status: DC

## 2023-05-08 NOTE — Progress Notes (Signed)
 Date:  05/08/2023   Name:  Dana [name]   DOB:  1946-01-31   MRN:  [id number]   Chief Complaint: Hypothyroidism  Thyroid  Problem Presents for follow-up visit. Symptoms include fatigue. Patient reports no anxiety, palpitations or weight gain.  TSH was very high on labs done by Cardiology.  Levothyroxine  started - she took the last tablet three days ago.    Review of Systems  Constitutional:  Positive for fatigue. Negative for weight gain.  HENT:  Positive for hearing loss. Negative for trouble swallowing.   Respiratory:  Positive for shortness of breath. Negative for chest tightness and wheezing.   Cardiovascular:  Negative for chest pain and palpitations.  Gastrointestinal:  Negative for abdominal pain.  Neurological:  Negative for dizziness and headaches.  Psychiatric/Behavioral:  Negative for dysphoric mood and sleep disturbance. The patient is not nervous/anxious.      Lab Results  Component Value Date   NA 144 03/24/2023   K 4.8 03/24/2023   CO2 28 03/24/2023   GLUCOSE 85 03/24/2023   BUN 20 03/24/2023   CREATININE 1.43 (H) 03/24/2023   CALCIUM  9.8 03/24/2023   EGFR 37 (L) 03/24/2023   GFRNONAA 34 (L) 09/24/2021   Lab Results  Component Value Date   CHOL 148 10/18/2022   HDL 61 10/18/2022   LDLCALC 64 10/18/2022   LDLDIRECT 124 (H) 02/05/2021   TRIG 130 10/18/2022   CHOLHDL 2.4 10/18/2022   Lab Results  Component Value Date   TSH 74.600 (H) 03/24/2023   Lab Results  Component Value Date   HGBA1C 5.5 07/26/2018   Lab Results  Component Value Date   WBC 6.3 10/18/2022   HGB 12.9 10/18/2022   HCT 39.5 10/18/2022   MCV 99 (H) 10/18/2022   PLT 259 10/18/2022   Lab Results  Component Value Date   ALT 9 03/24/2023   AST 14 03/24/2023   ALKPHOS 97 03/24/2023   BILITOT 0.2 03/24/2023   Lab Results  Component Value Date   VD25OH 14.3 (L) 01/31/2023     Patient Active Problem List   Diagnosis Date Noted   Age-related osteoporosis without current  pathological fracture 05/08/2023   Hypothyroidism (acquired) 05/08/2023   Secondary hyperparathyroidism of renal origin (HCC) 02/01/2023   Sensorineural hearing loss (SNHL) of both ears 01/31/2023   Aortic atherosclerosis (HCC) 11/15/2022   CKD (chronic kidney disease) stage 4, GFR 15-29 ml/min (HCC) 11/15/2022   Primary osteoarthritis of both knees 11/15/2022   Tinnitus of both ears 11/15/2022   Presence of biventricular AICD 11/15/2022   Ischemic cardiomyopathy 10/05/2021   Atrial tachycardia (HCC) 07/24/2021   Psoriasis 01/28/2021   NSTEMI (non-ST elevated myocardial infarction) (HCC)    Coronary artery disease of native artery of native heart with stable angina pectoris (HCC) 09/09/2018   Centrilobular emphysema (HCC) 09/09/2018   Combined systolic and diastolic congestive heart failure (HCC) 07/25/2018   Mitral regurgitation 02/22/2012   Tobacco abuse 02/18/2012   Left bundle branch block 02/18/2012   Iron deficiency 02/18/2012    Allergies  Allergen Reactions   Bevespi  Aerosphere [Glycopyrrolate -Formoterol] Hives and Rash    Past Surgical History:  Procedure Laterality Date   BIV ICD INSERTION CRT-D N/A 10/05/2021   Procedure: BIV ICD INSERTION CRT-D;  Surgeon: Cindie Ole DASEN, MD;  Location: [facility] INVASIVE CV LAB;  Service: Cardiovascular;  Laterality: N/A;   CARDIAC CATHETERIZATION  02/17/2012   CORONARY ARTERY BYPASS GRAFT  02/23/2012   Procedure: CORONARY ARTERY BYPASS GRAFTING (CABG);  Surgeon: Elspeth  JAYSON Millers, MD;  Location: [facility] OR;  Service: Open Heart Surgery;  Laterality: N/A;  coronary artery bypass graft on pump times two utlizing left internal mammary artery and right greater saphenous vein harvested endoscopically, transesophageal echocardiogram    NO PAST SURGERIES     RIGHT/LEFT HEART CATH AND CORONARY/GRAFT ANGIOGRAPHY N/A 07/30/2018   Procedure: RIGHT/LEFT HEART CATH AND CORONARY/GRAFT ANGIOGRAPHY;  Surgeon: Darron Deatrice LABOR, MD;  Location: [facility] INVASIVE  CV LAB;  Service: Cardiovascular;  Laterality: N/A;   TEE WITHOUT CARDIOVERSION  02/22/2012   Procedure: TRANSESOPHAGEAL ECHOCARDIOGRAM (TEE);  Surgeon: Toribio JONELLE Fuel, MD;  Location: [facility] ENDOSCOPY;  Service: Cardiovascular;  Laterality: N/A;    Social History   Tobacco Use   Smoking status: Some Days    Types: Cigarettes   Smokeless tobacco: Never   Tobacco comments:    Smokes one and half packs of cigarettes weekly    Vaping Use   Vaping status: Never Used  Substance Use Topics   Alcohol use: No   Drug use: No     Medication list has been reviewed and updated.  Current Meds  Medication Sig   acetaminophen  (TYLENOL ) 325 MG tablet Take 2 tablets (650 mg total) by mouth every 4 (four) hours as needed for headache or mild pain.   albuterol  (VENTOLIN  HFA) 108 (90 Base) MCG/ACT inhaler TAKE 2 PUFFS BY MOUTH EVERY 6 HOURS AS NEEDED FOR WHEEZE OR SHORTNESS OF BREATH   aspirin  81 MG chewable tablet Chew 1 tablet (81 mg total) by mouth daily.   atorvastatin  (LIPITOR ) 80 MG tablet TAKE 1 TABLET BY MOUTH EVERY DAY   bisoprolol  (ZEBETA ) 5 MG tablet Take 0.5 tablets (2.5 mg total) by mouth daily.   empagliflozin  (JARDIANCE ) 10 MG TABS tablet Take 1 tablet (10 mg total) by mouth daily.   sacubitril -valsartan  (ENTRESTO ) 24-26 MG Take 1 tablet by mouth 2 (two) times daily.   Tiotropium Bromide -Olodaterol (STIOLTO RESPIMAT ) 2.5-2.5 MCG/ACT AERS Inhale 2 puffs into the lungs daily at 6 (six) AM.   torsemide  (DEMADEX ) 20 MG tablet Take 1 tablet (20 mg total) by mouth 4 (four) times a week. Take Monday, Wednesday, Friday, Saturday.   [DISCONTINUED] isosorbide  mononitrate (IMDUR ) 30 MG 24 hr tablet TAKE 1 TABLET BY MOUTH EVERY DAY       05/08/2023    4:21 PM 01/31/2023    1:13 PM 11/15/2022    3:37 PM  GAD 7 : Generalized Anxiety Score  Nervous, Anxious, on Edge 0 0 0  Control/stop worrying 0 0 0  Worry too much - different things 0 0 0  Trouble relaxing 0 0 0  Restless 0 0 0  Easily  annoyed or irritable 0 0 0  Afraid - awful might happen 0 0 0  Total GAD 7 Score 0 0 0  Anxiety Difficulty Not difficult at all  Not difficult at all       05/08/2023    4:21 PM 01/31/2023    1:13 PM 12/21/2022    2:35 PM  Depression screen PHQ 2/9  Decreased Interest 0 0 0  Down, Depressed, Hopeless 0 0 0  PHQ - 2 Score 0 0 0  Altered sleeping 0 0 0  Tired, decreased energy 2 1 0  Change in appetite 2 1 0  Feeling bad or failure about yourself  0 0 0  Trouble concentrating 0 0 0  Moving slowly or fidgety/restless 0 0 0  Suicidal thoughts 0 0 0  PHQ-9 Score 4 2 0  Difficult doing work/chores Not difficult at all Somewhat difficult Not difficult at all    BP Readings from Last 3 Encounters:  05/08/23 127/84  04/05/23 (!) 136/100  03/24/23 112/62    Physical Exam Vitals and nursing note reviewed.  Constitutional:      General: She is not in acute distress.    Appearance: She is well-developed. She is obese.  HENT:     Head: Normocephalic and atraumatic.  Neck:     Thyroid : No thyroid  mass, thyromegaly or thyroid  tenderness.  Cardiovascular:     Rate and Rhythm: Normal rate and regular rhythm.  Pulmonary:     Effort: Pulmonary effort is normal. No respiratory distress.     Breath sounds: No wheezing or rhonchi.  Musculoskeletal:     Cervical back: Normal range of motion.     Right lower leg: No edema.     Left lower leg: No edema.  Skin:    General: Skin is warm and dry.     Findings: No rash.  Neurological:     Mental Status: She is alert and oriented to person, place, and time.  Psychiatric:        Mood and Affect: Mood normal.        Behavior: Behavior normal.     Wt Readings from Last 3 Encounters:  05/08/23 178 lb (80.7 kg)  04/05/23 177 lb (80.3 kg)  03/24/23 179 lb 8 oz (81.4 kg)    BP 127/84   Pulse 78   Ht 5' (1.524 m)   Wt 178 lb (80.7 kg)   SpO2 97%   BMI 34.76 kg/m   Assessment and Plan:  Problem List Items Addressed This Visit        Unprioritized   Combined systolic and diastolic congestive heart failure (HCC)   Followed closely by Cardiology, heart failure clinic and electophysiology Will refill Imdur .      Relevant Medications   isosorbide  mononitrate (IMDUR ) 30 MG 24 hr tablet   Age-related osteoporosis without current pathological fracture   Recent DEXA osteoporosis of the hip Continue calcium  and vitamin D       Hypothyroidism (acquired) - Primary   Recently started on Levothyroxine  100 mcg. Continue this dose, check labs and advise Recheck in 3 months.      Relevant Medications   levothyroxine  (SYNTHROID ) 100 MCG tablet   Other Relevant Orders   TSH+T4F+T3Free    Return in about 3 months (around 08/06/2023).    Leita HILARIO Adie, MD [facility] [place]

## 2023-05-08 NOTE — Assessment & Plan Note (Signed)
 Recent DEXA osteoporosis of the hip Continue calcium and vitamin D

## 2023-05-08 NOTE — Assessment & Plan Note (Signed)
 Recently started on Levothyroxine 100 mcg. Continue this dose, check labs and advise Recheck in 3 months.

## 2023-05-08 NOTE — Assessment & Plan Note (Signed)
 Followed closely by Cardiology, heart failure clinic and electophysiology Will refill Imdur.

## 2023-05-09 LAB — TSH+T4F+T3FREE
Free T4: 1.1 ng/dL (ref 0.82–1.77)
T3, Free: 2.2 pg/mL (ref 2.0–4.4)
TSH: 9.36 u[IU]/mL — ABNORMAL HIGH (ref 0.450–4.500)

## 2023-05-15 ENCOUNTER — Encounter: Payer: Medicare HMO | Admitting: Cardiology

## 2023-05-23 ENCOUNTER — Ambulatory Visit: Payer: Medicare HMO | Attending: Cardiology | Admitting: Cardiology

## 2023-05-23 VITALS — BP 122/50 | HR 61 | Wt 180.0 lb

## 2023-05-23 DIAGNOSIS — I5042 Chronic combined systolic (congestive) and diastolic (congestive) heart failure: Secondary | ICD-10-CM

## 2023-05-23 MED ORDER — SPIRONOLACTONE 25 MG PO TABS: 25.0000 mg | ORAL_TABLET | Freq: Every day | ORAL | 3 refills | Status: DC

## 2023-05-23 NOTE — Patient Instructions (Signed)
Labs done today. We will contact you only if your labs are abnormal.  START Spironolactone 25mg  (1 tablet) by mouth daily.   No other medication changes were made. Please continue all current medications as prescribed.  Your physician recommends that you schedule a follow-up appointment in: 3 months with Dr. Gasper Lloyd. Please contact our office in February to schedule a April 2025 appointment.   If you have any questions or concerns before your next appointment please send [place] a message through [place] or call our office at [phone].    TO LEAVE A MESSAGE FOR THE NURSE SELECT OPTION 2, PLEASE LEAVE A MESSAGE INCLUDING: YOUR NAME DATE OF BIRTH CALL BACK NUMBER REASON FOR CALL**this is important as we prioritize the call backs  YOU WILL RECEIVE A CALL BACK THE SAME DAY AS LONG AS YOU CALL BEFORE 4:00 PM   Do the following things EVERYDAY: Weigh yourself in the morning before breakfast. Write it down and keep it in a log. Take your medicines as prescribed Eat low salt foods--Limit salt (sodium) to 2000 mg per day.  Stay as active as you can everyday Limit all fluids for the day to less than 2 liters   At the [facility], you and your health needs are our priority. As part of our continuing mission to provide you with exceptional heart care, we have created designated Provider Care Teams. These Care Teams include your primary Cardiologist (physician) and Advanced Practice Providers (APPs- Physician Assistants and Nurse Practitioners) who all work together to provide you with the care you need, when you need it.   You may see any of the following providers on your designated Care Team at your next follow up: Dr Arvilla Meres Dr Marca Ancona Dr. Marcos Eke, NP Robbie Lis, [place] [facility] [place], [place] Brynda Peon, NP Karle Plumber, PharmD   Please be sure to bring in all your medications bottles to every appointment.    Thank  you for choosing [facility]

## 2023-05-24 LAB — BRAIN NATRIURETIC PEPTIDE: BNP: 152.2 pg/mL — ABNORMAL HIGH (ref 0.0–100.0)

## 2023-05-24 LAB — BASIC METABOLIC PANEL
BUN/Creatinine Ratio: 23 (ref 12–28)
BUN: 28 mg/dL — ABNORMAL HIGH (ref 8–27)
CO2: 23 mmol/L (ref 20–29)
Calcium: 9.8 mg/dL (ref 8.7–10.3)
Chloride: 103 mmol/L (ref 96–106)
Creatinine, Ser: 1.21 mg/dL — ABNORMAL HIGH (ref 0.57–1.00)
Glucose: 95 mg/dL (ref 70–99)
Potassium: 4.5 mmol/L (ref 3.5–5.2)
Sodium: 144 mmol/L (ref 134–144)
eGFR: 46 mL/min/{1.73_m2} — ABNORMAL LOW (ref >59)

## 2023-05-24 NOTE — Progress Notes (Signed)
ADVANCED [facility] FAILURE CLINIC NOTE  Referring Physician: Reubin Milan, MD  Primary Care: Reubin Milan, MD Primary Cardiologist:  HPI: Dana [name] is a 78 y.o. female with HFrEF, CRT-D, CAD s/p CABG, CKD, tobacco use presenting today to establish care.  Her cardiac history dates back to 2013 when she was diagnosed with heart failure with reduced ejection fraction with a EF as low as 15% by echocardiogram.  Left heart cath at that time with severe multivessel CAD status post two-vessel CABG in October 2013 with placement of epicardial pacemaker at the posterior wall.  She was lost to follow-up until 2020 when she presented again with HFrEF exacerbation repeat left heart catheterization at that time with patent saphenous vein grafts and LIMA.[name]  She had a repeat echocardiogram in 2022 with LVEF of 10 to 15% and underwent CRT-D placement in June 2023.  Her most recent echo in October 2023 demonstrated an EF of 25 to 30% with a dilated LV cavity and mildly reduced RV function.  Patient overall doing fairly well at home, brings in her BP log today. Shows majority SBP 110-120, occasional 130s. No active complaints, has not had worsening swelling or shortness of breath.    Activity level/exercise tolerance:  NYHA IIB; limited by severe lower extremity arthritis but otherwise very active  Past Medical History:  Diagnosis Date   CHF (congestive heart failure) (HCC)    Complication of anesthesia    "difficulty waking up, it was all day long"   Coronary artery disease    Hyperlipidemia    LBBB (left bundle branch block)    Psoriasis     Current Outpatient Medications  Medication Sig Dispense Refill   acetaminophen (TYLENOL) 325 MG tablet Take 2 tablets (650 mg total) by mouth every 4 (four) hours as needed for headache or mild pain.     albuterol (VENTOLIN HFA) 108 (90 Base) MCG/ACT inhaler TAKE 2 PUFFS BY MOUTH EVERY 6 HOURS AS NEEDED FOR WHEEZE OR SHORTNESS OF BREATH 8.5 each 3    aspirin 81 MG chewable tablet Chew 1 tablet (81 mg total) by mouth daily. 30 tablet 5   atorvastatin (LIPITOR) 80 MG tablet TAKE 1 TABLET BY MOUTH EVERY DAY 90 tablet 3   bisoprolol (ZEBETA) 5 MG tablet Take 0.5 tablets (2.5 mg total) by mouth daily. 45 tablet 3   empagliflozin (JARDIANCE) 10 MG TABS tablet Take 1 tablet (10 mg total) by mouth daily. 90 tablet 3   isosorbide mononitrate (IMDUR) 30 MG 24 hr tablet Take 1 tablet (30 mg total) by mouth daily. 90 tablet 3   levothyroxine (SYNTHROID) 100 MCG tablet Take 1 tablet (100 mcg total) by mouth daily before breakfast. 90 tablet 0   sacubitril-valsartan (ENTRESTO) 24-26 MG Take 1 tablet by mouth 2 (two) times daily. 180 tablet 1   spironolactone (ALDACTONE) 25 MG tablet Take 1 tablet (25 mg total) by mouth daily. 90 tablet 3   Tiotropium Bromide-Olodaterol (STIOLTO RESPIMAT) 2.5-2.5 MCG/ACT AERS Inhale 2 puffs into the lungs daily at 6 (six) AM. 4 g 12   torsemide (DEMADEX) 20 MG tablet Take 1 tablet (20 mg total) by mouth 4 (four) times a week. Take Monday, Wednesday, Friday, Saturday. 90 tablet 0   No current facility-administered medications for this visit.    Allergies  Allergen Reactions   Bevespi Aerosphere [Glycopyrrolate-Formoterol] Hives and Rash      Social History   Socioeconomic History   Marital status: Divorced    Spouse name: Not  on file   Number of children: Not on file   Years of education: Not on file   Highest education level: Not on file  Occupational History   Not on file  Tobacco Use   Smoking status: Some Days    Types: Cigarettes   Smokeless tobacco: Never   Tobacco comments:    Smokes one and half packs of cigarettes weekly    Vaping Use   Vaping status: Never Used  Substance and Sexual Activity   Alcohol use: No   Drug use: No   Sexual activity: Not on file  Other Topics Concern   Not on file  Social History Narrative   Not on file   Social Drivers of Health   Financial Resource Strain:  Low Risk  (12/21/2022)   Overall Financial Resource Strain (CARDIA)    Difficulty of Paying Living Expenses: Not hard at all  Food Insecurity: No Food Insecurity (12/21/2022)   Hunger Vital Sign    Worried About Running Out of Food in the Last Year: Never true    Ran Out of Food in the Last Year: Never true  Transportation Needs: No Transportation Needs (12/21/2022)   PRAPARE - Administrator, Civil Service (Medical): No    Lack of Transportation (Non-Medical): No  Physical Activity: Inactive (12/21/2022)   Exercise Vital Sign    Days of Exercise per Week: 0 days    Minutes of Exercise per Session: 0 min  Stress: No Stress Concern Present (12/21/2022)   [employer] of Occupational Health - Occupational Stress Questionnaire    Feeling of Stress : Not at all  Social Connections: Socially Isolated (12/21/2022)   Social Connection and Isolation Panel [NHANES]    Frequency of Communication with Friends and Family: More than three times a week    Frequency of Social Gatherings with Friends and Family: Not on file    Attends Religious Services: Never    Database administrator or Organizations: No    Attends Banker Meetings: Never    Marital Status: Divorced  Catering manager Violence: Not At Risk (12/21/2022)   Humiliation, Afraid, Rape, and Kick questionnaire    Fear of Current or Ex-Partner: No    Emotionally Abused: No    Physically Abused: No    Sexually Abused: No      Family History  Problem Relation Age of Onset   Heart disease Father     PHYSICAL EXAM: Vitals:   05/23/23 1515  BP: (!) 122/50  Pulse: 61  SpO2: 98%    GENERAL: Well nourished and in no apparent distress at rest.  HEENT: The mucous membranes are pink and moist.   PULM:  Normal work of breathing, clear to auscultation bilaterally. Respirations are unlabored.  CARDIAC:  JVP: not elevated         Normal rate with regular rhythm. No murmurs, rubs or gallops.  Trace edema.   ABDOMEN: Soft, non-tender, non-distended. NEUROLOGIC: Patient is oriented x3 with no focal or lateralizing neurologic deficits.  PSYCH: Patients affect is appropriate, there is no evidence of anxiety or depression.  SKIN: Warm and dry; no lesions or wounds. Warm and well perfused extremities.   DATA REVIEW  ECG: 03/24/23: A-V dual paced rhythm  As per my personal interpretation  ECHO: 02/23/22: LVEF 25%-30% As per my personal interpretation  CATH: Mid LM to Dist LM lesion is 20% stenosed. Prox LAD lesion is 80% stenosed. Ost 1st Diag lesion is 80% stenosed. Mid  Cx lesion is 80% stenosed. SVG and is normal in caliber. The graft exhibits no disease. LIMA and is normal in caliber. The graft exhibits no disease.   1.  Significant two-vessel coronary artery disease involving proximal LAD at the bifurcation of first diagonal as well as mid left circumflex.  Patent grafts including LIMA to LAD and SVG to diagonal.  The LAD gets mostly antegrade flow but in spite of that the LIMA is not atretic.  Left circumflex distribution area is medium in size. 2.  Right heart catheterization showed mildly elevated filling pressures with pulmonary wedge pressure of 13 mmHg, mild pulmonary hypertension at 38 over 40 mmHg and severely reduced cardiac output at 3.13 with an index of 1.81.   ASSESSMENT & PLAN:  Heart failure with reduced ejection fraction Etiology of HF: Ischemic cardiomyopathy; likely with a mixed nonischemic component now. NYHA class / AHA Stage:III Volume status & Diuretics: torsemide 20mg  daily Vasodilators: Entresto 24/26 mg twice daily;BP log reviewed, no room to titrate Beta-Blocker: Continue bisoprolol 2.5mg  daily MVH:QIONGEXB spironolactone to 25mg  daily; BMP ordered Cardiometabolic:jardiance 10mg  daily Devices therapies & Valvulopathies:CRT-D in place Advanced therapies:Not a candidate due to age, tobacco use and comorbidities  2.  Coronary artery disease -Status post  CABG in 2013 -Most recent cath in March 2020 with patent LIMA to LAD and SVG to diagonal.  Cardiac index at that time of 1.8 -Continue aspirin 81 mg daily  3.  Centrilobular emphysema/COPD -No recent COPD exacerbations however continues to smoke.  4.  Tobacco use -Discussed smoking cessation  5. Hyperlipidemia - LDL well controlled - continue lipitor 80mg  daily  6. CKD - Baseline sCr of ~1.5  7. Hx of atrial tachycardia - Followed by EP - Can likely D/C amiodarone; will discuss with EP.   8. Hypertension - Well controlled - Continue Entresto 24/26mg  BID - Repeat BMP today.   9. Psoriasis - Currently not on any medications; does not wish to be on many meds.   10. Hypothyroidism - Started on synthroid for severe hypothyroidism - TSH greatly improved  Clearnce Hasten

## 2023-05-26 NOTE — Progress Notes (Signed)
Remote ICD transmission.

## 2023-06-05 NOTE — Progress Notes (Signed)
 Evaluation Performed:  Follow-up visit  Date:  06/06/2023   ID:  Dana [name], DOB [birth year], MRN [id number]  Patient Location:  [address] [place] [address]   Provider location:   [facility], [place] office  PCP:  Justus Leita DEL, MD  Cardiologist:  Perla [facility]  Chief Complaint  Patient presents with   6 month follow up     Doing well.      History of Present Illness:    Dana [name] is a 78 y.o. female  past medical history of smoking,  anxiety attacks ,  [facility] in October 2013 with systolic CHF, ejection fraction 25% severe left ventricular dysfunction with an EF of 15% on echocardiogram,  cardiac catheterization showing severe proximal LAD and ostial diagonal disease, moderate proximal left circumflex disease,  transferred to [facility], had coronary bypass grafting x2 on 02/23/2012  with  epicardial pacemaker lead on the posterior lateral wall  Lost to follow-up/medication noncompliance In the hospital March 2020 exacerbation of her systolic CHF Catheterization March 2020 Still smoking, very rare Ejection fraction 25 to 30% in October 2023 Chronic kidney disease creatinine 1.5 Who f/u for acute on chronic diastolic and systolic CHF  Seen by myself in clinic June 2024 Seen by CHF clinic last May 23, 2023 Presents today with her daughter  Medications discussed in detail On Jardiance  10 daily, spironolactone  25 daily, bisoprolol  2.5 daily Entresto  24/26 twice daily, torsemide  20 daily  Lives alone She reports that she does not use a pillbox, takes small out of the prescription bottles 1 at a time Takes medications once a day, does not forget  Denies significant salt intake, no excessive fluid intake  Seen by EP September 2023 CRT-D implant 10/05/2021  system includes a tunneled LV epicardial lead.   Long history of smoking Activity limited secondary to arthritides  Echocardiogram October 2023 EF  25 to 30%, up from 10 to 15% in September 2022  RV function mildly reduced, mild to moderate MR  ICD downloads reviewed, no significant arrhythmia  Lab work reviewed Creatinine 1.2  EKG personally reviewed by myself on todays visit EKG Interpretation Date/Time:  Tuesday June 06 2023 16:12:39 EST Ventricular Rate:  81 PR Interval:  160 QRS Duration:  130 QT Interval:  474 QTC Calculation: 550 R Axis:   130  Text Interpretation: Atrial-sensed ventricular-paced rhythm When compared with ECG of 24-Mar-2023 14:41, Vent. rate has increased BY  11 BPM Confirmed by Perla Lye [id number]) on 06/06/2023 4:16:06 PM    Other past medical history reviewed Cath 07/2018, reviewed Mid LM to Dist LM lesion is 20% stenosed. Prox LAD lesion is 80% stenosed. Ost 1st Diag lesion is 80% stenosed. Mid Cx lesion is 80% stenosed. SVG and is normal in caliber. The graft exhibits no disease. LIMA and is normal in caliber. The graft exhibits no disease.   1.  Significant two-vessel coronary artery disease involving proximal LAD at the bifurcation of first diagonal as well as mid left circumflex.  Patent grafts including LIMA to LAD and SVG to diagonal.  The LAD gets mostly antegrade flow but in spite of that the LIMA is not atretic.  Left circumflex distribution area is medium in size. 2.  Right heart catheterization showed mildly elevated filling pressures with pulmonary wedge pressure of 13 mmHg, mild pulmonary hypertension at 38 over 40 mmHg and severely reduced cardiac output at 3.13 with an index of 1.81.  No need to revascularize the LAD given that the LIMA is patent.  PCI of the left circumflex is possible but I doubt it will make significant difference on ejection fraction.  This should be reserved for refractory angina.  Discharge from the hospital July 30, 2018 for acute on chronic diastolic and systolic CHF, COPD exacerbation   Echocardiogram 02/17/2012 showed ejection fraction less than  25%, severe global hypokinesis  Cardiac cath 02/17/2012 showing severe proximal LAD and ostial diagonal disease, bifurcation disease. Estimated at 95%. Moderate proximal left circumflex disease, small to moderate size vessel. Severely depressed ejection fraction estimated at 25%.   Prior CV studies:   The following studies were reviewed today:  TTE 07/26/2018 1. The left ventricle has severely reduced systolic function, with an ejection fraction < 20%. The cavity size was severely dilated. Global hypokinesis, Concern for severe hypokinesis/possible akinesis of the anterior/anterseptal wall/apical. Unable to  determine diastolic parameters  2. The right ventricle has mildly reduced systolic function. The cavity was normal. There is no increase in right ventricular wall thickness. Right ventricular systolic pressure is mildly elevated with an estimated pressure of 37.7 mmHg.  3. The aortic valve is grossly normal Moderate calcification of the aortic valve. Aortic valve regurgitation was not assessed by color flow Doppler.  4. Mitral valve regurgitation is moderate  5. Left atrial size was moderately dilated.    Cath 07/30/2018 Mid LM to Dist LM lesion is 20% stenosed. Prox LAD lesion is 80% stenosed. Ost 1st Diag lesion is 80% stenosed. Mid Cx lesion is 80% stenosed. SVG and is normal in caliber. The graft exhibits no disease. LIMA and is normal in caliber. The graft exhibits no disease.    1.  Significant two-vessel coronary artery disease involving proximal LAD at the bifurcation of first diagonal as well as mid left circumflex.  Patent grafts including LIMA to LAD and SVG to diagonal.  The LAD gets mostly antegrade flow but in spite of that the LIMA is not atretic.  Left circumflex distribution area is medium in size. 2.  Right heart catheterization showed mildly elevated filling pressures with pulmonary wedge pressure of 13 mmHg, mild pulmonary hypertension at 38 over 40 mmHg and  severely reduced cardiac output at 3.13 with an index of 1.81.   Recommendations: No need to revascularize the LAD given that the LIMA is patent.   PCI of the left circumflex is possible but I doubt it will make significant difference on ejection fraction.  This should be reserved for refractory angina.  Past Medical History:  Diagnosis Date   CHF (congestive heart failure) (HCC)    Complication of anesthesia    difficulty waking up, it was all day long   Coronary artery disease    Hyperlipidemia    LBBB (left bundle branch block)    Psoriasis    Past Surgical History:  Procedure Laterality Date   BIV ICD INSERTION CRT-D N/A 10/05/2021   Procedure: BIV ICD INSERTION CRT-D;  Surgeon: Cindie Ole DASEN, MD;  Location: [facility] INVASIVE CV LAB;  Service: Cardiovascular;  Laterality: N/A;   CARDIAC CATHETERIZATION  02/17/2012   CORONARY ARTERY BYPASS GRAFT  02/23/2012   Procedure: CORONARY ARTERY BYPASS GRAFTING (CABG);  Surgeon: Elspeth JAYSON Millers, MD;  Location: [facility] OR;  Service: Open Heart Surgery;  Laterality: N/A;  coronary artery bypass graft on pump times two utlizing left internal mammary artery and right greater saphenous vein harvested endoscopically, transesophageal echocardiogram    NO PAST SURGERIES  RIGHT/LEFT HEART CATH AND CORONARY/GRAFT ANGIOGRAPHY N/A 07/30/2018   Procedure: RIGHT/LEFT HEART CATH AND CORONARY/GRAFT ANGIOGRAPHY;  Surgeon: Darron Deatrice LABOR, MD;  Location: [facility] INVASIVE CV LAB;  Service: Cardiovascular;  Laterality: N/A;   TEE WITHOUT CARDIOVERSION  02/22/2012   Procedure: TRANSESOPHAGEAL ECHOCARDIOGRAM (TEE);  Surgeon: Toribio JONELLE Fuel, MD;  Location: [facility] ENDOSCOPY;  Service: Cardiovascular;  Laterality: N/A;     Current Meds  Medication Sig   acetaminophen  (TYLENOL ) 325 MG tablet Take 2 tablets (650 mg total) by mouth every 4 (four) hours as needed for headache or mild pain.   albuterol  (VENTOLIN  HFA) 108 (90 Base) MCG/ACT inhaler TAKE 2 PUFFS BY MOUTH  EVERY 6 HOURS AS NEEDED FOR WHEEZE OR SHORTNESS OF BREATH   aspirin  81 MG chewable tablet Chew 1 tablet (81 mg total) by mouth daily.   atorvastatin  (LIPITOR ) 80 MG tablet TAKE 1 TABLET BY MOUTH EVERY DAY   bisoprolol  (ZEBETA ) 5 MG tablet Take 0.5 tablets (2.5 mg total) by mouth daily.   empagliflozin  (JARDIANCE ) 10 MG TABS tablet Take 1 tablet (10 mg total) by mouth daily.   isosorbide  mononitrate (IMDUR ) 30 MG 24 hr tablet Take 1 tablet (30 mg total) by mouth daily.   levothyroxine  (SYNTHROID ) 100 MCG tablet Take 1 tablet (100 mcg total) by mouth daily before breakfast.   sacubitril -valsartan  (ENTRESTO ) 24-26 MG Take 1 tablet by mouth 2 (two) times daily.   spironolactone  (ALDACTONE ) 25 MG tablet Take 1 tablet (25 mg total) by mouth daily.   Tiotropium Bromide -Olodaterol (STIOLTO RESPIMAT ) 2.5-2.5 MCG/ACT AERS Inhale 2 puffs into the lungs daily at 6 (six) AM.   torsemide  (DEMADEX ) 20 MG tablet Take 1 tablet (20 mg total) by mouth 4 (four) times a week. Take Monday, Wednesday, Friday, Saturday.     Allergies:   Bevespi  aerosphere [glycopyrrolate -formoterol]   Social History   Tobacco Use   Smoking status: Some Days    Types: Cigarettes   Smokeless tobacco: Never   Tobacco comments:    Smokes one and half packs of cigarettes weekly    Vaping Use   Vaping status: Never Used  Substance Use Topics   Alcohol use: No   Drug use: No     Family Hx: The patient's family history includes Heart disease in her father.  ROS:   Please see the history of present illness.    Review of Systems  Constitutional: Negative.   HENT: Negative.    Respiratory: Negative.    Cardiovascular: Negative.   Gastrointestinal: Negative.   Musculoskeletal:  Positive for back pain.  Neurological: Negative.   Psychiatric/Behavioral: Negative.    All other systems reviewed and are negative.    Labs/Other Tests and Data Reviewed:    Recent Labs: 10/18/2022: Hemoglobin 12.9; Platelets 259 03/24/2023:  ALT 9 05/08/2023: TSH 9.360 05/23/2023: BNP 152.2; BUN 28; Creatinine, Ser 1.21; Potassium 4.5; Sodium 144   Recent Lipid Panel Lab Results  Component Value Date/Time   CHOL 148 10/18/2022 10:36 AM   CHOL 123 02/17/2012 03:14 AM   TRIG 130 10/18/2022 10:36 AM   TRIG 99 02/17/2012 03:14 AM   HDL 61 10/18/2022 10:36 AM   HDL 30 (L) 02/17/2012 03:14 AM   CHOLHDL 2.4 10/18/2022 10:36 AM   CHOLHDL 3.2 07/27/2018 04:17 AM   LDLCALC 64 10/18/2022 10:36 AM   LDLCALC 73 02/17/2012 03:14 AM   LDLDIRECT 124 (H) 02/05/2021 12:56 PM    Wt Readings from Last 3 Encounters:  06/06/23 178 lb 6 oz (80.9 kg)  05/23/23 180 lb (81.6 kg)  05/08/23 178 lb (80.7 kg)     Exam:    BP (!) 118/56 (BP Location: Left Arm, Patient Position: Sitting, Cuff Size: Normal)   Pulse 81   Ht 5' (1.524 m)   Wt 178 lb 6 oz (80.9 kg)   SpO2 96%   BMI 34.84 kg/m  Constitutional:  oriented to person, place, and time. No distress.  HENT:  Head: Grossly normal Eyes:  no discharge. No scleral icterus.  Neck: No JVD, no carotid bruits  Cardiovascular: Regular rate and rhythm, no murmurs appreciated Pulmonary/Chest: Clear to auscultation bilaterally, no wheezes or rails Abdominal: Soft.  no distension.  no tenderness.  Musculoskeletal: Normal range of motion Neurological:  normal muscle tone. Coordination normal. No atrophy Skin: Skin warm and dry Psychiatric: normal affect, pleasant  ASSESSMENT & PLAN:    Coronary artery disease of native artery of native heart with stable angina pectoris (HCC) Ischemic work-up in 2020, significant circumflex disease b Smoking cessation recommended No diabetes Denies anginal symptoms, cholesterol at goal  Chronic systolic (congestive) heart failure (HCC) Ejection fraction 25 to 30% echo 2023 Stressed the importance of medication compliance Unable to cut bisoprolol  recommend she increase up to 5 mg daily  Cardiomyopathy, ischemic Smoking cessation recommended Compliance  with her statin ICD in place, followed by EP  Centrilobular emphysema (HCC) no recent COPD exacerbation Smoking cessation recommended     Signed, Danissa Rundle, MD  06/06/2023 4:15 PM    [facility] [address], [place], [place] [address]

## 2023-06-06 ENCOUNTER — Encounter: Payer: Self-pay | Admitting: Cardiovascular Disease

## 2023-06-06 ENCOUNTER — Ambulatory Visit: Payer: Medicare HMO | Attending: Cardiovascular Disease | Admitting: Cardiovascular Disease

## 2023-06-06 VITALS — BP 118/56 | HR 81 | Ht 60.0 in | Wt 178.4 lb

## 2023-06-06 DIAGNOSIS — Z95810 Presence of automatic (implantable) cardiac defibrillator: Secondary | ICD-10-CM

## 2023-06-06 DIAGNOSIS — E785 Hyperlipidemia, unspecified: Secondary | ICD-10-CM | POA: Diagnosis not present

## 2023-06-06 DIAGNOSIS — I255 Ischemic cardiomyopathy: Secondary | ICD-10-CM | POA: Diagnosis not present

## 2023-06-06 DIAGNOSIS — I4719 Other supraventricular tachycardia: Secondary | ICD-10-CM | POA: Diagnosis not present

## 2023-06-06 DIAGNOSIS — J432 Centrilobular emphysema: Secondary | ICD-10-CM | POA: Diagnosis not present

## 2023-06-06 DIAGNOSIS — I25118 Atherosclerotic heart disease of native coronary artery with other forms of angina pectoris: Secondary | ICD-10-CM | POA: Diagnosis not present

## 2023-06-06 DIAGNOSIS — I5042 Chronic combined systolic (congestive) and diastolic (congestive) heart failure: Secondary | ICD-10-CM | POA: Diagnosis not present

## 2023-06-06 DIAGNOSIS — I10 Essential (primary) hypertension: Secondary | ICD-10-CM | POA: Diagnosis not present

## 2023-06-06 MED ORDER — BISOPROLOL FUMARATE 5 MG PO TABS: 5.0000 mg | ORAL_TABLET | Freq: Every day | ORAL | 3 refills | Status: DC

## 2023-06-06 NOTE — Patient Instructions (Addendum)
 Medication Instructions:  Please increase the bisoprolol  up to 5 mg daily  If you need a refill on your cardiac medications before your next appointment, please call your pharmacy.   Lab work: No new labs needed  Testing/Procedures: No new testing needed  Follow-Up: At [facility], you and your health needs are our priority.  As part of our continuing mission to provide you with exceptional heart care, we have created designated Provider Care Teams.  These Care Teams include your primary Cardiologist (physician) and Advanced Practice Providers (APPs -  Physician Assistants and Nurse Practitioners) who all work together to provide you with the care you need, when you need it.  You will need a follow up appointment in 6 months  Providers on your designated Care Team:   Lonni Meager, NP Bernardino Bring, PA-C Cadence [place], [place]  COVID-19 Vaccine Information can be found at: [email or web address] For questions related to vaccine distribution or appointments, please email [email or web address] or call [phone].

## 2023-07-19 ENCOUNTER — Ambulatory Visit (INDEPENDENT_AMBULATORY_CARE_PROVIDER_SITE_OTHER): Payer: Medicare HMO

## 2023-07-19 DIAGNOSIS — I255 Ischemic cardiomyopathy: Secondary | ICD-10-CM

## 2023-07-20 LAB — CUP PACEART REMOTE DEVICE CHECK
Battery Remaining Longevity: 33 mo
Battery Remaining Percentage: 62 %
Battery Voltage: 2.95 V
Brady Statistic AP VP Percent: 55 %
Brady Statistic AP VS Percent: 1 %
Brady Statistic AS VP Percent: 45 %
Brady Statistic AS VS Percent: 1 %
Brady Statistic RA Percent Paced: 55 %
Date Time Interrogation Session: 20250319020017
HighPow Impedance: 83 Ohm
HighPow Impedance: 83 Ohm
Implantable Lead Connection Status: 753985
Implantable Lead Connection Status: 753985
Implantable Lead Connection Status: 753985
Implantable Lead Implant Date: 20131024
Implantable Lead Implant Date: 20230606
Implantable Lead Implant Date: 20230606
Implantable Lead Location: 753859
Implantable Lead Location: 753860
Implantable Lead Location: 753862
Implantable Lead Model: 5071
Implantable Pulse Generator Implant Date: 20230606
Lead Channel Impedance Value: 290 Ohm
Lead Channel Impedance Value: 430 Ohm
Lead Channel Impedance Value: 730 Ohm
Lead Channel Pacing Threshold Amplitude: 0.625 V
Lead Channel Pacing Threshold Amplitude: 0.625 V
Lead Channel Pacing Threshold Amplitude: 2.5 V
Lead Channel Pacing Threshold Pulse Width: 0.5 ms
Lead Channel Pacing Threshold Pulse Width: 0.5 ms
Lead Channel Pacing Threshold Pulse Width: 1 ms
Lead Channel Sensing Intrinsic Amplitude: 12 mV
Lead Channel Sensing Intrinsic Amplitude: 5 mV
Lead Channel Setting Pacing Amplitude: 1.625
Lead Channel Setting Pacing Amplitude: 2 V
Lead Channel Setting Pacing Amplitude: 3 V
Lead Channel Setting Pacing Pulse Width: 0.5 ms
Lead Channel Setting Pacing Pulse Width: 1 ms
Lead Channel Setting Sensing Sensitivity: 0.5 mV
Pulse Gen Serial Number: 5538587
Zone Setting Status: 755011

## 2023-07-28 ENCOUNTER — Other Ambulatory Visit: Payer: Self-pay | Admitting: Internal Medicine

## 2023-07-28 DIAGNOSIS — E039 Hypothyroidism, unspecified: Secondary | ICD-10-CM

## 2023-07-28 DIAGNOSIS — I5022 Chronic systolic (congestive) heart failure: Secondary | ICD-10-CM

## 2023-07-31 NOTE — Telephone Encounter (Signed)
 Requested Prescriptions  Pending Prescriptions Disp Refills   ENTRESTO 24-26 MG [Pharmacy Med Name: ENTRESTO 24 MG-26 MG TABLET] 180 tablet 1    Sig: TAKE 1 TABLET BY MOUTH TWICE A DAY     Off-Protocol Failed - 07/31/2023  8:26 AM      Failed - Medication not assigned to a protocol, review manually.      Failed - Valid encounter within last 12 months    Recent Outpatient Visits   None     Future Appointments             In 1 week Judithann Graves, Nyoka Cowden, MD [facility] at [facility], [facility]             levothyroxine (SYNTHROID) 100 MCG tablet [Pharmacy Med Name: LEVOTHYROXINE 100 MCG TABLET] 90 tablet 0    Sig: TAKE 1 TABLET BY MOUTH DAILY BEFORE BREAKFAST.     Endocrinology:  Hypothyroid Agents Failed - 07/31/2023  8:26 AM      Failed - TSH in normal range and within 360 days    TSH  Date Value Ref Range Status  05/08/2023 9.360 (H) 0.450 - 4.500 uIU/mL Final         Failed - Valid encounter within last 12 months    Recent Outpatient Visits   None     Future Appointments             In 1 week Judithann Graves, Nyoka Cowden, MD [facility] Primary Care & Sports Medicine at [facility], [facility]

## 2023-08-07 ENCOUNTER — Ambulatory Visit (INDEPENDENT_AMBULATORY_CARE_PROVIDER_SITE_OTHER): Payer: Self-pay | Admitting: Internal Medicine

## 2023-08-07 ENCOUNTER — Encounter: Payer: Self-pay | Admitting: Internal Medicine

## 2023-08-07 VITALS — BP 108/72 | HR 63 | Ht 60.0 in | Wt 177.5 lb

## 2023-08-07 DIAGNOSIS — J432 Centrilobular emphysema: Secondary | ICD-10-CM

## 2023-08-07 DIAGNOSIS — E039 Hypothyroidism, unspecified: Secondary | ICD-10-CM

## 2023-08-07 DIAGNOSIS — Z95811 Presence of heart assist device: Secondary | ICD-10-CM

## 2023-08-07 DIAGNOSIS — J3089 Other allergic rhinitis: Secondary | ICD-10-CM | POA: Diagnosis not present

## 2023-08-07 DIAGNOSIS — L409 Psoriasis, unspecified: Secondary | ICD-10-CM

## 2023-08-07 DIAGNOSIS — N184 Chronic kidney disease, stage 4 (severe): Secondary | ICD-10-CM | POA: Diagnosis not present

## 2023-08-07 MED ORDER — ALBUTEROL SULFATE HFA 108 (90 BASE) MCG/ACT IN AERS: INHALATION_SPRAY | RESPIRATORY_TRACT | 3 refills | Status: AC

## 2023-08-07 NOTE — Progress Notes (Signed)
 Date:  08/07/2023   Name:  Dana [name]   DOB:  [birth year]   MRN:  [id number]   Chief Complaint: Hypothyroidism  Thyroid Problem Presents for follow-up visit. Patient reports no anxiety, constipation, diarrhea, fatigue or palpitations. The symptoms have been improving.  Rhinorrhea - recent symptoms with runny nose but no congestion, fever, HA for facial pain.  Not taking any otc medications. CKD - last BMP was improved.  She has no edema. Not seeing Nephrology yet. HFw/rEF - doing well on Jardiance, Entresto, spironolactone, bisoprolol and Imdur.  No chest pain.  Pacemaker in place with routine interrogation. Psoriasis - she does not want any treatment - it is not that bothersome to her.  Review of Systems  Constitutional:  Negative for chills, fatigue and fever.  HENT:  Positive for postnasal drip and rhinorrhea. Negative for congestion, sinus pressure and trouble swallowing.   Respiratory:  Positive for shortness of breath. Negative for cough, chest tightness and wheezing.   Cardiovascular:  Negative for chest pain and palpitations.  Gastrointestinal:  Positive for abdominal pain. Negative for constipation and diarrhea.  Musculoskeletal:  Positive for gait problem. Negative for arthralgias.  Skin:  Positive for rash.  Allergic/Immunologic: Positive for environmental allergies.  Neurological:  Negative for dizziness, light-headedness and headaches.  Psychiatric/Behavioral:  Negative for dysphoric mood and sleep disturbance. The patient is not nervous/anxious.      Lab Results  Component Value Date   NA 144 05/23/2023   K 4.5 05/23/2023   CO2 23 05/23/2023   GLUCOSE 95 05/23/2023   BUN 28 (H) 05/23/2023   CREATININE 1.21 (H) 05/23/2023   CALCIUM 9.8 05/23/2023   EGFR 46 (L) 05/23/2023   GFRNONAA 34 (L) 09/24/2021   Lab Results  Component Value Date   CHOL 148 10/18/2022   HDL 61 10/18/2022   LDLCALC 64 10/18/2022   LDLDIRECT 124 (H) 02/05/2021   TRIG 130 10/18/2022    CHOLHDL 2.4 10/18/2022   Lab Results  Component Value Date   TSH 9.360 (H) 05/08/2023   Lab Results  Component Value Date   HGBA1C 5.5 07/26/2018   Lab Results  Component Value Date   WBC 6.3 10/18/2022   HGB 12.9 10/18/2022   HCT 39.5 10/18/2022   MCV 99 (H) 10/18/2022   PLT 259 10/18/2022   Lab Results  Component Value Date   ALT 9 03/24/2023   AST 14 03/24/2023   ALKPHOS 97 03/24/2023   BILITOT 0.2 03/24/2023   Lab Results  Component Value Date   VD25OH 14.3 (L) 01/31/2023     Patient Active Problem List   Diagnosis Date Noted   Presence of heart assist device (HCC) 08/07/2023   Environmental and seasonal allergies 08/07/2023   Age-related osteoporosis without current pathological fracture 05/08/2023   Hypothyroidism (acquired) 05/08/2023   Secondary hyperparathyroidism of renal origin (HCC) 02/01/2023   Sensorineural hearing loss (SNHL) of both ears 01/31/2023   Aortic atherosclerosis (HCC) 11/15/2022   CKD (chronic kidney disease) stage 4, GFR 15-29 ml/min (HCC) 11/15/2022   Primary osteoarthritis of both knees 11/15/2022   Tinnitus of both ears 11/15/2022   Presence of biventricular AICD 11/15/2022   Ischemic cardiomyopathy 10/05/2021   Atrial tachycardia (HCC) 07/24/2021   Psoriasis 01/28/2021   NSTEMI (non-ST elevated myocardial infarction) [facility])    Coronary artery disease of native artery of native heart with stable angina pectoris (HCC) 09/09/2018   Centrilobular emphysema (HCC) 09/09/2018   Combined systolic and diastolic congestive heart failure (HCC) 07/25/2018  Mitral regurgitation 02/22/2012   Tobacco abuse 02/18/2012   Left bundle branch block 02/18/2012   Iron deficiency 02/18/2012    Allergies  Allergen Reactions   Bevespi Aerosphere [Glycopyrrolate-Formoterol] Hives and Rash    Past Surgical History:  Procedure Laterality Date   BIV ICD INSERTION CRT-D N/A 10/05/2021   Procedure: BIV ICD INSERTION CRT-D;  Surgeon: Lanier Prude, MD;  Location: [facility] INVASIVE CV LAB;  Service: Cardiovascular;  Laterality: N/A;   CARDIAC CATHETERIZATION  02/17/2012   CORONARY ARTERY BYPASS GRAFT  02/23/2012   Procedure: CORONARY ARTERY BYPASS GRAFTING (CABG);  Surgeon: Loreli Slot, MD;  Location: [facility] OR;  Service: Open Heart Surgery;  Laterality: N/A;  coronary artery bypass graft on pump times two utlizing left internal mammary artery and right greater saphenous vein harvested endoscopically, transesophageal echocardiogram    NO PAST SURGERIES     RIGHT/LEFT HEART CATH AND CORONARY/GRAFT ANGIOGRAPHY N/A 07/30/2018   Procedure: RIGHT/LEFT HEART CATH AND CORONARY/GRAFT ANGIOGRAPHY;  Surgeon: Iran Ouch, MD;  Location: [facility] INVASIVE CV LAB;  Service: Cardiovascular;  Laterality: N/A;   TEE WITHOUT CARDIOVERSION  02/22/2012   Procedure: TRANSESOPHAGEAL ECHOCARDIOGRAM (TEE);  Surgeon: Dolores Patty, MD;  Location: [facility] ENDOSCOPY;  Service: Cardiovascular;  Laterality: N/A;    Social History   Tobacco Use   Smoking status: Some Days    Current packs/day: 0.20    Average packs/day: 0.2 packs/day for 65.3 years (13.1 ttl pk-yrs)    Types: Cigarettes    Start date: 1960   Smokeless tobacco: Never   Tobacco comments:    Smokes one and half packs of cigarettes weekly    Vaping Use   Vaping status: Never Used  Substance Use Topics   Alcohol use: No   Drug use: No     Medication list has been reviewed and updated.  Current Meds  Medication Sig   acetaminophen (TYLENOL) 325 MG tablet Take 2 tablets (650 mg total) by mouth every 4 (four) hours as needed for headache or mild pain.   aspirin 81 MG chewable tablet Chew 1 tablet (81 mg total) by mouth daily.   atorvastatin (LIPITOR) 80 MG tablet TAKE 1 TABLET BY MOUTH EVERY DAY   bisoprolol (ZEBETA) 5 MG tablet Take 1 tablet (5 mg total) by mouth daily.   empagliflozin (JARDIANCE) 10 MG TABS tablet Take 1 tablet (10 mg total) by mouth daily.   ENTRESTO 24-26 MG TAKE 1  TABLET BY MOUTH TWICE A DAY   isosorbide mononitrate (IMDUR) 30 MG 24 hr tablet Take 1 tablet (30 mg total) by mouth daily.   levothyroxine (SYNTHROID) 100 MCG tablet TAKE 1 TABLET BY MOUTH DAILY BEFORE BREAKFAST.   spironolactone (ALDACTONE) 25 MG tablet Take 1 tablet (25 mg total) by mouth daily.   Tiotropium Bromide-Olodaterol (STIOLTO RESPIMAT) 2.5-2.5 MCG/ACT AERS Inhale 2 puffs into the lungs daily at 6 (six) AM.   [DISCONTINUED] albuterol (VENTOLIN HFA) 108 (90 Base) MCG/ACT inhaler TAKE 2 PUFFS BY MOUTH EVERY 6 HOURS AS NEEDED FOR WHEEZE OR SHORTNESS OF BREATH       08/07/2023    4:12 PM 05/08/2023    4:21 PM 01/31/2023    1:13 PM 11/15/2022    3:37 PM  GAD 7 : Generalized Anxiety Score  Nervous, Anxious, on Edge 0 0 0 0  Control/stop worrying 0 0 0 0  Worry too much - different things 0 0 0 0  Trouble relaxing 0 0 0 0  Restless 0 0 0 0  Easily annoyed or irritable 0 0 0 0  Afraid - awful might happen 0 0 0 0  Total GAD 7 Score 0 0 0 0  Anxiety Difficulty Not difficult at all Not difficult at all  Not difficult at all       08/07/2023    4:12 PM 05/08/2023    4:21 PM 01/31/2023    1:13 PM  Depression screen PHQ 2/9  Decreased Interest 0 0 0  Down, Depressed, Hopeless 0 0 0  PHQ - 2 Score 0 0 0  Altered sleeping 0 0 0  Tired, decreased energy 1 2 1   Change in appetite 0 2 1  Feeling bad or failure about yourself  0 0 0  Trouble concentrating 0 0 0  Moving slowly or fidgety/restless 0 0 0  Suicidal thoughts 0 0 0  PHQ-9 Score 1 4 2   Difficult doing work/chores Not difficult at all Not difficult at all Somewhat difficult    BP Readings from Last 3 Encounters:  08/07/23 108/72  06/06/23 (!) 118/56  05/23/23 (!) 122/50    Physical Exam Vitals and nursing note reviewed.  Constitutional:      General: She is not in acute distress.    Appearance: She is well-developed.  HENT:     Head: Normocephalic and atraumatic.  Cardiovascular:     Rate and Rhythm: Normal rate and  regular rhythm.  Pulmonary:     Effort: Pulmonary effort is normal. No respiratory distress.     Breath sounds: Decreased breath sounds present. No wheezing or rhonchi.  Musculoskeletal:     Cervical back: Normal range of motion.  Lymphadenopathy:     Cervical: No cervical adenopathy.  Skin:    General: Skin is warm and dry.     Findings: Rash present. Rash is scaling.     Comments: Over elbows, chest, abdomen and legs   Neurological:     Mental Status: She is alert and oriented to person, place, and time.  Psychiatric:        Attention and Perception: Attention normal.        Mood and Affect: Mood normal.        Behavior: Behavior normal.     Wt Readings from Last 3 Encounters:  08/07/23 177 lb 8 oz (80.5 kg)  06/06/23 178 lb 6 oz (80.9 kg)  05/23/23 180 lb (81.6 kg)    BP 108/72   Pulse 63   Ht 5' (1.524 m)   Wt 177 lb 8 oz (80.5 kg)   SpO2 96%   BMI 34.67 kg/m   Assessment and Plan:  Problem List Items Addressed This Visit       Unprioritized   Centrilobular emphysema (HCC) (Chronic)   Uses albuterol MDI as needed and Stiolto daily.       Relevant Medications   albuterol (VENTOLIN HFA) 108 (90 Base) MCG/ACT inhaler   Psoriasis   Continue Vaseline and topical moisturizers as needed      CKD (chronic kidney disease) stage 4, GFR 15-29 ml/min (HCC) (Chronic)   Relevant Orders   Basic metabolic panel with GFR   Hypothyroidism (acquired) - Primary (Chronic)   Now on levothyroxine 100 mcg. Will obtain labs and advise.      Relevant Orders   TSH+T4F+T3Free   Presence of heart assist device [facility])   Biventricular ICD interrogated every 3 months.      Environmental and seasonal allergies   Worsening symptoms due to spring pollen No evidence of infection  Recommend Allegra or Claritin as needed       Return in about 4 months (around 12/07/2023) for  CPX.    Reubin Milan, MD [facility] Primary Care and Sports Medicine [place]

## 2023-08-07 NOTE — Assessment & Plan Note (Signed)
 Continue Vaseline and topical moisturizers as needed

## 2023-08-07 NOTE — Assessment & Plan Note (Signed)
 Uses albuterol MDI as needed and Stiolto daily.

## 2023-08-07 NOTE — Assessment & Plan Note (Signed)
 Biventricular ICD interrogated every 3 months.

## 2023-08-07 NOTE — Assessment & Plan Note (Signed)
 Now on levothyroxine 100 mcg. Will obtain labs and advise.

## 2023-08-07 NOTE — Assessment & Plan Note (Signed)
 Worsening symptoms due to spring pollen No evidence of infection Recommend Allegra or Claritin as needed

## 2023-08-07 NOTE — Patient Instructions (Addendum)
 Fexofenadine 180 mg daily OR Loratadine 10 mg daily for allergy symptoms.  Schedule follow up with Cardiology in August.  Jardiance is not being prescribed for diabetes - it is for heart failure.  Your last blood sugar was normal.

## 2023-08-08 ENCOUNTER — Other Ambulatory Visit: Payer: Self-pay | Admitting: Internal Medicine

## 2023-08-08 DIAGNOSIS — E039 Hypothyroidism, unspecified: Secondary | ICD-10-CM

## 2023-08-08 LAB — BASIC METABOLIC PANEL WITH GFR
BUN/Creatinine Ratio: 14 (ref 12–28)
BUN: 22 mg/dL (ref 8–27)
CO2: 23 mmol/L (ref 20–29)
Calcium: 10 mg/dL (ref 8.7–10.3)
Chloride: 100 mmol/L (ref 96–106)
Creatinine, Ser: 1.53 mg/dL — ABNORMAL HIGH (ref 0.57–1.00)
Glucose: 96 mg/dL (ref 70–99)
Potassium: 5.1 mmol/L (ref 3.5–5.2)
Sodium: 141 mmol/L (ref 134–144)
eGFR: 34 mL/min/{1.73_m2} — ABNORMAL LOW (ref >59)

## 2023-08-08 LAB — TSH+T4F+T3FREE
Free T4: 0.95 ng/dL (ref 0.82–1.77)
T3, Free: 2.6 pg/mL (ref 2.0–4.4)
TSH: 14.1 u[IU]/mL — ABNORMAL HIGH (ref 0.450–4.500)

## 2023-08-09 DIAGNOSIS — H524 Presbyopia: Secondary | ICD-10-CM | POA: Diagnosis not present

## 2023-08-09 NOTE — Telephone Encounter (Signed)
 Refilled 07/31/23 # 90. Requested Prescriptions  Refused Prescriptions Disp Refills   levothyroxine (SYNTHROID) 100 MCG tablet [Pharmacy Med Name: LEVOTHYROXINE 100 MCG TABLET] 90 tablet 0    Sig: TAKE 1 TABLET BY MOUTH DAILY BEFORE BREAKFAST.     Endocrinology:  Hypothyroid Agents Failed - 08/09/2023 11:37 AM      Failed - TSH in normal range and within 360 days    TSH  Date Value Ref Range Status  08/07/2023 14.100 (H) 0.450 - 4.500 uIU/mL Final         Passed - Valid encounter within last 12 months    Recent Outpatient Visits           2 days ago Hypothyroidism (acquired)   [facility] Primary Care & [facility] Medicine at [facility], Nyoka Cowden, MD       Future Appointments             In 4 months Judithann Graves, Nyoka Cowden, MD [facility] Primary Care & Sports Medicine at [facility], [facility]

## 2023-09-01 NOTE — Addendum Note (Signed)
 Addended by: Lott Rouleau A on: 09/01/2023 11:26 AM   Modules accepted: Orders

## 2023-09-01 NOTE — Progress Notes (Signed)
 Remote ICD transmission.

## 2023-09-11 ENCOUNTER — Other Ambulatory Visit: Payer: Self-pay | Admitting: Internal Medicine

## 2023-09-11 DIAGNOSIS — E039 Hypothyroidism, unspecified: Secondary | ICD-10-CM

## 2023-09-13 NOTE — Telephone Encounter (Signed)
 Too soon for refill, refilled 07/31/23 for 90 days.  Requested Prescriptions  Pending Prescriptions Disp Refills   levothyroxine  (SYNTHROID ) 100 MCG tablet [Pharmacy Med Name: LEVOTHYROXINE  100 MCG TABLET] 90 tablet 0    Sig: TAKE 1 TABLET BY MOUTH DAILY BEFORE BREAKFAST.     Endocrinology:  Hypothyroid Agents Failed - 09/13/2023  9:33 AM      Failed - TSH in normal range and within 360 days    TSH  Date Value Ref Range Status  08/07/2023 14.100 (H) 0.450 - 4.500 uIU/mL Final         Passed - Valid encounter within last 12 months    Recent Outpatient Visits           1 month ago Hypothyroidism (acquired)   [facility] Medicine at [facility], Chales [place], MD       Future Appointments             In 3 months Gala Jubilee, Chales [place], MD [facility] Primary Care & [facility] Medicine at [facility], [facility]

## 2023-09-27 ENCOUNTER — Other Ambulatory Visit: Payer: Self-pay | Admitting: Cardiovascular Disease

## 2023-10-18 ENCOUNTER — Ambulatory Visit (INDEPENDENT_AMBULATORY_CARE_PROVIDER_SITE_OTHER): Payer: Medicare HMO

## 2023-10-18 ENCOUNTER — Ambulatory Visit: Payer: Self-pay | Admitting: Cardiology

## 2023-10-18 DIAGNOSIS — I255 Ischemic cardiomyopathy: Secondary | ICD-10-CM | POA: Diagnosis not present

## 2023-10-18 LAB — CUP PACEART REMOTE DEVICE CHECK
Battery Remaining Longevity: 31 mo
Battery Remaining Percentage: 57 %
Battery Voltage: 2.95 V
Brady Statistic AP VP Percent: 47 %
Brady Statistic AP VS Percent: 1 %
Brady Statistic AS VP Percent: 53 %
Brady Statistic AS VS Percent: 1 %
Brady Statistic RA Percent Paced: 47 %
Date Time Interrogation Session: 20250618031210
HighPow Impedance: 82 Ohm
HighPow Impedance: 82 Ohm
Implantable Lead Connection Status: 753985
Implantable Lead Connection Status: 753985
Implantable Lead Connection Status: 753985
Implantable Lead Implant Date: 20131024
Implantable Lead Implant Date: 20230606
Implantable Lead Implant Date: 20230606
Implantable Lead Location: 753859
Implantable Lead Location: 753860
Implantable Lead Location: 753862
Implantable Lead Model: 5071
Implantable Pulse Generator Implant Date: 20230606
Lead Channel Impedance Value: 300 Ohm
Lead Channel Impedance Value: 400 Ohm
Lead Channel Impedance Value: 700 Ohm
Lead Channel Pacing Threshold Amplitude: 0.625 V
Lead Channel Pacing Threshold Amplitude: 0.75 V
Lead Channel Pacing Threshold Amplitude: 2.5 V
Lead Channel Pacing Threshold Pulse Width: 0.5 ms
Lead Channel Pacing Threshold Pulse Width: 0.5 ms
Lead Channel Pacing Threshold Pulse Width: 1 ms
Lead Channel Sensing Intrinsic Amplitude: 12 mV
Lead Channel Sensing Intrinsic Amplitude: 2.2 mV
Lead Channel Setting Pacing Amplitude: 1.75 V
Lead Channel Setting Pacing Amplitude: 2 V
Lead Channel Setting Pacing Amplitude: 3 V
Lead Channel Setting Pacing Pulse Width: 0.5 ms
Lead Channel Setting Pacing Pulse Width: 1 ms
Lead Channel Setting Sensing Sensitivity: 0.5 mV
Pulse Gen Serial Number: 5538587
Zone Setting Status: 755011

## 2023-11-08 ENCOUNTER — Other Ambulatory Visit: Payer: Self-pay | Admitting: Internal Medicine

## 2023-11-08 DIAGNOSIS — E039 Hypothyroidism, unspecified: Secondary | ICD-10-CM

## 2023-11-10 NOTE — Telephone Encounter (Signed)
 TSH in date.  Requested Prescriptions  Pending Prescriptions Disp Refills   levothyroxine  (SYNTHROID ) 100 MCG tablet [Pharmacy Med Name: LEVOTHYROXINE  100 MCG TABLET] 90 tablet 0    Sig: TAKE 1 TABLET BY MOUTH DAILY BEFORE BREAKFAST.     Endocrinology:  Hypothyroid Agents Failed - 11/10/2023  1:57 PM      Failed - TSH in normal range and within 360 days    TSH  Date Value Ref Range Status  08/07/2023 14.100 (H) 0.450 - 4.500 uIU/mL Final         Passed - Valid encounter within last 12 months    Recent Outpatient Visits           3 months ago Hypothyroidism (acquired)   [facility] Primary Care & Sports Medicine at [facility], Leita DEL, MD       Future Appointments             In 1 month Justus, Leita DEL, MD [facility] Primary Care & Sports Medicine at [facility], [facility]

## 2023-12-08 ENCOUNTER — Other Ambulatory Visit: Payer: Self-pay | Admitting: Internal Medicine

## 2023-12-08 DIAGNOSIS — I5022 Chronic systolic (congestive) heart failure: Secondary | ICD-10-CM

## 2023-12-12 ENCOUNTER — Encounter: Payer: Self-pay | Admitting: Internal Medicine

## 2023-12-12 ENCOUNTER — Ambulatory Visit (INDEPENDENT_AMBULATORY_CARE_PROVIDER_SITE_OTHER): Admitting: Internal Medicine

## 2023-12-12 VITALS — BP 112/66 | HR 72 | Ht 60.0 in | Wt 177.0 lb

## 2023-12-12 DIAGNOSIS — I5042 Chronic combined systolic (congestive) and diastolic (congestive) heart failure: Secondary | ICD-10-CM

## 2023-12-12 DIAGNOSIS — M810 Age-related osteoporosis without current pathological fracture: Secondary | ICD-10-CM

## 2023-12-12 DIAGNOSIS — Z0000 Encounter for general adult medical examination without abnormal findings: Secondary | ICD-10-CM

## 2023-12-12 DIAGNOSIS — I25118 Atherosclerotic heart disease of native coronary artery with other forms of angina pectoris: Secondary | ICD-10-CM | POA: Diagnosis not present

## 2023-12-12 DIAGNOSIS — L409 Psoriasis, unspecified: Secondary | ICD-10-CM | POA: Diagnosis not present

## 2023-12-12 DIAGNOSIS — J432 Centrilobular emphysema: Secondary | ICD-10-CM

## 2023-12-12 DIAGNOSIS — E559 Vitamin D deficiency, unspecified: Secondary | ICD-10-CM | POA: Insufficient documentation

## 2023-12-12 DIAGNOSIS — N184 Chronic kidney disease, stage 4 (severe): Secondary | ICD-10-CM | POA: Diagnosis not present

## 2023-12-12 DIAGNOSIS — E039 Hypothyroidism, unspecified: Secondary | ICD-10-CM | POA: Diagnosis not present

## 2023-12-12 MED ORDER — STIOLTO RESPIMAT 2.5-2.5 MCG/ACT IN AERS: 2.0000 | INHALATION_SPRAY | Freq: Every day | RESPIRATORY_TRACT | 5 refills | Status: AC

## 2023-12-12 NOTE — Assessment & Plan Note (Addendum)
 On beta blocker, Jardiance , ASA and statin.

## 2023-12-12 NOTE — Telephone Encounter (Signed)
 Requested medication (s) are due for refill today:   Yes  Requested medication (s) are on the active medication list:   Yes  Future visit scheduled:   Yes  Today at 4:00 (8/12) with Dr. Justus   Last ordered: 07/31/2023 #180, 1 refill  No protocol assigned to this.   Has appt today.      Requested Prescriptions  Pending Prescriptions Disp Refills   ENTRESTO  24-26 MG [Pharmacy Med Name: ENTRESTO  24 MG-26 MG TABLET] 180 tablet 1    Sig: TAKE 1 TABLET BY MOUTH TWICE A DAY     Off-Protocol Failed - 12/12/2023 12:16 PM      Failed - Medication not assigned to a protocol, review manually.      Passed - Valid encounter within last 12 months    Recent Outpatient Visits           4 months ago Hypothyroidism (acquired)   [place] [facility] at [facility], Leita DEL, MD       Future Appointments             Today Justus Leita DEL, MD [facility] Primary Care & Sports Medicine at [facility], [facility]

## 2023-12-12 NOTE — Assessment & Plan Note (Addendum)
 On calcium  and Vitamin D ; last vitamin D  was low DEXA done 2024

## 2023-12-12 NOTE — Assessment & Plan Note (Signed)
 Baseline shortness of breath stable on Stiolto daily and prn albuterol . Will refill Stiolto. Prevnar 20 up to date.

## 2023-12-12 NOTE — Assessment & Plan Note (Signed)
 GFR has been stable in the 30's without evidence of volume overload.

## 2023-12-12 NOTE — Assessment & Plan Note (Signed)
 She has extensive plaque over a large percentage of her body but manages with lotion or Vaseline. She continues to decline referral for more aggressive treatment.

## 2023-12-12 NOTE — Progress Notes (Signed)
 Date:  12/12/2023   Name:  Dana [name]   DOB:  04/29/1946   MRN:  [id number]   Chief Complaint: Annual Exam Dana [name] is a 78 y.o. female who presents today for her Complete Annual Exam. She feels fairly well. She reports exercising- none. She reports she is sleeping fairly well. Breast complaints - none.  Health Maintenance  Topic Date Due   Zoster (Shingles) Vaccine (1 of 2) 08/06/1962   COVID-19 Vaccine (3 - Mixed Product risk series) 02/28/2023   Flu Shot  12/01/2023   Medicare Annual Wellness Visit  12/21/2023   Pneumococcal Vaccine for age over 72  Completed   DEXA scan (bone density measurement)  Completed   Hepatitis B Vaccine  Aged Out   HPV Vaccine  Aged Out   Meningitis B Vaccine  Aged Out   DTaP/Tdap/Td vaccine  Discontinued   Hepatitis C Screening  Discontinued    Congestive Heart Failure Presents for follow-up (followed by Cardiology) visit. Associated symptoms include shortness of breath. Pertinent negatives include no abdominal pain, chest pain, fatigue, palpitations or unexpected weight change. The symptoms have been stable.  Thyroid  Problem Presents for follow-up visit. Patient reports no anxiety, constipation, diarrhea, fatigue or palpitations. The symptoms have been stable.  Rash This is a chronic (plaque psoriasis) problem. The problem is unchanged. The rash is diffuse. Associated symptoms include shortness of breath. Pertinent negatives include no cough, diarrhea or fatigue.  OP - DEXA last year.    COPD - stable shortness of breath, on Stiolto daily and prn albuterol .  Review of Systems  Constitutional:  Negative for chills, fatigue and unexpected weight change.  HENT:  Negative for trouble swallowing.   Eyes:  Negative for visual disturbance.  Respiratory:  Positive for shortness of breath. Negative for cough, chest tightness and wheezing.   Cardiovascular:  Negative for chest pain, palpitations and leg swelling.  Gastrointestinal:   Negative for abdominal pain, constipation and diarrhea.  Genitourinary:  Negative for dysuria, frequency, hematuria and urgency.  Musculoskeletal:  Negative for arthralgias and myalgias.  Skin:  Positive for rash.  Neurological:  Negative for dizziness, weakness, light-headedness and headaches.  Psychiatric/Behavioral:  Negative for dysphoric mood and sleep disturbance. The patient is not nervous/anxious.      Lab Results  Component Value Date   NA 141 08/07/2023   K 5.1 08/07/2023   CO2 23 08/07/2023   GLUCOSE 96 08/07/2023   BUN 22 08/07/2023   CREATININE 1.53 (H) 08/07/2023   CALCIUM  10.0 08/07/2023   EGFR 34 (L) 08/07/2023   GFRNONAA 34 (L) 09/24/2021   Lab Results  Component Value Date   CHOL 148 10/18/2022   HDL 61 10/18/2022   LDLCALC 64 10/18/2022   LDLDIRECT 124 (H) 02/05/2021   TRIG 130 10/18/2022   CHOLHDL 2.4 10/18/2022   Lab Results  Component Value Date   TSH 14.100 (H) 08/07/2023   Lab Results  Component Value Date   HGBA1C 5.5 07/26/2018   Lab Results  Component Value Date   WBC 6.3 10/18/2022   HGB 12.9 10/18/2022   HCT 39.5 10/18/2022   MCV 99 (H) 10/18/2022   PLT 259 10/18/2022   Lab Results  Component Value Date   ALT 9 03/24/2023   AST 14 03/24/2023   ALKPHOS 97 03/24/2023   BILITOT 0.2 03/24/2023   Lab Results  Component Value Date   VD25OH 14.3 (L) 01/31/2023     Patient Active Problem List   Diagnosis Date Noted  Vitamin D  deficiency disease 12/12/2023   Presence of heart assist device (HCC) 08/07/2023   Environmental and seasonal allergies 08/07/2023   Age-related osteoporosis without current pathological fracture 05/08/2023   Hypothyroidism (acquired) 05/08/2023   Secondary hyperparathyroidism of renal origin (HCC) 02/01/2023   Sensorineural hearing loss (SNHL) of both ears 01/31/2023   Aortic atherosclerosis (HCC) 11/15/2022   CKD (chronic kidney disease) stage 4, GFR 15-29 ml/min (HCC) 11/15/2022   Primary osteoarthritis  of both knees 11/15/2022   Tinnitus of both ears 11/15/2022   Presence of biventricular AICD 11/15/2022   Ischemic cardiomyopathy 10/05/2021   Atrial tachycardia (HCC) 07/24/2021   Psoriasis 01/28/2021   Coronary artery disease of native artery of native heart with stable angina pectoris (HCC) 09/09/2018   Centrilobular emphysema (HCC) 09/09/2018   Combined systolic and diastolic congestive heart failure (HCC) 07/25/2018   Mitral regurgitation 02/22/2012   Tobacco abuse 02/18/2012   Left bundle branch block 02/18/2012   Iron deficiency 02/18/2012    Allergies  Allergen Reactions   Bevespi  Aerosphere [Glycopyrrolate -Formoterol] Hives and Rash    Past Surgical History:  Procedure Laterality Date   BIV ICD INSERTION CRT-D N/A 10/05/2021   Procedure: BIV ICD INSERTION CRT-D;  Surgeon: Cindie Ole DASEN, MD;  Location: [facility] INVASIVE CV LAB;  Service: Cardiovascular;  Laterality: N/A;   CARDIAC CATHETERIZATION  02/17/2012   CORONARY ARTERY BYPASS GRAFT  02/23/2012   Procedure: CORONARY ARTERY BYPASS GRAFTING (CABG);  Surgeon: Elspeth JAYSON Millers, MD;  Location: [facility] OR;  Service: Open Heart Surgery;  Laterality: N/A;  coronary artery bypass graft on pump times two utlizing left internal mammary artery and right greater saphenous vein harvested endoscopically, transesophageal echocardiogram    NO PAST SURGERIES     RIGHT/LEFT HEART CATH AND CORONARY/GRAFT ANGIOGRAPHY N/A 07/30/2018   Procedure: RIGHT/LEFT HEART CATH AND CORONARY/GRAFT ANGIOGRAPHY;  Surgeon: Darron Deatrice LABOR, MD;  Location: [facility] INVASIVE CV LAB;  Service: Cardiovascular;  Laterality: N/A;   TEE WITHOUT CARDIOVERSION  02/22/2012   Procedure: TRANSESOPHAGEAL ECHOCARDIOGRAM (TEE);  Surgeon: Toribio JONELLE Fuel, MD;  Location: [facility] ENDOSCOPY;  Service: Cardiovascular;  Laterality: N/A;    Social History   Tobacco Use   Smoking status: Some Days    Current packs/day: 0.20    Average packs/day: 0.2 packs/day for 65.6 years (13.1  ttl pk-yrs)    Types: Cigarettes    Start date: 1960   Smokeless tobacco: Never   Tobacco comments:    Smokes one and half packs of cigarettes weekly    Vaping Use   Vaping status: Never Used  Substance Use Topics   Alcohol use: No   Drug use: No     Medication list has been reviewed and updated.  Current Meds  Medication Sig   acetaminophen  (TYLENOL ) 325 MG tablet Take 2 tablets (650 mg total) by mouth every 4 (four) hours as needed for headache or mild pain.   albuterol  (VENTOLIN  HFA) 108 (90 Base) MCG/ACT inhaler TAKE 2 PUFFS BY MOUTH EVERY 6 HOURS AS NEEDED FOR WHEEZE OR SHORTNESS OF BREATH   aspirin  81 MG chewable tablet Chew 1 tablet (81 mg total) by mouth daily.   atorvastatin  (LIPITOR ) 80 MG tablet TAKE 1 TABLET BY MOUTH EVERY DAY   bisoprolol  (ZEBETA ) 5 MG tablet Take 1 tablet (5 mg total) by mouth daily.   empagliflozin  (JARDIANCE ) 10 MG TABS tablet TAKE 1 TABLET BY MOUTH EVERY DAY   ENTRESTO  24-26 MG TAKE 1 TABLET BY MOUTH TWICE A DAY   isosorbide  mononitrate (  IMDUR ) 30 MG 24 hr tablet Take 1 tablet (30 mg total) by mouth daily.   levothyroxine  (SYNTHROID ) 100 MCG tablet TAKE 1 TABLET BY MOUTH DAILY BEFORE BREAKFAST.   metoprolol  succinate (TOPROL -XL) 25 MG 24 hr tablet Take 12.5 mg by mouth daily.   spironolactone  (ALDACTONE ) 25 MG tablet Take 1 tablet (25 mg total) by mouth daily.   torsemide  (DEMADEX ) 20 MG tablet Take 1 tablet (20 mg total) by mouth 4 (four) times a week. Take Monday, Wednesday, Friday, Saturday.   [DISCONTINUED] Tiotropium Bromide -Olodaterol (STIOLTO RESPIMAT ) 2.5-2.5 MCG/ACT AERS Inhale 2 puffs into the lungs daily at 6 (six) AM.       12/12/2023    4:08 PM 08/07/2023    4:12 PM 05/08/2023    4:21 PM 01/31/2023    1:13 PM  GAD 7 : Generalized Anxiety Score  Nervous, Anxious, on Edge 0 0 0 0  Control/stop worrying 0 0 0 0  Worry too much - different things 0 0 0 0  Trouble relaxing 0 0 0 0  Restless 0 0 0 0  Easily annoyed or irritable 0 0 0 0   Afraid - awful might happen 0 0 0 0  Total GAD 7 Score 0 0 0 0  Anxiety Difficulty Not difficult at all Not difficult at all Not difficult at all        12/12/2023    4:08 PM 08/07/2023    4:12 PM 05/08/2023    4:21 PM  Depression screen PHQ 2/9  Decreased Interest 0 0 0  Down, Depressed, Hopeless 0 0 0  PHQ - 2 Score 0 0 0  Altered sleeping 1 0 0  Tired, decreased energy 0 1 2  Change in appetite 0 0 2  Feeling bad or failure about yourself  0 0 0  Trouble concentrating 0 0 0  Moving slowly or fidgety/restless 0 0 0  Suicidal thoughts 0 0 0  PHQ-9 Score 1 1 4   Difficult doing work/chores Not difficult at all Not difficult at all Not difficult at all    BP Readings from Last 3 Encounters:  12/12/23 112/66  08/07/23 108/72  06/06/23 (!) 118/56    Physical Exam Vitals and nursing note reviewed.  Constitutional:      General: She is not in acute distress.    Appearance: Normal appearance. She is well-developed.  HENT:     Head: Normocephalic and atraumatic.     Right Ear: Tympanic membrane and ear canal normal.     Left Ear: Tympanic membrane and ear canal normal.     Nose:     Right Sinus: No maxillary sinus tenderness.     Left Sinus: No maxillary sinus tenderness.  Eyes:     General: No scleral icterus.       Right eye: No discharge.        Left eye: No discharge.     Conjunctiva/sclera: Conjunctivae normal.  Neck:     Thyroid : No thyromegaly.     Vascular: No carotid bruit.  Cardiovascular:     Rate and Rhythm: Normal rate and regular rhythm.     Pulses: Normal pulses.     Heart sounds: Normal heart sounds. No murmur heard. Pulmonary:     Effort: Pulmonary effort is normal. No respiratory distress.     Breath sounds: No wheezing.  Abdominal:     General: Bowel sounds are normal.     Palpations: Abdomen is soft. There is no mass.  Tenderness: There is no abdominal tenderness. There is no guarding or rebound.  Musculoskeletal:     Cervical back: Normal  range of motion. No erythema.     Right lower leg: No edema.     Left lower leg: No edema.  Lymphadenopathy:     Cervical: No cervical adenopathy.  Skin:    General: Skin is warm and dry.     Findings: Rash (extensive psoriatic lesions on chest, abdomen, back, arms and legs.) present.  Neurological:     Mental Status: She is alert and oriented to person, place, and time.     Cranial Nerves: No cranial nerve deficit.     Sensory: No sensory deficit.     Deep Tendon Reflexes: Reflexes are normal and symmetric.  Psychiatric:        Attention and Perception: Attention normal.        Mood and Affect: Mood normal.        Behavior: Behavior normal.     Wt Readings from Last 3 Encounters:  12/12/23 177 lb (80.3 kg)  08/07/23 177 lb 8 oz (80.5 kg)  06/06/23 178 lb 6 oz (80.9 kg)    BP 112/66   Pulse 72   Ht 5' (1.524 m)   Wt 177 lb (80.3 kg)   SpO2 91%   BMI 34.57 kg/m   Assessment and Plan:  Problem List Items Addressed This Visit       Unprioritized   Combined systolic and diastolic congestive heart failure (HCC) (Chronic)   Compensated on exam without edema or rales Baseline shortness of breath with exertion only. Maintained on Entresto , metoprolol , bisoprolol , Demadex  and spironolactone . Followed by Dr. Gollan      Relevant Medications   metoprolol  succinate (TOPROL -XL) 25 MG 24 hr tablet   Coronary artery disease of native artery of native heart with stable angina pectoris (HCC) (Chronic)   On beta blocker, Jardiance , ASA and statin.      Relevant Medications   metoprolol  succinate (TOPROL -XL) 25 MG 24 hr tablet   Other Relevant Orders   Comprehensive metabolic panel with GFR   Lipid panel   Centrilobular emphysema (HCC) (Chronic)   Baseline shortness of breath stable on Stiolto daily and prn albuterol . Will refill Stiolto. Prevnar 20 up to date.      Relevant Medications   Tiotropium Bromide -Olodaterol (STIOLTO RESPIMAT ) 2.5-2.5 MCG/ACT AERS   Other  Relevant Orders   CBC with Differential/Platelet   Psoriasis (Chronic)   She has extensive plaque over a large percentage of her body but manages with lotion or Vaseline. She continues to decline referral for more aggressive treatment.      CKD (chronic kidney disease) stage 4, GFR 15-29 ml/min (HCC) (Chronic)   GFR has been stable in the 30's without evidence of volume overload.      Relevant Orders   Comprehensive metabolic panel with GFR   Age-related osteoporosis without current pathological fracture (Chronic)   On calcium  and Vitamin D ; last vitamin D  was low DEXA done 2024      Hypothyroidism (acquired) (Chronic)   Supplemented with Levothyroxine . Will check labs and advise if dose change is needed      Relevant Medications   metoprolol  succinate (TOPROL -XL) 25 MG 24 hr tablet   Other Relevant Orders   TSH + free T4   Vitamin D  deficiency disease (Chronic)   Low levels in 2024 - now on daily supplement      Relevant Orders   VITAMIN D  25 Hydroxy (  Vit-D Deficiency, Fractures)   Other Visit Diagnoses       Annual physical exam    -  Primary   she has aged out of mammograms and colonoscopy she declines Shingrix - had Zostavax 2023 recommend annual flu vaccine       No follow-ups on file.    Leita HILARIO Adie, MD [facility] [place]

## 2023-12-12 NOTE — Assessment & Plan Note (Signed)
 Compensated on exam without edema or rales Baseline shortness of breath with exertion only. Maintained on Entresto , metoprolol , bisoprolol , Demadex  and spironolactone . Followed by Dr. Gollan

## 2023-12-12 NOTE — Assessment & Plan Note (Signed)
 Supplemented with Levothyroxine . Will check labs and advise if dose change is needed

## 2023-12-12 NOTE — Assessment & Plan Note (Signed)
 Low levels in 2024 - now on daily supplement

## 2023-12-13 ENCOUNTER — Other Ambulatory Visit: Payer: Self-pay | Admitting: Internal Medicine

## 2023-12-13 ENCOUNTER — Ambulatory Visit: Payer: Self-pay | Admitting: Internal Medicine

## 2023-12-13 DIAGNOSIS — E039 Hypothyroidism, unspecified: Secondary | ICD-10-CM

## 2023-12-13 LAB — CBC WITH DIFFERENTIAL/PLATELET
Basophils Absolute: 0.1 x10E3/uL (ref 0.0–0.2)
Basos: 1 %
EOS (ABSOLUTE): 0.2 x10E3/uL (ref 0.0–0.4)
Eos: 2 %
Hematocrit: 41.1 % (ref 34.0–46.6)
Hemoglobin: 13 g/dL (ref 11.1–15.9)
Immature Grans (Abs): 0 x10E3/uL (ref 0.0–0.1)
Immature Granulocytes: 0 %
Lymphocytes Absolute: 1.1 x10E3/uL (ref 0.7–3.1)
Lymphs: 12 %
MCH: 29.5 pg (ref 26.6–33.0)
MCHC: 31.6 g/dL (ref 31.5–35.7)
MCV: 93 fL (ref 79–97)
Monocytes Absolute: 0.8 x10E3/uL (ref 0.1–0.9)
Monocytes: 9 %
Neutrophils Absolute: 6.9 x10E3/uL (ref 1.4–7.0)
Neutrophils: 76 %
Platelets: 336 x10E3/uL (ref 150–450)
RBC: 4.41 x10E6/uL (ref 3.77–5.28)
RDW: 13.3 % (ref 11.7–15.4)
WBC: 9.1 x10E3/uL (ref 3.4–10.8)

## 2023-12-13 LAB — COMPREHENSIVE METABOLIC PANEL WITH GFR
ALT: 6 IU/L (ref 0–32)
AST: 11 IU/L (ref 0–40)
Albumin: 4.3 g/dL (ref 3.8–4.8)
Alkaline Phosphatase: 107 IU/L (ref 44–121)
BUN/Creatinine Ratio: 18 (ref 12–28)
BUN: 24 mg/dL (ref 8–27)
Bilirubin Total: 0.3 mg/dL (ref 0.0–1.2)
CO2: 23 mmol/L (ref 20–29)
Calcium: 10.2 mg/dL (ref 8.7–10.3)
Chloride: 101 mmol/L (ref 96–106)
Creatinine, Ser: 1.35 mg/dL — ABNORMAL HIGH (ref 0.57–1.00)
Globulin, Total: 2.7 g/dL (ref 1.5–4.5)
Glucose: 90 mg/dL (ref 70–99)
Potassium: 5.4 mmol/L — ABNORMAL HIGH (ref 3.5–5.2)
Sodium: 140 mmol/L (ref 134–144)
Total Protein: 7 g/dL (ref 6.0–8.5)
eGFR: 40 mL/min/1.73 — ABNORMAL LOW (ref >59)

## 2023-12-13 LAB — TSH+FREE T4
Free T4: 1.1 ng/dL (ref 0.82–1.77)
TSH: 7.35 u[IU]/mL — ABNORMAL HIGH (ref 0.450–4.500)

## 2023-12-13 LAB — LIPID PANEL
Chol/HDL Ratio: 4.4 ratio (ref 0.0–4.4)
Cholesterol, Total: 217 mg/dL — ABNORMAL HIGH (ref 100–199)
HDL: 49 mg/dL (ref >39)
LDL Chol Calc (NIH): 124 mg/dL — ABNORMAL HIGH (ref 0–99)
Triglycerides: 253 mg/dL — ABNORMAL HIGH (ref 0–149)
VLDL Cholesterol Cal: 44 mg/dL — ABNORMAL HIGH (ref 5–40)

## 2023-12-13 LAB — VITAMIN D 25 HYDROXY (VIT D DEFICIENCY, FRACTURES): Vit D, 25-Hydroxy: 29.5 ng/mL — ABNORMAL LOW (ref 30.0–100.0)

## 2023-12-13 LAB — SPECIMEN STATUS REPORT

## 2023-12-15 NOTE — Telephone Encounter (Signed)
 Requested by interface surescripts. Last OV 12/12/23.  Requested Prescriptions  Pending Prescriptions Disp Refills   levothyroxine  (SYNTHROID ) 100 MCG tablet [Pharmacy Med Name: LEVOTHYROXINE  100 MCG TABLET] 90 tablet 0    Sig: TAKE 1 TABLET BY MOUTH DAILY BEFORE BREAKFAST.     Endocrinology:  Hypothyroid Agents Failed - 12/15/2023  4:06 PM      Failed - TSH in normal range and within 360 days    TSH  Date Value Ref Range Status  12/12/2023 7.350 (H) 0.450 - 4.500 uIU/mL Final         Passed - Valid encounter within last 12 months    Recent Outpatient Visits           3 days ago Annual physical exam   [facility] at [facility], Leita DEL, MD   4 months ago Hypothyroidism (acquired)   [facility] at [facility], Leita DEL, MD

## 2023-12-27 ENCOUNTER — Ambulatory Visit: Payer: Medicare HMO | Admitting: Emergency Medicine

## 2023-12-27 VITALS — Ht 60.0 in | Wt 174.0 lb

## 2023-12-27 DIAGNOSIS — Z0000 Encounter for general adult medical examination without abnormal findings: Secondary | ICD-10-CM

## 2023-12-27 NOTE — Patient Instructions (Signed)
 Ms. Akerley , Thank you for taking time out of your busy schedule to complete your Annual Wellness Visit with me. I enjoyed our conversation and look forward to speaking with you again next year. I, as well as your care team,  appreciate your ongoing commitment to your health goals. Please review the following plan we discussed and let me know if I can assist you in the future. Your Game plan/ To Do List    Referrals: None   Follow up Visits: We will see or speak with you next year for your Next Medicare AWV with our clinical staff Have you seen your provider in the last 6 months (3 months if uncontrolled diabetes)? Yes  Clinician Recommendations: Get the flu shot at your convenience.  Aim for 30 minutes of exercise or brisk walking, 6-8 glasses of water, and 5 servings of fruits and vegetables each day.       This is a list of the screenings recommended for you:  Health Maintenance  Topic Date Due   Zoster (Shingles) Vaccine (1 of 2) 08/06/1962   COVID-19 Vaccine (3 - Mixed Product risk series) 02/28/2023   Flu Shot  12/01/2023   Medicare Annual Wellness Visit  12/26/2024   DEXA scan (bone density measurement)  02/13/2025   Pneumococcal Vaccine for age over 101  Completed   HPV Vaccine  Aged Out   Meningitis B Vaccine  Aged Out   DTaP/Tdap/Td vaccine  Discontinued   Hepatitis C Screening  Discontinued    Advanced directives: (Provided) Advance directive discussed with you today. I have provided a copy for you to complete at home and have notarized. Once this is complete, please bring a copy in to our office so we can scan it into your chart.  Advance Care Planning is important because it:  [x]  Makes sure you receive the medical care that is consistent with your values, goals, and preferences  [x]  It provides guidance to your family and loved ones and reduces their decisional burden about whether or not they are making the right decisions based on your wishes.  Follow the link  provided in your after visit summary or read over the paperwork we have mailed to you to help you started getting your Advance Directives in place. If you need assistance in completing these, please reach out to us  so that we can help you!  See attachments for Preventive Care and Fall Prevention Tips.   Fall Prevention in the Home, Adult Falls can cause injuries and affect people of all ages. There are many simple things that you can do to make your home safe and to help prevent falls. If you need it, ask for help making these changes. What actions can I take to prevent falls? General information Use good lighting in all rooms. Make sure to: Replace any light bulbs that burn out. Turn on lights if it is dark and use night-lights. Keep items that you use often in easy-to-reach places. Lower the shelves around your home if needed. Move furniture so that there are clear paths around it. Do not keep throw rugs or other things on the floor that can make you trip. If any of your floors are uneven, fix them. Add color or contrast paint or tape to clearly mark and help you see: Grab bars or handrails. First and last steps of staircases. Where the edge of each step is. If you use a ladder or stepladder: Make sure that it is fully opened. Do not  climb a closed ladder. Make sure the sides of the ladder are locked in place. Have someone hold the ladder while you use it. Know where your pets are as you move through your home. What can I do in the bathroom?     Keep the floor dry. Clean up any water that is on the floor right away. Remove soap buildup in the bathtub or shower. Buildup makes bathtubs and showers slippery. Use non-skid mats or decals on the floor of the bathtub or shower. Attach bath mats securely with double-sided, non-slip rug tape. If you need to sit down while you are in the shower, use a non-slip stool. Install grab bars by the toilet and in the bathtub and shower. Do not use  towel bars as grab bars. What can I do in the bedroom? Make sure that you have a light by your bed that is easy to reach. Do not use any sheets or blankets on your bed that hang to the floor. Have a firm bench or chair with side arms that you can use for support when you get dressed. What can I do in the kitchen? Clean up any spills right away. If you need to reach something above you, use a sturdy step stool that has a grab bar. Keep electrical cables out of the way. Do not use floor polish or wax that makes floors slippery. What can I do with my stairs? Do not leave anything on the stairs. Make sure that you have a light switch at the top and the bottom of the stairs. Have them installed if you do not have them. Make sure that there are handrails on both sides of the stairs. Fix handrails that are broken or loose. Make sure that handrails are as long as the staircases. Install non-slip stair treads on all stairs in your home if they do not have carpet. Avoid having throw rugs at the top or bottom of stairs, or secure the rugs with carpet tape to prevent them from moving. Choose a carpet design that does not hide the edge of steps on the stairs. Make sure that carpet is firmly attached to the stairs. Fix any carpet that is loose or worn. What can I do on the outside of my home? Use bright outdoor lighting. Repair the edges of walkways and driveways and fix any cracks. Clear paths of anything that can make you trip, such as tools or rocks. Add color or contrast paint or tape to clearly mark and help you see high doorway thresholds. Trim any bushes or trees on the main path into your home. Check that handrails are securely fastened and in good repair. Both sides of all steps should have handrails. Install guardrails along the edges of any raised decks or porches. Have leaves, snow, and ice cleared regularly. Use sand, salt, or ice melt on walkways during winter months if you live where there  is ice and snow. In the garage, clean up any spills right away, including grease or oil spills. What other actions can I take? Review your medicines with your health care provider. Some medicines can make you confused or feel dizzy. This can increase your chance of falling. Wear closed-toe shoes that fit well and support your feet. Wear shoes that have rubber soles and low heels. Use a cane, walker, scooter, or crutches that help you move around if needed. Talk with your provider about other ways that you can decrease your risk of falls. This may include  seeing a physical therapist to learn to do exercises to improve movement and strength. Where to find more information Centers for Disease Control and Prevention, [employer]: TonerPromos.no [employer] on Aging: BaseRingTones.pl National Institute on Aging: BaseRingTones.pl Contact a health care provider if: You are afraid of falling at home. You feel weak, drowsy, or dizzy at home. You fall at home. Get help right away if you: Lose consciousness or have trouble moving after a fall. Have a fall that causes a head injury. These symptoms may be an emergency. Get help right away. Call 911. Do not wait to see if the symptoms will go away. Do not drive yourself to the hospital. This information is not intended to replace advice given to you by your health care provider. Make sure you discuss any questions you have with your health care provider. Document Revised: 12/20/2021 Document Reviewed: 12/20/2021 Elsevier Patient Education  2024 [employer].

## 2023-12-27 NOTE — Progress Notes (Signed)
 Subjective:   Dana [name] is a 78 y.o. who presents for a Medicare Wellness preventive visit.  As a reminder, Annual Wellness Visits don't include a physical exam, and some assessments may be limited, especially if this visit is performed virtually. We may recommend an in-person follow-up visit with your provider if needed.  Visit Complete: Virtual I connected with  [place] on 12/27/23 by a audio enabled telemedicine application and verified that I am speaking with the correct person using two identifiers.  Patient Location: Home  Provider Location: Home Office  I discussed the limitations of evaluation and management by telemedicine. The patient expressed understanding and agreed to proceed.  Vital Signs: Because this visit was a virtual/telehealth visit, some criteria may be missing or patient reported. Any vitals not documented were not able to be obtained and vitals that have been documented are patient reported.  VideoDeclined- This patient declined Librarian, academic. Therefore the visit was completed with audio only.  Persons Participating in Visit: Patient.  AWV Questionnaire: No: Patient Medicare AWV questionnaire was not completed prior to this visit.  Cardiac Risk Factors include: advanced age (>42men, >72 women);smoking/ tobacco exposure;obesity (BMI >30kg/m2);Other (see comment);sedentary lifestyle, Risk factor comments: CAD     Objective:    Today's Vitals   12/27/23 1445  Weight: 174 lb (78.9 kg)  Height: 5' (1.524 m)   Body mass index is 33.98 kg/m.     12/27/2023    3:04 PM 12/21/2022    2:37 PM 10/05/2021    1:24 PM 08/19/2021    3:54 PM 07/22/2021    1:59 PM 01/28/2021    3:54 PM 01/17/2021    2:29 PM  Advanced Directives  Does Patient Have a Medical Advance Directive? No No No No No No No  Would patient like information on creating a medical advance directive? Yes (MAU/Ambulatory/Procedural Areas - Information  given) No - Patient declined No - Patient declined No - Patient declined No - Patient declined No - Patient declined No - Patient declined    Current Medications (verified) Outpatient Encounter Medications as of 12/27/2023  Medication Sig   acetaminophen  (TYLENOL ) 325 MG tablet Take 2 tablets (650 mg total) by mouth every 4 (four) hours as needed for headache or mild pain.   albuterol  (VENTOLIN  HFA) 108 (90 Base) MCG/ACT inhaler TAKE 2 PUFFS BY MOUTH EVERY 6 HOURS AS NEEDED FOR WHEEZE OR SHORTNESS OF BREATH   aspirin  81 MG chewable tablet Chew 1 tablet (81 mg total) by mouth daily.   atorvastatin  (LIPITOR ) 80 MG tablet TAKE 1 TABLET BY MOUTH EVERY DAY   bisoprolol  (ZEBETA ) 5 MG tablet Take 1 tablet (5 mg total) by mouth daily.   empagliflozin  (JARDIANCE ) 10 MG TABS tablet TAKE 1 TABLET BY MOUTH EVERY DAY   ENTRESTO  24-26 MG TAKE 1 TABLET BY MOUTH TWICE A DAY   isosorbide  mononitrate (IMDUR ) 30 MG 24 hr tablet Take 1 tablet (30 mg total) by mouth daily.   levothyroxine  (SYNTHROID ) 100 MCG tablet TAKE 1 TABLET BY MOUTH DAILY BEFORE BREAKFAST.   spironolactone  (ALDACTONE ) 25 MG tablet Take 1 tablet (25 mg total) by mouth daily.   Tiotropium Bromide -Olodaterol (STIOLTO RESPIMAT ) 2.5-2.5 MCG/ACT AERS Inhale 2 puffs into the lungs daily at 6 (six) AM.   torsemide  (DEMADEX ) 20 MG tablet Take 1 tablet (20 mg total) by mouth 4 (four) times a week. Take Monday, Wednesday, Friday, Saturday. (Patient taking differently: Take 20 mg by mouth 4 (four) times a week. Take  Monday, Wednesday, Friday, Saturday. Taking PRN)   metoprolol  succinate (TOPROL -XL) 25 MG 24 hr tablet Take 12.5 mg by mouth daily.   No facility-administered encounter medications on file as of 12/27/2023.    Allergies (verified) Bevespi  aerosphere [glycopyrrolate -formoterol]   History: Past Medical History:  Diagnosis Date   CHF (congestive heart failure) (HCC)    Complication of anesthesia    difficulty waking up, it was all day  long   Coronary artery disease    Hyperlipidemia    LBBB (left bundle branch block)    NSTEMI (non-ST elevated myocardial infarction) (HCC)    Psoriasis    Past Surgical History:  Procedure Laterality Date   BIV ICD INSERTION CRT-D N/A 10/05/2021   Procedure: BIV ICD INSERTION CRT-D;  Surgeon: Cindie Ole DASEN, MD;  [facility] INVASIVE CV LAB;  Service: Cardiovascular;  Laterality: N/A;   CARDIAC CATHETERIZATION  02/17/2012   CORONARY ARTERY BYPASS GRAFT  02/23/2012   Procedure: CORONARY ARTERY BYPASS GRAFTING (CABG);  Surgeon: Elspeth JAYSON Millers, MD;  Location: [facility] OR;  Service: Open Heart Surgery;  Laterality: N/A;  coronary artery bypass graft on pump times two utlizing left internal mammary artery and right greater saphenous vein harvested endoscopically, transesophageal echocardiogram    NO PAST SURGERIES     RIGHT/LEFT HEART CATH AND CORONARY/GRAFT ANGIOGRAPHY N/A 07/30/2018   Procedure: RIGHT/LEFT HEART CATH AND CORONARY/GRAFT ANGIOGRAPHY;  Surgeon: Darron Deatrice LABOR, MD;  Location: [facility] INVASIVE CV LAB;  Service: Cardiovascular;  Laterality: N/A;   TEE WITHOUT CARDIOVERSION  02/22/2012   Procedure: TRANSESOPHAGEAL ECHOCARDIOGRAM (TEE);  Surgeon: Toribio JONELLE Fuel, MD;  Location: [facility] ENDOSCOPY;  Service: Cardiovascular;  Laterality: N/A;   Family History  Problem Relation Age of Onset   Diabetes Mother    Alzheimer's disease Mother    Diabetes Father    Heart disease Father    Social History   Socioeconomic History   Marital status: Divorced    Spouse name: Not on file   Number of children: 4   Years of education: Not on file   Highest education level: Not on file  Occupational History   Occupation: retired  Tobacco Use   Smoking status: Some Days    Current packs/day: 0.20    Average packs/day: 0.2 packs/day for 65.7 years (13.1 ttl pk-yrs)    Types: Cigarettes    Start date: 1960   Smokeless tobacco: Never   Tobacco comments:    Smokes one and half packs of  cigarettes weekly      12/27/23 smokes 1 cigarette some days, 1 pack lasts 2 weeks/pbt  Vaping Use   Vaping status: Never Used  Substance and Sexual Activity   Alcohol use: No   Drug use: No   Sexual activity: Not on file  Other Topics Concern   Not on file  Social History Narrative   Not on file   Social Drivers of Health   Financial Resource Strain: Low Risk  (12/27/2023)   Overall Financial Resource Strain (CARDIA)    Difficulty of Paying Living Expenses: Not hard at all  Food Insecurity: No Food Insecurity (12/27/2023)   Hunger Vital Sign    Worried About Running Out of Food in the Last Year: Never true    Ran Out of Food in the Last Year: Never true  Transportation Needs: No Transportation Needs (12/27/2023)   PRAPARE - Administrator, Civil Service (Medical): No    Lack of Transportation (Non-Medical): No  Physical Activity: Inactive (12/27/2023)  Exercise Vital Sign    Days of Exercise per Week: 0 days    Minutes of Exercise per Session: 0 min  Stress: Patient Unable To Answer (12/27/2023)   [employer] of Occupational Health - Occupational Stress Questionnaire    Feeling of Stress: Patient unable to answer  Social Connections: Socially Isolated (12/27/2023)   Social Connection and Isolation Panel    Frequency of Communication with Friends and Family: More than three times a week    Frequency of Social Gatherings with Friends and Family: Twice a week    Attends Religious Services: Never    Database administrator or Organizations: No    Attends Engineer, structural: Never    Marital Status: Divorced    Tobacco Counseling Ready to quit: Not Answered Counseling given: Not Answered Tobacco comments: Smokes one and half packs of cigarettes weekly   12/27/23 smokes 1 cigarette some days, 1 pack lasts 2 weeks/pbt    Clinical Intake:  Pre-visit preparation completed: Yes  Pain : No/denies pain     BMI - recorded: 33.98 Nutritional Status:  BMI > 30  Obese Nutritional Risks: None Diabetes: No  Lab Results  Component Value Date   HGBA1C 5.5 07/26/2018   HGBA1C 5.4 02/23/2012   HGBA1C 5.4 02/17/2012     How often do you need to have someone help you when you read instructions, pamphlets, or other written materials from your doctor or pharmacy?: 1 - Never  Interpreter Needed?: No  Information entered by :: Vina Ned, CMA   Activities of Daily Living     12/27/2023    2:47 PM 01/31/2023    1:13 PM  In your present state of health, do you have any difficulty performing the following activities:  Hearing? 1 1  Comment wears hearing aids   Vision? 0 0  Difficulty concentrating or making decisions? 0 0  Walking or climbing stairs? 1 1  Comment due to arthritis in knees   Dressing or bathing? 0 0  Doing errands, shopping? 0 1  Comment daughter alway goes to appointments   Preparing Food and eating ? N   Using the Toilet? N   In the past six months, have you accidently leaked urine? Y   Comment wears pad   Do you have problems with loss of bowel control? N   Managing your Medications? N   Managing your Finances? N   Housekeeping or managing your Housekeeping? N     Patient Care Team: Justus Leita DEL, MD as PCP - General (Internal Medicine) Perla Evalene PARAS, MD as Consulting Physician (Cardiology) Sharlot Agent, OD (Optometry) Cindie Ole DASEN, MD as Consulting Physician (Cardiology)  I have updated your Care Teams any recent Medical Services you may have received from other providers in the past year.     Assessment:   This is a routine wellness examination for [place].  Hearing/Vision screen Hearing Screening - Comments:: Wears hearing aids Vision Screening - Comments:: Gets routine eye exams, Dr. Agent Sharlot, [facility] [place]   Goals Addressed             This Visit's Progress    Patient Stated       Legs be better, so I can do more       Depression Screen     12/27/2023    3:01  PM 12/12/2023    4:08 PM 08/07/2023    4:12 PM 05/08/2023    4:21 PM 01/31/2023    1:13  PM 12/21/2022    2:35 PM 11/15/2022    3:37 PM  PHQ 2/9 Scores  PHQ - 2 Score 0 0 0 0 0 0 0  PHQ- 9 Score 0 1 1 4 2  0 2    Fall Risk     12/27/2023    3:05 PM 12/12/2023    4:08 PM 05/08/2023    4:20 PM 01/31/2023    1:13 PM 12/21/2022    2:37 PM  Fall Risk   Falls in the past year? 0 0 0 0 0  Number falls in past yr: 0 0 0  0  Injury with Fall? 0 0 0  0  Risk for fall due to : No Fall Risks No Fall Risks No Fall Risks Impaired balance/gait;Orthopedic patient No Fall Risks  Follow up Falls evaluation completed Falls evaluation completed Falls evaluation completed Falls prevention discussed;Education provided;Falls evaluation completed Falls prevention discussed;Falls evaluation completed    MEDICARE RISK AT HOME:  Medicare Risk at Home Any stairs in or around the home?: Yes If so, are there any without handrails?: No Home free of loose throw rugs in walkways, pet beds, electrical cords, etc?: Yes Adequate lighting in your home to reduce risk of falls?: Yes Life alert?: No Use of a cane, walker or w/c?: No Grab bars in the bathroom?: Yes Shower chair or bench in shower?: No Elevated toilet seat or a handicapped toilet?: No  TIMED UP AND GO:  Was the test performed?  No  Cognitive Function: 6CIT completed        12/27/2023    3:06 PM 12/21/2022    2:39 PM  6CIT Screen  What Year? 0 points 0 points  What month? 0 points 0 points  What time? 0 points 0 points  Count back from 20 0 points 0 points  Months in reverse 0 points 0 points  Repeat phrase 0 points 0 points  Total Score 0 points 0 points    Immunizations Immunization History  Administered Date(s) Administered   Fluad Quad(high Dose 65+) 01/28/2021   Fluad Trivalent(High Dose 65+) 01/31/2023   Moderna Covid-19 Vaccine Bivalent Booster 36yrs & up 03/10/2022   PNEUMOCOCCAL CONJUGATE-20 11/15/2022   Pfizer(Comirnaty)Fall Seasonal  Vaccine 12 years and older 01/31/2023   Zoster, Live 03/09/2022    Screening Tests Health Maintenance  Topic Date Due   Zoster Vaccines- Shingrix (1 of 2) 08[birth year]06/1964   COVID-19 Vaccine (3 - Mixed Product risk series) 02/28/2023   INFLUENZA VACCINE  12/01/2023   Medicare Annual Wellness (AWV)  12/26/2024   DEXA SCAN  02/13/2025   Pneumococcal Vaccine: 50+ Years  Completed   HPV VACCINES  Aged Out   Meningococcal B Vaccine  Aged Out   DTaP/Tdap/Td  Discontinued   Hepatitis C Screening  Discontinued    Health Maintenance  Health Maintenance Due  Topic Date Due   Zoster Vaccines- Shingrix (1 of 2) 08/06/1962   COVID-19 Vaccine (3 - Mixed Product risk series) 02/28/2023   INFLUENZA VACCINE  12/01/2023   Health Maintenance Items Addressed: See Nurse Notes at the end of this note  Additional Screening:  Vision Screening: Recommended annual ophthalmology exams for early detection of glaucoma and other disorders of the eye. Would you like a referral to an eye doctor? No    Dental Screening: Recommended annual dental exams for proper oral hygiene  Community Resource Referral / Chronic Care Management: CRR required this visit?  No   CCM required this visit?  No   Plan:  I have personally reviewed and noted the following in the patient's chart:   Medical and social history Use of alcohol, tobacco or illicit drugs  Current medications and supplements including opioid prescriptions. Patient is not currently taking opioid prescriptions. Functional ability and status Nutritional status Physical activity Advanced directives List of other physicians Hospitalizations, surgeries, and ER visits in previous 12 months Vitals Screenings to include cognitive, depression, and falls Referrals and appointments  In addition, I have reviewed and discussed with patient certain preventive protocols, quality metrics, and best practice recommendations. A written personalized care plan  for preventive services as well as general preventive health recommendations were provided to patient.   Vina Ned, CMA   12/27/2023   After Visit Summary: (Mail) Due to this being a telephonic visit, the after visit summary with patients personalized plan was offered to patient via mail   Notes:  Will get flu shot at local pharmacy Declined Shingrix and Covid vaccines Screening colonoscopy and MMG no longer recommended due to age

## 2023-12-27 NOTE — Progress Notes (Signed)
 Remote ICD transmission.

## 2024-01-17 ENCOUNTER — Ambulatory Visit (INDEPENDENT_AMBULATORY_CARE_PROVIDER_SITE_OTHER): Payer: Medicare HMO

## 2024-01-17 DIAGNOSIS — I255 Ischemic cardiomyopathy: Secondary | ICD-10-CM

## 2024-01-17 LAB — CUP PACEART REMOTE DEVICE CHECK
Battery Remaining Longevity: 29 mo
Battery Remaining Percentage: 54 %
Battery Voltage: 2.93 V
Brady Statistic AP VP Percent: 41 %
Brady Statistic AP VS Percent: 1 %
Brady Statistic AS VP Percent: 59 %
Brady Statistic AS VS Percent: 1 %
Brady Statistic RA Percent Paced: 41 %
Date Time Interrogation Session: 20250917020018
HighPow Impedance: 91 Ohm
HighPow Impedance: 91 Ohm
Implantable Lead Connection Status: 753985
Implantable Lead Connection Status: 753985
Implantable Lead Connection Status: 753985
Implantable Lead Implant Date: 20131024
Implantable Lead Implant Date: 20230606
Implantable Lead Implant Date: 20230606
Implantable Lead Location: 753859
Implantable Lead Location: 753860
Implantable Lead Location: 753862
Implantable Lead Model: 5071
Implantable Pulse Generator Implant Date: 20230606
Lead Channel Impedance Value: 300 Ohm
Lead Channel Impedance Value: 400 Ohm
Lead Channel Impedance Value: 700 Ohm
Lead Channel Pacing Threshold Amplitude: 0.625 V
Lead Channel Pacing Threshold Amplitude: 0.75 V
Lead Channel Pacing Threshold Amplitude: 2.5 V
Lead Channel Pacing Threshold Pulse Width: 0.5 ms
Lead Channel Pacing Threshold Pulse Width: 0.5 ms
Lead Channel Pacing Threshold Pulse Width: 1 ms
Lead Channel Sensing Intrinsic Amplitude: 12 mV
Lead Channel Sensing Intrinsic Amplitude: 2.6 mV
Lead Channel Setting Pacing Amplitude: 1.625
Lead Channel Setting Pacing Amplitude: 2 V
Lead Channel Setting Pacing Amplitude: 3 V
Lead Channel Setting Pacing Pulse Width: 0.5 ms
Lead Channel Setting Pacing Pulse Width: 1 ms
Lead Channel Setting Sensing Sensitivity: 0.5 mV
Pulse Gen Serial Number: 5538587
Zone Setting Status: 755011

## 2024-01-18 ENCOUNTER — Ambulatory Visit: Payer: Self-pay | Admitting: Cardiology

## 2024-01-22 NOTE — Progress Notes (Signed)
Remote ICD Transmission.

## 2024-03-28 ENCOUNTER — Other Ambulatory Visit: Payer: Self-pay | Admitting: Cardiology

## 2024-04-01 NOTE — Progress Notes (Unsigned)
  Electrophysiology Office Follow up Visit Note:    Date:  04/02/2024   ID:  Dana [name], DOB 02-Jan-1946, MRN [id number]  PCP:  Lemon Raisin, MD  [facility] Cardiologist:  None  [facility] Electrophysiologist:  None    Interval History:     Dana [name] is a 78 y.o. female who presents for a follow up visit.   The patient last saw Elvie March 24, 2023.  The patient has a history of chronic systolic heart failure, left bundle branch block, CRT-D in situ, coronary artery disease with prior CABG. She is with her daughter today in clinic.  She is doing okay.  No problems with her device.       Past medical, surgical, social and family history were reviewed.  ROS:   Please see the history of present illness.    All other systems reviewed and are negative.  EKGs/Labs/Other Studies Reviewed:    The following studies were reviewed today:          Physical Exam:    VS:  BP (!) 102/54   Pulse 70   Ht 5' (1.524 m)   Wt 171 lb (77.6 kg)   SpO2 93%   BMI 33.40 kg/m     Wt Readings from Last 3 Encounters:  04/02/24 171 lb (77.6 kg)  12/27/23 174 lb (78.9 kg)  12/12/23 177 lb (80.3 kg)     GEN: no distress CARD: RRR, No MRG RESP: No IWOB. CTAB.      ASSESSMENT:    1. Chronic combined systolic and diastolic congestive heart failure (HCC)   2. Cardiac resynchronization therapy defibrillator (CRT-D) in place    PLAN:    In order of problems listed above:  #Chronic systolic heart failure #Left bundle branch block #CRT-D in situ NYHA class II-III.  Warm and dry on exam. Last echo in 2023 demonstrated improvement of EF to 30% from previously 10%. Repeat echocardiogram CRT-D functioning appropriately.  Continue remote monitoring  Continue torsemide , spironolactone , metoprolol , Imdur , Entresto , Jardiance , bisoprolol   I discussed my upcoming departure from [facility] during today's clinic appointment.  The patient will continue to  follow-up with one of my EP partners moving forward.  Follow-up 1 year with EP APP     Signed, Ole Holts, MD, [facility], [facility] 04/02/2024 2:38 PM    Electrophysiology [facility]

## 2024-04-02 ENCOUNTER — Encounter: Payer: Self-pay | Admitting: Cardiology

## 2024-04-02 ENCOUNTER — Ambulatory Visit: Attending: Cardiology | Admitting: Cardiology

## 2024-04-02 VITALS — BP 102/54 | HR 70 | Ht 60.0 in | Wt 171.0 lb

## 2024-04-02 DIAGNOSIS — I447 Left bundle-branch block, unspecified: Secondary | ICD-10-CM | POA: Diagnosis not present

## 2024-04-02 DIAGNOSIS — Z95810 Presence of automatic (implantable) cardiac defibrillator: Secondary | ICD-10-CM | POA: Diagnosis not present

## 2024-04-02 DIAGNOSIS — I252 Old myocardial infarction: Secondary | ICD-10-CM

## 2024-04-02 DIAGNOSIS — I5042 Chronic combined systolic (congestive) and diastolic (congestive) heart failure: Secondary | ICD-10-CM

## 2024-04-02 DIAGNOSIS — I2510 Atherosclerotic heart disease of native coronary artery without angina pectoris: Secondary | ICD-10-CM | POA: Diagnosis not present

## 2024-04-02 LAB — CUP PACEART INCLINIC DEVICE CHECK
Battery Remaining Longevity: 26 mo
Brady Statistic RA Percent Paced: 38 %
Brady Statistic RV Percent Paced: 99.89 %
Date Time Interrogation Session: 20251202145017
HighPow Impedance: 83.25 Ohm
Implantable Lead Connection Status: 753985
Implantable Lead Connection Status: 753985
Implantable Lead Connection Status: 753985
Implantable Lead Implant Date: 20131024
Implantable Lead Implant Date: 20230606
Implantable Lead Implant Date: 20230606
Implantable Lead Location: 753858
Implantable Lead Location: 753859
Implantable Lead Location: 753860
Implantable Lead Model: 5071
Implantable Pulse Generator Implant Date: 20230606
Lead Channel Impedance Value: 337.5 Ohm
Lead Channel Impedance Value: 425 Ohm
Lead Channel Impedance Value: 787.5 Ohm
Lead Channel Pacing Threshold Amplitude: 0.75 V
Lead Channel Pacing Threshold Amplitude: 0.75 V
Lead Channel Pacing Threshold Amplitude: 0.75 V
Lead Channel Pacing Threshold Amplitude: 1.5 V
Lead Channel Pacing Threshold Amplitude: 1.5 V
Lead Channel Pacing Threshold Pulse Width: 0.5 ms
Lead Channel Pacing Threshold Pulse Width: 0.5 ms
Lead Channel Pacing Threshold Pulse Width: 0.5 ms
Lead Channel Pacing Threshold Pulse Width: 1 ms
Lead Channel Pacing Threshold Pulse Width: 1 ms
Lead Channel Sensing Intrinsic Amplitude: 12 mV
Lead Channel Sensing Intrinsic Amplitude: 3.2 mV
Lead Channel Setting Pacing Amplitude: 2 V
Lead Channel Setting Pacing Amplitude: 3 V
Lead Channel Setting Pacing Amplitude: 3.125
Lead Channel Setting Pacing Pulse Width: 0.5 ms
Lead Channel Setting Pacing Pulse Width: 1 ms
Lead Channel Setting Sensing Sensitivity: 0.5 mV
Pulse Gen Serial Number: 5538587
Zone Setting Status: 755011

## 2024-04-02 NOTE — Patient Instructions (Signed)
 Medication Instructions:  Your physician recommends that you continue on your current medications as directed. Please refer to the Current Medication list given to you today.   *If you need a refill on your cardiac medications before your next appointment, please call your pharmacy*  Lab Work: None ordered at this time   Testing/Procedures: Your physician has requested that you have an echocardiogram. Echocardiography is a painless test that uses sound waves to create images of your heart. It provides your doctor with information about the size and shape of your heart and how well your heart's chambers and valves are working.   You may receive an ultrasound enhancing agent through an IV if needed to better visualize your heart during the echo. This procedure takes approximately one hour.  There are no restrictions for this procedure.  This will take place at [address] [facility] [address], [place] [address]  Please note: We ask at that you not bring children with you during ultrasound (echo/ vascular) testing. Due to room size and safety concerns, children are not allowed in the ultrasound rooms during exams. Our front office staff cannot provide observation of children in our lobby area while testing is being conducted. An adult accompanying a patient to their appointment will only be allowed in the ultrasound room at the discretion of the ultrasound technician under special circumstances. We apologize for any inconvenience.  Follow-Up: At [facility], you and your health needs are our priority.  As part of our continuing mission to provide you with exceptional heart care, our providers are all part of one team.  This team includes your primary Cardiologist (physician) and Advanced Practice Providers or APPs (Physician Assistants and Nurse Practitioners) who all work together to provide you with the care you need, when you need it.  Your next appointment:   1  year(s)  Provider:   You may see Sidra Kitty, MD or one of the following Advanced Practice Providers on your designated Care Team:   Charlies Arthur, PA-C Michael Andy Tillery, PA-C Suzann Riddle, NP Daphne Barrack, NP Artist Pouch, PA-C     We recommend signing up for the patient portal called MyChart.  Sign up information is provided on this After Visit Summary.  MyChart is used to connect with patients for Virtual Visits (Telemedicine).  Patients are able to view lab/test results, encounter notes, upcoming appointments, etc.  Non-urgent messages can be sent to your provider as well.   To learn more about what you can do with MyChart, go to [email or web address].

## 2024-04-07 ENCOUNTER — Ambulatory Visit: Payer: Self-pay | Admitting: Cardiology

## 2024-04-17 ENCOUNTER — Ambulatory Visit: Payer: Medicare HMO

## 2024-04-17 DIAGNOSIS — I255 Ischemic cardiomyopathy: Secondary | ICD-10-CM

## 2024-04-18 LAB — CUP PACEART REMOTE DEVICE CHECK
Battery Remaining Longevity: 25 mo
Battery Remaining Percentage: 49 %
Battery Voltage: 2.93 V
Brady Statistic AP VP Percent: 19 %
Brady Statistic AP VS Percent: 1 %
Brady Statistic AS VP Percent: 81 %
Brady Statistic AS VS Percent: 1 %
Brady Statistic RA Percent Paced: 19 %
Date Time Interrogation Session: 20251217020016
HighPow Impedance: 86 Ohm
HighPow Impedance: 86 Ohm
Implantable Lead Connection Status: 753985
Implantable Lead Connection Status: 753985
Implantable Lead Connection Status: 753985
Implantable Lead Implant Date: 20131024
Implantable Lead Implant Date: 20230606
Implantable Lead Implant Date: 20230606
Implantable Lead Location: 753858
Implantable Lead Location: 753859
Implantable Lead Location: 753860
Implantable Lead Model: 5071
Implantable Pulse Generator Implant Date: 20230606
Lead Channel Impedance Value: 340 Ohm
Lead Channel Impedance Value: 400 Ohm
Lead Channel Impedance Value: 750 Ohm
Lead Channel Pacing Threshold Amplitude: 0.75 V
Lead Channel Pacing Threshold Amplitude: 1.5 V
Lead Channel Pacing Threshold Amplitude: 1.875 V
Lead Channel Pacing Threshold Pulse Width: 0.5 ms
Lead Channel Pacing Threshold Pulse Width: 0.5 ms
Lead Channel Pacing Threshold Pulse Width: 1 ms
Lead Channel Sensing Intrinsic Amplitude: 12 mV
Lead Channel Sensing Intrinsic Amplitude: 2.4 mV
Lead Channel Setting Pacing Amplitude: 2 V
Lead Channel Setting Pacing Amplitude: 3 V
Lead Channel Setting Pacing Amplitude: 3.375
Lead Channel Setting Pacing Pulse Width: 0.5 ms
Lead Channel Setting Pacing Pulse Width: 1 ms
Lead Channel Setting Sensing Sensitivity: 0.5 mV
Pulse Gen Serial Number: 5538587
Zone Setting Status: 755011

## 2024-04-18 NOTE — Progress Notes (Signed)
 Remote ICD Transmission

## 2024-05-08 ENCOUNTER — Ambulatory Visit

## 2024-05-16 ENCOUNTER — Other Ambulatory Visit: Payer: Self-pay | Admitting: Cardiology

## 2024-05-17 MED ORDER — ATORVASTATIN CALCIUM 80 MG PO TABS: ORAL_TABLET | ORAL | 3 refills | Status: AC

## 2024-05-17 NOTE — Telephone Encounter (Signed)
 Pt had a lipid panel done on 12/12/2023. Pt's medication was sent to pt's pharmacy as requested. Confirmation received.

## 2024-05-20 ENCOUNTER — Ambulatory Visit

## 2024-06-03 ENCOUNTER — Ambulatory Visit: Admitting: Cardiovascular Disease

## 2024-06-05 ENCOUNTER — Other Ambulatory Visit: Payer: Self-pay | Admitting: Cardiology

## 2024-06-05 ENCOUNTER — Other Ambulatory Visit: Payer: Self-pay | Admitting: Cardiovascular Disease

## 2024-06-05 NOTE — Telephone Encounter (Signed)
 In accordance with refill protocols, please review and address the following requirements before this medication refill can be authorized:  Labs     Cr in normal range and within 365 days

## 2024-06-18 ENCOUNTER — Encounter: Admitting: Student

## 2024-07-01 ENCOUNTER — Ambulatory Visit: Admitting: Cardiovascular Disease

## 2025-01-01 ENCOUNTER — Ambulatory Visit
